# Patient Record
Sex: Male | Born: 1960
Health system: Southern US, Community
[De-identification: ages and names within clinical notes are randomized; demographics above are authoritative.]

## PROBLEM LIST (undated history)

## (undated) DIAGNOSIS — E291 Testicular hypofunction: Secondary | ICD-10-CM

## (undated) DIAGNOSIS — F200 Paranoid schizophrenia: Secondary | ICD-10-CM

## (undated) DIAGNOSIS — I10 Essential (primary) hypertension: Secondary | ICD-10-CM

## (undated) DIAGNOSIS — K2270 Barrett's esophagus without dysplasia: Secondary | ICD-10-CM

## (undated) DIAGNOSIS — M810 Age-related osteoporosis without current pathological fracture: Secondary | ICD-10-CM

## (undated) DIAGNOSIS — R413 Other amnesia: Secondary | ICD-10-CM

## (undated) DIAGNOSIS — G939 Disorder of brain, unspecified: Secondary | ICD-10-CM

## (undated) DIAGNOSIS — E119 Type 2 diabetes mellitus without complications: Secondary | ICD-10-CM

## (undated) DIAGNOSIS — R7303 Prediabetes: Secondary | ICD-10-CM

## (undated) DIAGNOSIS — K219 Gastro-esophageal reflux disease without esophagitis: Secondary | ICD-10-CM

## (undated) DIAGNOSIS — N189 Chronic kidney disease, unspecified: Secondary | ICD-10-CM

## (undated) DIAGNOSIS — R42 Dizziness and giddiness: Secondary | ICD-10-CM

## (undated) DIAGNOSIS — E785 Hyperlipidemia, unspecified: Secondary | ICD-10-CM

## (undated) DIAGNOSIS — D649 Anemia, unspecified: Secondary | ICD-10-CM

## (undated) DIAGNOSIS — F959 Tic disorder, unspecified: Secondary | ICD-10-CM

## (undated) HISTORY — DX: Disorder of brain, unspecified: G93.9

## (undated) HISTORY — DX: Barrett's esophagus without dysplasia: K22.70

## (undated) HISTORY — DX: Age-related osteoporosis without current pathological fracture: M81.0

## (undated) HISTORY — PX: COLONOSCOPY: SHX174

## (undated) HISTORY — DX: Hyperlipidemia, unspecified: E78.5

## (undated) HISTORY — DX: Type 2 diabetes mellitus without complications: E11.9

## (undated) HISTORY — DX: Essential (primary) hypertension: I10

## (undated) HISTORY — DX: Anemia, unspecified: D64.9

## (undated) HISTORY — DX: Paranoid schizophrenia: F20.0

## (undated) HISTORY — DX: Testicular hypofunction: E29.1

## (undated) HISTORY — DX: Chronic kidney disease, unspecified: N18.9

## (undated) HISTORY — DX: Gastro-esophageal reflux disease without esophagitis: K21.9

---

## 2007-10-30 ENCOUNTER — Ambulatory Visit: Payer: Self-pay | Admitting: Gastroenterology

## 2012-02-10 DIAGNOSIS — E119 Type 2 diabetes mellitus without complications: Secondary | ICD-10-CM | POA: Diagnosis not present

## 2012-02-10 DIAGNOSIS — I10 Essential (primary) hypertension: Secondary | ICD-10-CM | POA: Diagnosis not present

## 2012-02-10 DIAGNOSIS — E785 Hyperlipidemia, unspecified: Secondary | ICD-10-CM | POA: Diagnosis not present

## 2012-02-10 DIAGNOSIS — F209 Schizophrenia, unspecified: Secondary | ICD-10-CM | POA: Diagnosis not present

## 2012-06-14 DIAGNOSIS — E119 Type 2 diabetes mellitus without complications: Secondary | ICD-10-CM | POA: Diagnosis not present

## 2012-06-14 DIAGNOSIS — I10 Essential (primary) hypertension: Secondary | ICD-10-CM | POA: Diagnosis not present

## 2012-06-14 DIAGNOSIS — E785 Hyperlipidemia, unspecified: Secondary | ICD-10-CM | POA: Diagnosis not present

## 2013-02-19 DIAGNOSIS — I10 Essential (primary) hypertension: Secondary | ICD-10-CM | POA: Diagnosis not present

## 2013-02-19 DIAGNOSIS — E785 Hyperlipidemia, unspecified: Secondary | ICD-10-CM | POA: Diagnosis not present

## 2013-07-16 DIAGNOSIS — E291 Testicular hypofunction: Secondary | ICD-10-CM | POA: Diagnosis not present

## 2013-07-16 DIAGNOSIS — E119 Type 2 diabetes mellitus without complications: Secondary | ICD-10-CM | POA: Diagnosis not present

## 2013-07-16 DIAGNOSIS — I10 Essential (primary) hypertension: Secondary | ICD-10-CM | POA: Diagnosis not present

## 2013-07-16 DIAGNOSIS — Z0000 Encounter for general adult medical examination without abnormal findings: Secondary | ICD-10-CM | POA: Diagnosis not present

## 2013-07-16 DIAGNOSIS — E7800 Pure hypercholesterolemia, unspecified: Secondary | ICD-10-CM | POA: Diagnosis not present

## 2013-07-25 DIAGNOSIS — B351 Tinea unguium: Secondary | ICD-10-CM | POA: Diagnosis not present

## 2013-07-25 DIAGNOSIS — M79609 Pain in unspecified limb: Secondary | ICD-10-CM | POA: Diagnosis not present

## 2013-07-25 DIAGNOSIS — L600 Ingrowing nail: Secondary | ICD-10-CM | POA: Diagnosis not present

## 2013-10-29 DIAGNOSIS — R51 Headache: Secondary | ICD-10-CM | POA: Diagnosis not present

## 2013-10-29 DIAGNOSIS — IMO0001 Reserved for inherently not codable concepts without codable children: Secondary | ICD-10-CM | POA: Diagnosis not present

## 2013-10-29 DIAGNOSIS — E785 Hyperlipidemia, unspecified: Secondary | ICD-10-CM | POA: Diagnosis not present

## 2013-10-29 DIAGNOSIS — R55 Syncope and collapse: Secondary | ICD-10-CM | POA: Diagnosis not present

## 2014-01-15 DIAGNOSIS — Z23 Encounter for immunization: Secondary | ICD-10-CM | POA: Diagnosis not present

## 2014-08-21 ENCOUNTER — Other Ambulatory Visit: Payer: Self-pay | Admitting: Unknown Physician Specialty

## 2014-08-21 ENCOUNTER — Ambulatory Visit
Admission: RE | Admit: 2014-08-21 | Discharge: 2014-08-21 | Disposition: A | Discharge status: Home / Self Care | Payer: Medicare Other | Source: Ambulatory Visit | Attending: Unknown Physician Specialty | Admitting: Unknown Physician Specialty

## 2014-08-21 DIAGNOSIS — M1712 Unilateral primary osteoarthritis, left knee: Secondary | ICD-10-CM | POA: Diagnosis not present

## 2014-08-21 DIAGNOSIS — E119 Type 2 diabetes mellitus without complications: Secondary | ICD-10-CM | POA: Diagnosis not present

## 2014-08-21 DIAGNOSIS — M795 Residual foreign body in soft tissue: Secondary | ICD-10-CM | POA: Diagnosis not present

## 2014-08-21 DIAGNOSIS — M25562 Pain in left knee: Secondary | ICD-10-CM

## 2014-08-21 DIAGNOSIS — R358 Other polyuria: Secondary | ICD-10-CM | POA: Diagnosis not present

## 2014-08-21 DIAGNOSIS — G9009 Other idiopathic peripheral autonomic neuropathy: Secondary | ICD-10-CM | POA: Diagnosis not present

## 2014-08-21 DIAGNOSIS — E785 Hyperlipidemia, unspecified: Secondary | ICD-10-CM | POA: Diagnosis not present

## 2014-08-21 DIAGNOSIS — Z125 Encounter for screening for malignant neoplasm of prostate: Secondary | ICD-10-CM | POA: Diagnosis not present

## 2014-08-21 DIAGNOSIS — F0390 Unspecified dementia without behavioral disturbance: Secondary | ICD-10-CM | POA: Diagnosis not present

## 2014-08-21 DIAGNOSIS — I10 Essential (primary) hypertension: Secondary | ICD-10-CM | POA: Diagnosis not present

## 2014-08-21 DIAGNOSIS — N419 Inflammatory disease of prostate, unspecified: Secondary | ICD-10-CM | POA: Diagnosis not present

## 2014-09-11 DIAGNOSIS — E119 Type 2 diabetes mellitus without complications: Secondary | ICD-10-CM | POA: Diagnosis not present

## 2014-09-11 LAB — HM DIABETES EYE EXAM

## 2014-09-17 DIAGNOSIS — R42 Dizziness and giddiness: Secondary | ICD-10-CM | POA: Diagnosis not present

## 2014-09-17 DIAGNOSIS — R519 Headache, unspecified: Secondary | ICD-10-CM | POA: Insufficient documentation

## 2014-09-17 DIAGNOSIS — R413 Other amnesia: Secondary | ICD-10-CM | POA: Diagnosis not present

## 2014-09-17 DIAGNOSIS — R296 Repeated falls: Secondary | ICD-10-CM | POA: Insufficient documentation

## 2014-09-17 DIAGNOSIS — R51 Headache: Secondary | ICD-10-CM | POA: Diagnosis not present

## 2014-09-19 DIAGNOSIS — G939 Disorder of brain, unspecified: Secondary | ICD-10-CM | POA: Insufficient documentation

## 2014-09-19 DIAGNOSIS — E119 Type 2 diabetes mellitus without complications: Secondary | ICD-10-CM | POA: Insufficient documentation

## 2014-09-19 DIAGNOSIS — D649 Anemia, unspecified: Secondary | ICD-10-CM | POA: Insufficient documentation

## 2014-09-19 DIAGNOSIS — K2270 Barrett's esophagus without dysplasia: Secondary | ICD-10-CM | POA: Insufficient documentation

## 2014-09-19 DIAGNOSIS — M810 Age-related osteoporosis without current pathological fracture: Secondary | ICD-10-CM

## 2014-09-19 DIAGNOSIS — M1990 Unspecified osteoarthritis, unspecified site: Secondary | ICD-10-CM | POA: Insufficient documentation

## 2014-09-19 DIAGNOSIS — E785 Hyperlipidemia, unspecified: Secondary | ICD-10-CM | POA: Insufficient documentation

## 2014-09-19 DIAGNOSIS — F200 Paranoid schizophrenia: Secondary | ICD-10-CM | POA: Insufficient documentation

## 2014-09-19 DIAGNOSIS — N189 Chronic kidney disease, unspecified: Secondary | ICD-10-CM | POA: Insufficient documentation

## 2014-09-19 DIAGNOSIS — I10 Essential (primary) hypertension: Secondary | ICD-10-CM | POA: Insufficient documentation

## 2014-09-24 ENCOUNTER — Ambulatory Visit (INDEPENDENT_AMBULATORY_CARE_PROVIDER_SITE_OTHER): Payer: Medicare Other | Admitting: Unknown Physician Specialty

## 2014-09-24 ENCOUNTER — Encounter: Payer: Self-pay | Admitting: Unknown Physician Specialty

## 2014-09-24 VITALS — BP 125/79 | HR 69 | Temp 98.5°F | Ht 68.0 in | Wt 193.4 lb

## 2014-09-24 DIAGNOSIS — R358 Other polyuria: Secondary | ICD-10-CM

## 2014-09-24 DIAGNOSIS — R413 Other amnesia: Secondary | ICD-10-CM | POA: Diagnosis not present

## 2014-09-24 DIAGNOSIS — M25562 Pain in left knee: Secondary | ICD-10-CM | POA: Diagnosis not present

## 2014-09-24 DIAGNOSIS — R3589 Other polyuria: Secondary | ICD-10-CM | POA: Insufficient documentation

## 2014-09-24 NOTE — Progress Notes (Signed)
   BP 125/79 mmHg  Pulse 69  Temp(Src) 98.5 F (36.9 C) (Oral)  Ht 5\' 8"  (1.727 m)  Wt 193 lb 6.4 oz (87.726 kg)  BMI 29.41 kg/m2  SpO2 93%   Subjective:    Patient ID: George Glenn Seen, male    DOB: 11/24/1960, 54 y.o.   MRN: [id number]  HPI: George Glenn is a 54 y.o. male presenting on 09/24/2014 for Follow-up for care coordination  Knee pain: X-ray showed possibility of fob and referred to orthopedics.  That appointment is tomorrow  Memory loss: Saw neurology.  Risperidol has been cut back.  Benzodiazepines completely discontinued.  No change but more alert.  No signs of Schizophrenia with the changes.    Frequent urination: Continues.  No change with the antibiotics.  No reason for frequent urination besides drinking a lot of water.     Relevant past medical, surgical, family and social history reviewed and updated as indicated. Interim medical history since our last visit reviewed. Allergies and medications reviewed and updated.  Review of Systems  Per HPI unless specifically indicated above     Objective:    BP 125/79 mmHg  Pulse 69  Temp(Src) 98.5 F (36.9 C) (Oral)  Ht 5\' 8"  (1.727 m)  Wt 193 lb 6.4 oz (87.726 kg)  BMI 29.41 kg/m2  SpO2 93%  Wt Readings from Last 3 Encounters:  09/24/14 193 lb 6.4 oz (87.726 kg)  08/21/14 193 lb (87.544 kg)  08/21/14 193 lb (87.544 kg)    Physical Exam  Constitutional: He appears well-developed and well-nourished. No distress.  HENT:  Head: Normocephalic and atraumatic.  Right Ear: Tympanic membrane and ear canal normal.  Left Ear: Tympanic membrane and ear canal normal.  Eyes: Conjunctivae and lids are normal. Right eye exhibits no discharge. Left eye exhibits no discharge. No scleral icterus.  Cardiovascular: Normal rate and regular rhythm.   Pulmonary/Chest: Effort normal. No respiratory distress.  Abdominal: Normal appearance. He exhibits no distension. There is no splenomegaly or hepatomegaly. There is no  tenderness.  Musculoskeletal: Normal range of motion.  Neurological: He is unresponsive.  Verbally unresponsive though looking at me when I talk.    Skin: Skin is intact. No rash noted. No pallor.       Assessment & Plan:     Problem List Items Addressed This Visit      Genitourinary   Polyuria     Other   Knee pain, left - Primary   Memory loss       Follow up plan: Pt is total care and requiring help with hygiene and food preparation.  Discussed with sister about an aid 4 hours a day.  F/u with Orthopedics is tomorrow.  Followed by neurology.  Polyuria seems to be due to fluid intake.  Labs are normal.  Lipid panel and CMP in 6 months

## 2014-09-25 DIAGNOSIS — M1712 Unilateral primary osteoarthritis, left knee: Secondary | ICD-10-CM | POA: Diagnosis not present

## 2014-10-14 DIAGNOSIS — H25041 Posterior subcapsular polar age-related cataract, right eye: Secondary | ICD-10-CM | POA: Diagnosis not present

## 2014-10-22 DIAGNOSIS — R51 Headache: Secondary | ICD-10-CM | POA: Diagnosis not present

## 2014-10-22 DIAGNOSIS — R413 Other amnesia: Secondary | ICD-10-CM | POA: Diagnosis not present

## 2014-10-22 DIAGNOSIS — R42 Dizziness and giddiness: Secondary | ICD-10-CM | POA: Diagnosis not present

## 2014-10-22 DIAGNOSIS — R296 Repeated falls: Secondary | ICD-10-CM | POA: Diagnosis not present

## 2014-11-04 ENCOUNTER — Other Ambulatory Visit: Payer: Medicaid Other

## 2014-11-04 DIAGNOSIS — H25041 Posterior subcapsular polar age-related cataract, right eye: Secondary | ICD-10-CM | POA: Diagnosis not present

## 2014-11-05 ENCOUNTER — Encounter: Payer: Self-pay | Admitting: *Deleted

## 2014-11-05 DIAGNOSIS — K219 Gastro-esophageal reflux disease without esophagitis: Secondary | ICD-10-CM | POA: Diagnosis not present

## 2014-11-05 DIAGNOSIS — E119 Type 2 diabetes mellitus without complications: Secondary | ICD-10-CM | POA: Diagnosis not present

## 2014-11-05 DIAGNOSIS — E780 Pure hypercholesterolemia: Secondary | ICD-10-CM | POA: Diagnosis not present

## 2014-11-05 DIAGNOSIS — H2511 Age-related nuclear cataract, right eye: Secondary | ICD-10-CM | POA: Diagnosis not present

## 2014-11-05 DIAGNOSIS — Z79899 Other long term (current) drug therapy: Secondary | ICD-10-CM | POA: Diagnosis not present

## 2014-11-05 DIAGNOSIS — D649 Anemia, unspecified: Secondary | ICD-10-CM | POA: Diagnosis not present

## 2014-11-05 DIAGNOSIS — K2270 Barrett's esophagus without dysplasia: Secondary | ICD-10-CM | POA: Diagnosis not present

## 2014-11-05 DIAGNOSIS — I10 Essential (primary) hypertension: Secondary | ICD-10-CM | POA: Diagnosis not present

## 2014-11-05 DIAGNOSIS — N289 Disorder of kidney and ureter, unspecified: Secondary | ICD-10-CM | POA: Diagnosis not present

## 2014-11-05 DIAGNOSIS — F209 Schizophrenia, unspecified: Secondary | ICD-10-CM | POA: Diagnosis not present

## 2014-11-12 ENCOUNTER — Ambulatory Visit
Admission: RE | Admit: 2014-11-12 | Discharge: 2014-11-12 | Disposition: A | Discharge status: Home / Self Care | Payer: Medicare Other | Source: Ambulatory Visit | Attending: Ophthalmology | Admitting: Ophthalmology

## 2014-11-12 ENCOUNTER — Encounter: Payer: Self-pay | Admitting: *Deleted

## 2014-11-12 ENCOUNTER — Ambulatory Visit: Payer: Medicare Other | Admitting: Anesthesiology

## 2014-11-12 ENCOUNTER — Encounter
Admission: RE | Disposition: A | Discharge status: Home / Self Care | Payer: Self-pay | Source: Ambulatory Visit | Attending: Ophthalmology

## 2014-11-12 DIAGNOSIS — E119 Type 2 diabetes mellitus without complications: Secondary | ICD-10-CM | POA: Insufficient documentation

## 2014-11-12 DIAGNOSIS — Z79899 Other long term (current) drug therapy: Secondary | ICD-10-CM | POA: Insufficient documentation

## 2014-11-12 DIAGNOSIS — H2511 Age-related nuclear cataract, right eye: Secondary | ICD-10-CM | POA: Insufficient documentation

## 2014-11-12 DIAGNOSIS — F209 Schizophrenia, unspecified: Secondary | ICD-10-CM | POA: Insufficient documentation

## 2014-11-12 DIAGNOSIS — D649 Anemia, unspecified: Secondary | ICD-10-CM | POA: Diagnosis not present

## 2014-11-12 DIAGNOSIS — N289 Disorder of kidney and ureter, unspecified: Secondary | ICD-10-CM | POA: Diagnosis not present

## 2014-11-12 DIAGNOSIS — E780 Pure hypercholesterolemia: Secondary | ICD-10-CM | POA: Insufficient documentation

## 2014-11-12 DIAGNOSIS — I10 Essential (primary) hypertension: Secondary | ICD-10-CM | POA: Insufficient documentation

## 2014-11-12 DIAGNOSIS — K219 Gastro-esophageal reflux disease without esophagitis: Secondary | ICD-10-CM | POA: Insufficient documentation

## 2014-11-12 DIAGNOSIS — H25041 Posterior subcapsular polar age-related cataract, right eye: Secondary | ICD-10-CM | POA: Diagnosis not present

## 2014-11-12 DIAGNOSIS — K2270 Barrett's esophagus without dysplasia: Secondary | ICD-10-CM | POA: Insufficient documentation

## 2014-11-12 HISTORY — DX: Tic disorder, unspecified: F95.9

## 2014-11-12 HISTORY — DX: Dizziness and giddiness: R42

## 2014-11-12 HISTORY — PX: CATARACT EXTRACTION W/PHACO: SHX586

## 2014-11-12 LAB — GLUCOSE, CAPILLARY: Glucose-Capillary: 93 mg/dL (ref 65–99)

## 2014-11-12 SURGERY — PHACOEMULSIFICATION, CATARACT, WITH IOL INSERTION
Anesthesia: Monitor Anesthesia Care | Laterality: Right

## 2014-11-12 MED ORDER — NA CHONDROIT SULF-NA HYALURON 40-17 MG/ML IO SOLN
INTRAOCULAR | Status: AC
  Filled 2014-11-12: qty 1

## 2014-11-12 MED ORDER — MOXIFLOXACIN HCL 0.5 % OP SOLN
OPHTHALMIC | Status: DC | PRN
  Administered 2014-11-12: 2 [drp] via OPHTHALMIC

## 2014-11-12 MED ORDER — ARMC OPHTHALMIC DILATING GEL
1.0000 "application " | OPHTHALMIC | Status: DC | PRN
  Administered 2014-11-12: 1 via OPHTHALMIC

## 2014-11-12 MED ORDER — EPINEPHRINE HCL 1 MG/ML IJ SOLN
INTRAMUSCULAR | Status: AC
  Filled 2014-11-12: qty 2

## 2014-11-12 MED ORDER — POVIDONE-IODINE 5 % OP SOLN
1.0000 "application " | OPHTHALMIC | Status: AC | PRN
  Administered 2014-11-12: 1 via OPHTHALMIC

## 2014-11-12 MED ORDER — MOXIFLOXACIN HCL 0.5 % OP SOLN: 1.0000 [drp] | Freq: Once | OPHTHALMIC | Status: DC

## 2014-11-12 MED ORDER — FENTANYL CITRATE (PF) 100 MCG/2ML IJ SOLN
INTRAMUSCULAR | Status: DC | PRN
  Administered 2014-11-12: 25 ug via INTRAVENOUS

## 2014-11-12 MED ORDER — LIDOCAINE HCL (PF) 4 % IJ SOLN
INTRAMUSCULAR | Status: AC
  Filled 2014-11-12: qty 5

## 2014-11-12 MED ORDER — TETRACAINE HCL 0.5 % OP SOLN
OPHTHALMIC | Status: AC
  Administered 2014-11-12: 1 [drp] via OPHTHALMIC
  Filled 2014-11-12: qty 2

## 2014-11-12 MED ORDER — CEFUROXIME OPHTHALMIC INJECTION 1 MG/0.1 ML
INJECTION | OPHTHALMIC | Status: DC | PRN
  Administered 2014-11-12: .1 mL via INTRACAMERAL

## 2014-11-12 MED ORDER — SODIUM CHLORIDE 0.9 % IV SOLN
INTRAVENOUS | Status: DC
  Administered 2014-11-12: 10:00:00 via INTRAVENOUS

## 2014-11-12 MED ORDER — CARBACHOL 0.01 % IO SOLN
INTRAOCULAR | Status: DC | PRN
  Administered 2014-11-12: .5 mL via INTRAOCULAR

## 2014-11-12 MED ORDER — CEFUROXIME OPHTHALMIC INJECTION 1 MG/0.1 ML
INJECTION | OPHTHALMIC | Status: AC
  Filled 2014-11-12: qty 0.1

## 2014-11-12 MED ORDER — TETRACAINE HCL 0.5 % OP SOLN
1.0000 [drp] | OPHTHALMIC | Status: AC | PRN
  Administered 2014-11-12: 1 [drp] via OPHTHALMIC

## 2014-11-12 MED ORDER — POVIDONE-IODINE 5 % OP SOLN
OPHTHALMIC | Status: AC
  Administered 2014-11-12: 1 via OPHTHALMIC
  Filled 2014-11-12: qty 30

## 2014-11-12 MED ORDER — LIDOCAINE HCL (PF) 4 % IJ SOLN
INTRAMUSCULAR | Status: DC | PRN
Start: 2014-11-12 — End: 2014-11-12
  Administered 2014-11-12: 1 mL

## 2014-11-12 MED ORDER — EPINEPHRINE HCL 1 MG/ML IJ SOLN
INTRAOCULAR | Status: DC | PRN
  Administered 2014-11-12: 250 mL

## 2014-11-12 MED ORDER — MOXIFLOXACIN HCL 0.5 % OP SOLN
OPHTHALMIC | Status: AC
  Filled 2014-11-12: qty 3

## 2014-11-12 MED ORDER — MIDAZOLAM HCL 5 MG/5ML IJ SOLN
INTRAMUSCULAR | Status: DC | PRN
  Administered 2014-11-12: 1.5 mg via INTRAVENOUS

## 2014-11-12 SURGICAL SUPPLY — 22 items
CANNULA ANT/CHMB 27GA (MISCELLANEOUS) ×4 IMPLANT
CUP MEDICINE 2OZ PLAST GRAD ST (MISCELLANEOUS) ×2 IMPLANT
GLOVE BIO SURGEON STRL SZ8 (GLOVE) ×4 IMPLANT
GLOVE BIOGEL M 6.5 STRL (GLOVE) ×2 IMPLANT
GLOVE SURG LX 8.0 MICRO (GLOVE) ×1
GLOVE SURG LX STRL 8.0 MICRO (GLOVE) ×1 IMPLANT
GOWN STRL REUS W/ TWL LRG LVL3 (GOWN DISPOSABLE) ×2 IMPLANT
GOWN STRL REUS W/TWL LRG LVL3 (GOWN DISPOSABLE) ×2
LENS IOL TECNIS 19.0 (Intraocular Lens) ×2 IMPLANT
LENS IOL TECNIS MONO 1P 19.0 (Intraocular Lens) ×1 IMPLANT
PACK CATARACT (MISCELLANEOUS) ×2 IMPLANT
PACK CATARACT BRASINGTON LX (MISCELLANEOUS) ×2 IMPLANT
PACK EYE AFTER SURG (MISCELLANEOUS) ×2 IMPLANT
SOL BSS BAG (MISCELLANEOUS) ×2
SOL PREP PVP 2OZ (MISCELLANEOUS)
SOLUTION BSS BAG (MISCELLANEOUS) ×1 IMPLANT
SOLUTION PREP PVP 2OZ (MISCELLANEOUS) IMPLANT
SYR 3ML LL SCALE MARK (SYRINGE) ×2 IMPLANT
SYR 5ML LL (SYRINGE) ×2 IMPLANT
SYR TB 1ML 27GX1/2 LL (SYRINGE) ×2 IMPLANT
WATER STERILE IRR 1000ML POUR (IV SOLUTION) ×2 IMPLANT
WIPE NON LINTING 3.25X3.25 (MISCELLANEOUS) ×2 IMPLANT

## 2014-11-12 NOTE — Discharge Instructions (Signed)
AMBULATORY SURGERY  °DISCHARGE INSTRUCTIONS ° ° °1) The drugs that you were given will stay in your system until tomorrow so for the next 24 hours you should not: ° °A) Drive an automobile °B) Make any legal decisions °C) Drink any alcoholic beverage ° ° °2) You may resume regular meals tomorrow.  Today it is better to start with liquids and gradually work up to solid foods. ° °You may eat anything you prefer, but it is better to start with liquids, then soup and crackers, and gradually work up to solid foods. ° ° °3) Please notify your doctor immediately if you have any unusual bleeding, trouble breathing, redness and pain at the surgery site, drainage, fever, or pain not relieved by medication. ° ° ° °4) Additional Instructions: ° ° ° °Cataract Surgery °Care After °Refer to this sheet in the next few weeks. These instructions provide you with information on caring for yourself after your procedure. Your caregiver may also give you more specific instructions. Your treatment has been planned according to current medical practices, but problems sometimes occur. Call your caregiver if you have any problems or questions after your procedure.  °HOME CARE INSTRUCTIONS  °· Avoid strenuous activities as directed by your caregiver. °· Ask your caregiver when you can resume driving. °· Use eyedrops or other medicines to help healing and control pressure inside your eye as directed by your caregiver. °· Only take over-the-counter or prescription medicines for pain, discomfort, or fever as directed by your caregiver. °· Do not to touch or rub your eyes. °· You may be instructed to use a protective shield during the first few days and nights after surgery. If not, wear sunglasses to protect your eyes. This is to protect the eye from pressure or from being accidentally bumped. °· Keep the area around your eye clean and dry. Avoid swimming or allowing water to hit you directly in the face while showering. Keep soap and shampoo  out of your eyes. °· Do not bend or lift heavy objects. Bending increases pressure in the eye. You can walk, climb stairs, and do light household chores. °· Do not put a contact lens into the eye that had surgery until your caregiver says it is okay to do so. °· Ask your doctor when you can return to work. This will depend on the kind of work that you do. If you work in a dusty environment, you may be advised to wear protective eyewear for a period of time. °· Ask your caregiver when it will be safe to engage in sexual activity. °· Continue with your regular eye exams as directed by your caregiver. °What to expect: °· It is normal to feel itching and mild discomfort for a few days after cataract surgery. Some fluid discharge is also common, and your eye may be sensitive to light and touch. °· After 1 to 2 days, even moderate discomfort should disappear. In most cases, healing will take about 6 weeks. °· If you received an intraocular lens (IOL), you may notice that colors are very bright or have a blue tinge. Also, if you have been in bright sunlight, everything may appear reddish for a few hours. If you see these color tinges, it is because your lens is clear and no longer cloudy. Within a few months after receiving an IOL, these extra colors should go away. When you have healed, you will probably need new glasses. °SEEK MEDICAL CARE IF:  °· You have increased bruising around your   eye. °· You have discomfort not helped by medicine. °SEEK IMMEDIATE MEDICAL CARE IF:  °· You have a  fever. °· You have a worsening or sudden vision loss. °· You have redness, swelling, or increasing pain in the eye. °· You have a thick discharge from the eye that had surgery. °MAKE SURE YOU: °· Understand these instructions. °· Will watch your condition. °· Will get help right away if you are not doing well or get worse. °Document Released: 10/23/2004 Document Revised: 06/28/2011 Document Reviewed: 11/27/2010 °[employer]® Patient  Information ©2015 [employer]. This information is not intended to replace advice given to you by your health care provider. Make sure you discuss any questions you have with your health care provider. ° ° ° ° °Please contact your physician with any problems or Same Day Surgery at [phone], Monday through Friday 6 am to 4 pm, or [facility] number at [phone]. °

## 2014-11-12 NOTE — Op Note (Signed)
PREOPERATIVE DIAGNOSIS:  Nuclear sclerotic cataract of the right eye.   POSTOPERATIVE DIAGNOSIS: nuclear sclerotic cataract right eye   OPERATIVE PROCEDURE:  Procedure(s): CATARACT EXTRACTION PHACO AND INTRAOCULAR LENS PLACEMENT (IOC)   SURGEON:  Birder Robson, MD.   ANESTHESIA:  Anesthesiologist: Molli Barrows, MD CRNA: Bernardo Heater, CRNA  1.      Managed anesthesia care. 2.      Topical tetracaine drops followed by 2% Xylocaine jelly applied in the preoperative holding area.   COMPLICATIONS:  None.   TECHNIQUE:   Stop and chop   DESCRIPTION OF PROCEDURE:  The patient was examined and consented in the preoperative holding area where the aforementioned topical anesthesia was applied to the right eye and then brought back to the Operating Room where the right eye was prepped and draped in the usual sterile ophthalmic fashion and a lid speculum was placed. A paracentesis was created with the side port blade and the anterior chamber was filled with viscoelastic. A near clear corneal incision was performed with the steel keratome. A continuous curvilinear capsulorrhexis was performed with a cystotome followed by the capsulorrhexis forceps. Hydrodissection and hydrodelineation were carried out with BSS on a blunt cannula. The lens was removed in a stop and chop  technique and the remaining cortical material was removed with the irrigation-aspiration handpiece. The capsular bag was inflated with viscoelastic and the Technis ZCB00  lens was placed in the capsular bag without complication. The remaining viscoelastic was removed from the eye with the irrigation-aspiration handpiece. The wounds were hydrated. The anterior chamber was flushed with Miostat and the eye was inflated to physiologic pressure. 0.1 mL of cefuroxime concentration 10 mg/mL was placed in the anterior chamber. The wounds were found to be water tight. The eye was dressed with Vigamox. The patient was given protective glasses to  wear throughout the day and a shield with which to sleep tonight. The patient was also given drops with which to begin a drop regimen today and will follow-up with me in one day.  Implant Name Type Inv. Item Serial No. Manufacturer Lot No. LRB No. Used  LENS IMPL INTRAOC ZCB00 19.0 - [id number] Intraocular Lens LENS IMPL INTRAOC ZCB00 19.0 [id number]   Right 1   Procedure(s) with comments: CATARACT EXTRACTION PHACO AND INTRAOCULAR LENS PLACEMENT (IOC) (Right) - cassette Lot # [id number]     [place]  00:45 AP 14.7 CDE 6.62  Electronically signed: An Lannan LOUIS 11/12/2014 10:46 AM

## 2014-11-12 NOTE — H&P (Signed)
All labs reviewed. Abnormal studies sent to patients PCP when indicated.  Previous H&P reviewed, patient examined, there are NO CHANGES.  [name]7/26/20161:38 PM

## 2014-11-12 NOTE — Anesthesia Preprocedure Evaluation (Signed)
Anesthesia Evaluation  Patient identified by MRN, date of birth, ID band Patient awake    Reviewed: Allergy & Precautions, H&P , NPO status , Patient's Chart, lab work & pertinent test results, reviewed documented beta blocker date and time   Airway Mallampati: II  TM Distance: >3 FB Neck ROM: full    Dental no notable dental hx. (+) Teeth Intact   Pulmonary neg pulmonary ROS,  breath sounds clear to auscultation  Pulmonary exam normal       Cardiovascular Exercise Tolerance: Good hypertension, negative cardio ROS  Rhythm:regular Rate:Normal     Neuro/Psych PSYCHIATRIC DISORDERS Schizophrenia negative neurological ROS  negative psych ROS   GI/Hepatic negative GI ROS, Neg liver ROS, GERD-  ,  Endo/Other  negative endocrine ROSdiabetes  Renal/GU Renal disease     Musculoskeletal   Abdominal   Peds  Hematology negative hematology ROS (+) anemia ,   Anesthesia Other Findings   Reproductive/Obstetrics negative OB ROS                             Anesthesia Physical Anesthesia Plan  ASA: III  Anesthesia Plan: MAC   Post-op Pain Management:    Induction:   Airway Management Planned:   Additional Equipment:   Intra-op Plan:   Post-operative Plan:   Informed Consent: I have reviewed the patients History and Physical, chart, labs and discussed the procedure including the risks, benefits and alternatives for the proposed anesthesia with the patient or authorized representative who has indicated his/her understanding and acceptance.     Plan Discussed with: CRNA  Anesthesia Plan Comments:         Anesthesia Quick Evaluation

## 2014-11-12 NOTE — Anesthesia Postprocedure Evaluation (Signed)
  Anesthesia Post-op Note  Patient: George Glenn  Procedure(s) Performed: Procedure(s) with comments: CATARACT EXTRACTION PHACO AND INTRAOCULAR LENS PLACEMENT (IOC) (Right) - cassette Lot # [id number]     [place]  00:45 AP 14.7 CDE 6.62  Anesthesia type:MAC  Patient location: PACU  Post pain: Pain level controlled  Post assessment: Post-op Vital signs reviewed, Patient's Cardiovascular Status Stable, Respiratory Function Stable, Patent Airway and No signs of Nausea or vomiting  Post vital signs: Reviewed and stable  Last Vitals:  Filed Vitals:   11/12/14 1049  BP: 118/88  Pulse:   Temp: 36.9 C  Resp: 16    Level of consciousness: awake, alert  and patient cooperative  Complications: No apparent anesthesia complications

## 2014-11-12 NOTE — H&P (Signed)
  All labs reviewed. Abnormal studies sent to patients PCP when indicated.  Previous H&P reviewed, patient examined, there are NO CHANGES.  George Glenn LOUIS7/26/201610:19 AM

## 2014-11-12 NOTE — Transfer of Care (Signed)
Immediate Anesthesia Transfer of Care Note  Patient: George Glenn  Procedure(s) Performed: Procedure(s) with comments: CATARACT EXTRACTION PHACO AND INTRAOCULAR LENS PLACEMENT (IOC) (Right) - cassette Lot # [id number] H     [place]  00:45 AP 14.7 CDE 6.62  Patient Location: PACU  Anesthesia Type:MAC  Level of Consciousness: awake, alert , oriented and patient cooperative  Airway & Oxygen Therapy: Patient Spontanous Breathing  Post-op Assessment: Report given to RN and Post -op Vital signs reviewed and stable  Post vital signs: Reviewed and stable  Last Vitals:  Filed Vitals:   11/12/14 1049  BP: 118/88  Pulse:   Temp: 36.9 C  Resp: 16    Complications: No apparent anesthesia complications

## 2014-11-27 DIAGNOSIS — H25042 Posterior subcapsular polar age-related cataract, left eye: Secondary | ICD-10-CM | POA: Diagnosis not present

## 2014-12-03 ENCOUNTER — Encounter: Payer: Self-pay | Admitting: *Deleted

## 2014-12-10 ENCOUNTER — Encounter
Admission: RE | Disposition: A | Discharge status: Home / Self Care | Payer: Self-pay | Source: Ambulatory Visit | Attending: Ophthalmology

## 2014-12-10 ENCOUNTER — Ambulatory Visit: Payer: Medicare Other | Admitting: Anesthesiology

## 2014-12-10 ENCOUNTER — Encounter: Payer: Self-pay | Admitting: *Deleted

## 2014-12-10 ENCOUNTER — Ambulatory Visit
Admission: RE | Admit: 2014-12-10 | Discharge: 2014-12-10 | Disposition: A | Discharge status: Home / Self Care | Payer: Medicare Other | Source: Ambulatory Visit | Attending: Ophthalmology | Admitting: Ophthalmology

## 2014-12-10 DIAGNOSIS — F958 Other tic disorders: Secondary | ICD-10-CM | POA: Insufficient documentation

## 2014-12-10 DIAGNOSIS — M1990 Unspecified osteoarthritis, unspecified site: Secondary | ICD-10-CM | POA: Diagnosis not present

## 2014-12-10 DIAGNOSIS — D649 Anemia, unspecified: Secondary | ICD-10-CM | POA: Insufficient documentation

## 2014-12-10 DIAGNOSIS — E780 Pure hypercholesterolemia: Secondary | ICD-10-CM | POA: Diagnosis not present

## 2014-12-10 DIAGNOSIS — K2270 Barrett's esophagus without dysplasia: Secondary | ICD-10-CM | POA: Insufficient documentation

## 2014-12-10 DIAGNOSIS — R42 Dizziness and giddiness: Secondary | ICD-10-CM | POA: Diagnosis not present

## 2014-12-10 DIAGNOSIS — Z79899 Other long term (current) drug therapy: Secondary | ICD-10-CM | POA: Insufficient documentation

## 2014-12-10 DIAGNOSIS — E119 Type 2 diabetes mellitus without complications: Secondary | ICD-10-CM | POA: Insufficient documentation

## 2014-12-10 DIAGNOSIS — F209 Schizophrenia, unspecified: Secondary | ICD-10-CM | POA: Diagnosis not present

## 2014-12-10 DIAGNOSIS — H2512 Age-related nuclear cataract, left eye: Secondary | ICD-10-CM | POA: Insufficient documentation

## 2014-12-10 DIAGNOSIS — I10 Essential (primary) hypertension: Secondary | ICD-10-CM | POA: Diagnosis not present

## 2014-12-10 DIAGNOSIS — K219 Gastro-esophageal reflux disease without esophagitis: Secondary | ICD-10-CM | POA: Insufficient documentation

## 2014-12-10 DIAGNOSIS — H269 Unspecified cataract: Secondary | ICD-10-CM | POA: Diagnosis not present

## 2014-12-10 DIAGNOSIS — M810 Age-related osteoporosis without current pathological fracture: Secondary | ICD-10-CM | POA: Diagnosis not present

## 2014-12-10 DIAGNOSIS — R413 Other amnesia: Secondary | ICD-10-CM | POA: Diagnosis not present

## 2014-12-10 DIAGNOSIS — Z9841 Cataract extraction status, right eye: Secondary | ICD-10-CM | POA: Insufficient documentation

## 2014-12-10 DIAGNOSIS — H25042 Posterior subcapsular polar age-related cataract, left eye: Secondary | ICD-10-CM | POA: Diagnosis not present

## 2014-12-10 HISTORY — DX: Other amnesia: R41.3

## 2014-12-10 HISTORY — PX: CATARACT EXTRACTION W/PHACO: SHX586

## 2014-12-10 LAB — GLUCOSE, CAPILLARY: GLUCOSE-CAPILLARY: 103 mg/dL — AB (ref 65–99)

## 2014-12-10 SURGERY — PHACOEMULSIFICATION, CATARACT, WITH IOL INSERTION
Anesthesia: Monitor Anesthesia Care | Site: Eye | Laterality: Left | Wound class: Clean

## 2014-12-10 MED ORDER — ARMC OPHTHALMIC DILATING GEL
OPHTHALMIC | Status: AC
  Administered 2014-12-10: 1 via OPHTHALMIC
  Filled 2014-12-10: qty 0.25

## 2014-12-10 MED ORDER — SODIUM CHLORIDE 0.9 % IV SOLN
INTRAVENOUS | Status: DC
  Administered 2014-12-10 (×2): via INTRAVENOUS

## 2014-12-10 MED ORDER — NA CHONDROIT SULF-NA HYALURON 40-17 MG/ML IO SOLN
INTRAOCULAR | Status: DC | PRN
  Administered 2014-12-10: 1 mL via INTRAOCULAR

## 2014-12-10 MED ORDER — FENTANYL CITRATE (PF) 100 MCG/2ML IJ SOLN
INTRAMUSCULAR | Status: DC | PRN
  Administered 2014-12-10: 50 ug via INTRAVENOUS

## 2014-12-10 MED ORDER — MOXIFLOXACIN HCL 0.5 % OP SOLN
OPHTHALMIC | Status: AC
  Filled 2014-12-10: qty 3

## 2014-12-10 MED ORDER — NA CHONDROIT SULF-NA HYALURON 40-17 MG/ML IO SOLN
INTRAOCULAR | Status: AC
  Filled 2014-12-10: qty 1

## 2014-12-10 MED ORDER — BSS IO SOLN
INTRAOCULAR | Status: DC | PRN
Start: 2014-12-10 — End: 2014-12-10
  Administered 2014-12-10: 4 mL via OPHTHALMIC

## 2014-12-10 MED ORDER — MIDAZOLAM HCL 2 MG/2ML IJ SOLN
INTRAMUSCULAR | Status: DC | PRN
  Administered 2014-12-10: 1 mg via INTRAVENOUS

## 2014-12-10 MED ORDER — MOXIFLOXACIN HCL 0.5 % OP SOLN: 1.0000 [drp] | Freq: Once | OPHTHALMIC | Status: DC

## 2014-12-10 MED ORDER — LIDOCAINE HCL (PF) 4 % IJ SOLN
INTRAMUSCULAR | Status: AC
  Filled 2014-12-10: qty 5

## 2014-12-10 MED ORDER — POVIDONE-IODINE 5 % OP SOLN
OPHTHALMIC | Status: AC
  Administered 2014-12-10: 1 via OPHTHALMIC
  Filled 2014-12-10: qty 30

## 2014-12-10 MED ORDER — ARMC OPHTHALMIC DILATING GEL
1.0000 "application " | OPHTHALMIC | Status: AC
  Administered 2014-12-10 (×2): 1 via OPHTHALMIC

## 2014-12-10 MED ORDER — MOXIFLOXACIN HCL 0.5 % OP SOLN
OPHTHALMIC | Status: DC | PRN
Start: 2014-12-10 — End: 2014-12-10
  Administered 2014-12-10: 1 [drp] via OPHTHALMIC

## 2014-12-10 MED ORDER — TETRACAINE HCL 0.5 % OP SOLN
1.0000 [drp] | Freq: Once | OPHTHALMIC | Status: AC
  Administered 2014-12-10: 1 [drp] via OPHTHALMIC

## 2014-12-10 MED ORDER — POVIDONE-IODINE 5 % OP SOLN
1.0000 "application " | Freq: Once | OPHTHALMIC | Status: AC
  Administered 2014-12-10: 1 via OPHTHALMIC

## 2014-12-10 MED ORDER — CEFUROXIME OPHTHALMIC INJECTION 1 MG/0.1 ML
INJECTION | OPHTHALMIC | Status: AC
  Filled 2014-12-10: qty 0.1

## 2014-12-10 MED ORDER — EPINEPHRINE HCL 1 MG/ML IJ SOLN
INTRAMUSCULAR | Status: DC | PRN
  Administered 2014-12-10: 1 mL via OPHTHALMIC

## 2014-12-10 MED ORDER — EPINEPHRINE HCL 1 MG/ML IJ SOLN
INTRAMUSCULAR | Status: AC
  Filled 2014-12-10: qty 1

## 2014-12-10 MED ORDER — TETRACAINE HCL 0.5 % OP SOLN
OPHTHALMIC | Status: AC
  Administered 2014-12-10: 1 [drp] via OPHTHALMIC
  Filled 2014-12-10: qty 2

## 2014-12-10 MED ORDER — CEFUROXIME OPHTHALMIC INJECTION 1 MG/0.1 ML
INJECTION | OPHTHALMIC | Status: DC | PRN
  Administered 2014-12-10: 0.1 mL via INTRACAMERAL

## 2014-12-10 SURGICAL SUPPLY — 22 items
CANNULA ANT/CHMB 27GA (MISCELLANEOUS) ×2 IMPLANT
CUP MEDICINE 2OZ PLAST GRAD ST (MISCELLANEOUS) ×2 IMPLANT
GLOVE BIO SURGEON STRL SZ8 (GLOVE) ×2 IMPLANT
GLOVE BIOGEL M 6.5 STRL (GLOVE) ×2 IMPLANT
GLOVE SURG LX 8.0 MICRO (GLOVE) ×1
GLOVE SURG LX STRL 8.0 MICRO (GLOVE) ×1 IMPLANT
GOWN STRL REUS W/ TWL LRG LVL3 (GOWN DISPOSABLE) ×2 IMPLANT
GOWN STRL REUS W/TWL LRG LVL3 (GOWN DISPOSABLE) ×2
LENS IOL TECNIS 20.0 (Intraocular Lens) ×2 IMPLANT
LENS IOL TECNIS MONO 1P 20.0 (Intraocular Lens) ×1 IMPLANT
PACK CATARACT (MISCELLANEOUS) ×2 IMPLANT
PACK CATARACT BRASINGTON LX (MISCELLANEOUS) ×2 IMPLANT
PACK EYE AFTER SURG (MISCELLANEOUS) ×2 IMPLANT
SOL BSS BAG (MISCELLANEOUS) ×2
SOL PREP PVP 2OZ (MISCELLANEOUS) ×2
SOLUTION BSS BAG (MISCELLANEOUS) ×1 IMPLANT
SOLUTION PREP PVP 2OZ (MISCELLANEOUS) ×1 IMPLANT
SYR 3ML LL SCALE MARK (SYRINGE) ×2 IMPLANT
SYR 5ML LL (SYRINGE) ×2 IMPLANT
SYR TB 1ML 27GX1/2 LL (SYRINGE) ×2 IMPLANT
WATER STERILE IRR 1000ML POUR (IV SOLUTION) ×2 IMPLANT
WIPE NON LINTING 3.25X3.25 (MISCELLANEOUS) ×2 IMPLANT

## 2014-12-10 NOTE — H&P (Signed)
  All labs reviewed. Abnormal studies sent to patients PCP when indicated.  Previous H&P reviewed, patient examined, there are NO CHANGES.  George Glenn LOUIS8/23/20167:14 AM

## 2014-12-10 NOTE — Discharge Instructions (Signed)

## 2014-12-10 NOTE — Addendum Note (Signed)
Addendum  created 12/10/14 0805 by Nelda Marseille, CRNA   Modules edited: Anesthesia Medication Administration

## 2014-12-10 NOTE — Op Note (Signed)
PREOPERATIVE DIAGNOSIS:  Nuclear sclerotic cataract of the left eye.   POSTOPERATIVE DIAGNOSIS:  cataract   OPERATIVE PROCEDURE:  Procedure(s): CATARACT EXTRACTION PHACO AND INTRAOCULAR LENS PLACEMENT (IOC)   SURGEON:  Birder Robson, MD.   ANESTHESIA:   Anesthesiologist: Alvin Critchley, MD CRNA: Nelda Marseille, CRNA  1.      Managed anesthesia care. 2.      Topical tetracaine drops followed by 2% Xylocaine jelly applied in the preoperative holding area.       3.  0.2 ml of epi-Shugarcaine was  placed in the anterior chamber following the paracentesis.    COMPLICATIONS:  None.   TECHNIQUE:   Stop and chop   DESCRIPTION OF PROCEDURE:  The patient was examined and consented in the preoperative holding area where the aforementioned topical anesthesia was applied to the left eye and then brought back to the Operating Room where the left eye was prepped and draped in the usual sterile ophthalmic fashion and a lid speculum was placed. A paracentesis was created with the side port blade and the anterior chamber was filled with viscoelastic. A near clear corneal incision was performed with the steel keratome. A continuous curvilinear capsulorrhexis was performed with a cystotome followed by the capsulorrhexis forceps. Hydrodissection and hydrodelineation were carried out with BSS on a blunt cannula. The lens was removed in a stop and chop  technique and the remaining cortical material was removed with the irrigation-aspiration handpiece. The capsular bag was inflated with viscoelastic and the Technis ZCB00 lens was placed in the capsular bag without complication. The remaining viscoelastic was removed from the eye with the irrigation-aspiration handpiece. The wounds were hydrated. The anterior chamber was flushed with Miostat and the eye was inflated to physiologic pressure. 0.1 mL of cefuroxime concentration 10 mg/mL was placed in the anterior chamber. The wounds were found to be water tight. The eye was  dressed with Vigamox. The patient was given protective glasses to wear throughout the day and a shield with which to sleep tonight. The patient was also given drops with which to begin a drop regimen today and will follow-up with me in one day.  Implant Name Type Inv. Item Serial No. Manufacturer Lot No. LRB No. Used  LENS IMPL INTRAOC ZCB00 20.0 - [id number] Intraocular Lens LENS IMPL INTRAOC ZCB00 20.0 [id number]   Left 1   Procedure(s) with comments: CATARACT EXTRACTION PHACO AND INTRAOCULAR LENS PLACEMENT (IOC) (Left) - US:00:46.1 AP:20.0 CDE:9.22 LOT PACK #[id number]  Electronically signed: Jeree Delcid LOUIS 12/10/2014 7:52 AM

## 2014-12-10 NOTE — Anesthesia Procedure Notes (Signed)
Procedure Name: MAC Date/Time: 12/10/2014 7:42 AM Performed by: Nelda Marseille Pre-anesthesia Checklist: Patient identified, Emergency Drugs available, Suction available, Patient being monitored and Timeout performed Oxygen Delivery Method: Nasal cannula

## 2014-12-10 NOTE — Transfer of Care (Signed)
Immediate Anesthesia Transfer of Care Note  Patient: George Glenn  Procedure(s) Performed: Procedure(s) with comments: CATARACT EXTRACTION PHACO AND INTRAOCULAR LENS PLACEMENT (IOC) (Left) - US:00:46.1 AP:20.0 CDE:9.22 LOT PACK #[id number]  Patient Location: PACU  Anesthesia Type:MAC  Level of Consciousness: awake, alert  and oriented  Airway & Oxygen Therapy: Patient Spontanous Breathing  Post-op Assessment: Report given to RN and Post -op Vital signs reviewed and stable  Post vital signs: Reviewed and stable  Last Vitals:  Filed Vitals:   12/10/14 0611  BP: 137/76  Pulse: 74  Temp: 36.7 C  Resp: 16    Complications: No apparent anesthesia complications

## 2014-12-10 NOTE — Anesthesia Postprocedure Evaluation (Signed)
  Anesthesia Post-op Note  Patient: George Glenn  Procedure(s) Performed: Procedure(s) with comments: CATARACT EXTRACTION PHACO AND INTRAOCULAR LENS PLACEMENT (IOC) (Left) - US:00:46.1 AP:20.0 CDE:9.22 LOT PACK #[id number]  Anesthesia type:MAC  Patient location: PACU  Post pain: Pain level controlled  Post assessment: Post-op Vital signs reviewed, Patient's Cardiovascular Status Stable, Respiratory Function Stable, Patent Airway and No signs of Nausea or vomiting  Post vital signs: Reviewed and stable  Last Vitals:  Filed Vitals:   12/10/14 0756  BP: 125/81  Pulse: 68  Temp: 36.6 C  Resp: 14    Level of consciousness: awake, alert  and patient cooperative  Complications: No apparent anesthesia complications

## 2014-12-10 NOTE — Anesthesia Preprocedure Evaluation (Addendum)
Anesthesia Evaluation  Patient identified by MRN, date of birth, ID band Patient awake    Reviewed: Allergy & Precautions, H&P , NPO status , Patient's Chart, lab work & pertinent test results, reviewed documented beta blocker date and time   Airway Mallampati: II  TM Distance: >3 FB Neck ROM: full    Dental no notable dental hx. (+) Teeth Intact   Pulmonary neg pulmonary ROS,  breath sounds clear to auscultation  Pulmonary exam normal       Cardiovascular Exercise Tolerance: Good hypertension, negative cardio ROS Normal cardiovascular examRhythm:regular Rate:Normal     Neuro/Psych PSYCHIATRIC DISORDERS Schizophrenia Brain dysfunction negative neurological ROS  negative psych ROS   GI/Hepatic negative GI ROS, Neg liver ROS, GERD-  ,  Endo/Other  negative endocrine ROSdiabetes, Well Controlled, Type 2  Renal/GU Renal disease     Musculoskeletal  (+) Arthritis -, Osteoarthritis,    Abdominal Normal abdominal exam  (+)   Peds  Hematology negative hematology ROS (+) anemia ,   Anesthesia Other Findings   Reproductive/Obstetrics negative OB ROS                            Anesthesia Physical Anesthesia Plan  ASA: III  Anesthesia Plan: MAC   Post-op Pain Management:    Induction: Intravenous  Airway Management Planned: Nasal Cannula  Additional Equipment:   Intra-op Plan:   Post-operative Plan:   Informed Consent: I have reviewed the patients History and Physical, chart, labs and discussed the procedure including the risks, benefits and alternatives for the proposed anesthesia with the patient or authorized representative who has indicated his/her understanding and acceptance.   Dental advisory given  Plan Discussed with: CRNA and Surgeon  Anesthesia Plan Comments:         Anesthesia Quick Evaluation

## 2014-12-25 ENCOUNTER — Ambulatory Visit (INDEPENDENT_AMBULATORY_CARE_PROVIDER_SITE_OTHER): Payer: Medicare Other | Admitting: Unknown Physician Specialty

## 2014-12-25 ENCOUNTER — Encounter: Payer: Self-pay | Admitting: Unknown Physician Specialty

## 2014-12-25 VITALS — BP 109/75 | HR 67 | Temp 97.9°F | Ht 70.5 in | Wt 191.2 lb

## 2014-12-25 DIAGNOSIS — G8929 Other chronic pain: Secondary | ICD-10-CM | POA: Insufficient documentation

## 2014-12-25 DIAGNOSIS — R413 Other amnesia: Secondary | ICD-10-CM

## 2014-12-25 DIAGNOSIS — R42 Dizziness and giddiness: Secondary | ICD-10-CM

## 2014-12-25 DIAGNOSIS — G44229 Chronic tension-type headache, not intractable: Secondary | ICD-10-CM

## 2014-12-25 DIAGNOSIS — R519 Headache, unspecified: Secondary | ICD-10-CM | POA: Insufficient documentation

## 2014-12-25 DIAGNOSIS — R51 Headache: Secondary | ICD-10-CM

## 2014-12-25 NOTE — Progress Notes (Signed)
-------------------------------------+--------------------+  BP 109/75 mmHg  Pulse 67  Temp(Src) 97.9 F (36.6 C)  Ht 5' 10.5" (1.791 m)  Wt 191 lb 3.2 oz (86.728 kg)  BMI 27.04 kg/m2  SpO2 94%   Subjective:    Patient ID: George Glenn, male    DOB: June 21, 1960, 54 y.o.   MRN: [id number]  HPI: George Glenn is a 54 y.o. male  Chief Complaint  Patient presents with  . Dizziness    pt's sister states that the patient complains of everything spinning and states patient told sisters husband that he has rinigng in his ears  . Headache    pt's sister states that the patient complains of headaches right across his forehead   Pt is here for above complaints.  This has been going on  for months and getting worse.  He has [facility] neurology who is evaluating memory problems.  He was prescribed Aricept which was never taken and had cataract surgery around that time.  Sister is overwhelmed and tearful.  Both these problems were noted by neurology and those charts were reviewed.  Pt himself is non-verbal and difficult to assess.    Relevant past medical, surgical, family and social history reviewed and updated as indicated. Interim medical history since our last visit reviewed. Allergies and medications reviewed and updated.  Review of Systems  Unable to perform ROS All other systems reviewed and are negative.   Per HPI unless specifically indicated above     Objective:    BP 109/75 mmHg  Pulse 67  Temp(Src) 97.9 F (36.6 C)  Ht 5' 10.5" (1.791 m)  Wt 191 lb 3.2 oz (86.728 kg)  BMI 27.04 kg/m2  SpO2 94%  Wt Readings from Last 3 Encounters:  12/25/14 191 lb 3.2 oz (86.728 kg)  12/10/14 192 lb (87.091 kg)  11/12/14 192 lb (87.091 kg)    Physical Exam  Constitutional: He appears well-developed and well-nourished. No distress.  HENT:  Head: Normocephalic and atraumatic.  Right Ear: Tympanic membrane and ear canal normal.  Left Ear: Tympanic membrane and ear canal normal.   Eyes: Conjunctivae and lids are normal. Right eye exhibits no discharge. Left eye exhibits no discharge. No scleral icterus.  Cardiovascular: Normal rate, regular rhythm and normal heart sounds.   Pulmonary/Chest: Effort normal and breath sounds normal. No respiratory distress.  Abdominal: Normal appearance and bowel sounds are normal. He exhibits no distension. There is no splenomegaly or hepatomegaly. There is no tenderness.  Musculoskeletal: Normal range of motion.  Neurological:  Unable to assess  Skin: Skin is intact. No rash noted. He is not diaphoretic. No pallor.  Psychiatric: He is withdrawn. Cognition and memory are impaired. He is noncommunicative.  Vitals reviewed.   Results for orders placed or performed during the hospital encounter of 12/10/14  Glucose, capillary  Result Value Ref Range   Glucose-Capillary 103 (H) 65 - 99 mg/dL      Assessment & Plan:   Problem List Items Addressed This Visit      Unprioritized   Memory loss    Followed by neurology      Vertigo - Primary    Difficult to assess and this is not new or acute.  This seems to be evaluated by neurology.  This is complicated by profound memory loss and difficult to express symptoms.  Encouraged to take Aricept.  Appointment is pending in October.  Will continue assessment with neurology to determine central vs. peripheral source.        Chronic  headaches    Chronic problem and possibly related to vision and tension headache         Counseling with sister about long-term placement or care in the home.  Patient needs a full time caregiver and unable to perform ADLs  Follow up plan: Return if symptoms worsen or fail to improve, for Will need private consult with sister.  Marland Kitchen

## 2014-12-25 NOTE — Assessment & Plan Note (Signed)
Chronic problem and possibly related to vision and tension headache

## 2014-12-25 NOTE — Assessment & Plan Note (Signed)
Followed by neurology.   

## 2014-12-25 NOTE — Assessment & Plan Note (Addendum)
Difficult to assess and this is not new or acute.  This seems to be evaluated by neurology.  This is complicated by profound memory loss and difficult to express symptoms.  Encouraged to take Aricept.  Appointment is pending in October.  Will continue assessment with neurology to determine central vs. peripheral source.

## 2014-12-26 ENCOUNTER — Telehealth: Payer: Self-pay | Admitting: Unknown Physician Specialty

## 2014-12-26 DIAGNOSIS — R55 Syncope and collapse: Secondary | ICD-10-CM

## 2014-12-26 NOTE — Telephone Encounter (Signed)
Pt's sister/caregiver called and after talking with the family they would like to proceed with a referral to cardiology.  They feel quite strongly that this has to happen.  Please call sister with any questions.

## 2014-12-26 NOTE — Telephone Encounter (Signed)
Routing to provider  

## 2014-12-27 NOTE — Telephone Encounter (Signed)
OK. Will order

## 2014-12-30 ENCOUNTER — Telehealth: Payer: Self-pay

## 2014-12-30 ENCOUNTER — Telehealth: Payer: Self-pay | Admitting: Unknown Physician Specialty

## 2014-12-30 NOTE — Telephone Encounter (Signed)
Pt's sister called to inquire about the status of pt's referral to Cardiology. Please call her asap. Sister stated pt had been sitting with his feet up but when she looked at his feet they had started to turn blue. Sister also stated she has noticed his lips turning blue previously. Pt's sister also stated there is a history of heart disease in the family. Thanks.

## 2014-12-30 NOTE — Telephone Encounter (Signed)
This is the pt I was telling you about. His sister is his POA. Pt's sister stated Malachy Mood said they should allow nature to take its course. Sister stated this was unacceptable and she was not going to watch her brother die and she can not afford to place him in an assisted living facility. Sister stated her daughter is a Marine scientist and that the daughter instructed her to call and file a compliant against Cheryl's license. Sister also stated if her brother dies she will file a lawsuit. Sister also stated there is an extensive family history of heart disease, she herself has congestive heart failure. She stated she has witnessed pt's lips and feet starting to turn blue. I informed her that if she notices this happening again she should get the pt to the ER ASAP or contact the paramedics for transport. Just wanted to let give you a heads up. Thanks.

## 2014-12-30 NOTE — Telephone Encounter (Signed)
Patient's sister returned call and I informed her that the referral had been put in and they should be calling them to schedule the appointment. I also asked her about the blue lips and feet and she stated that it was intermittent and I told her that if they keep turning blue or stay blue, then they should go to the ER.

## 2014-12-30 NOTE — Telephone Encounter (Signed)
Called and left patient's sister a voicemail asking for her to return my call.

## 2015-01-02 DIAGNOSIS — R51 Headache: Secondary | ICD-10-CM | POA: Diagnosis not present

## 2015-01-02 DIAGNOSIS — R296 Repeated falls: Secondary | ICD-10-CM | POA: Diagnosis not present

## 2015-01-02 DIAGNOSIS — R42 Dizziness and giddiness: Secondary | ICD-10-CM | POA: Diagnosis not present

## 2015-01-02 DIAGNOSIS — R413 Other amnesia: Secondary | ICD-10-CM | POA: Diagnosis not present

## 2015-01-10 DIAGNOSIS — R42 Dizziness and giddiness: Secondary | ICD-10-CM | POA: Diagnosis not present

## 2015-01-16 ENCOUNTER — Ambulatory Visit: Payer: Medicare Other | Admitting: Physical Therapy

## 2015-01-16 DIAGNOSIS — R42 Dizziness and giddiness: Secondary | ICD-10-CM | POA: Diagnosis not present

## 2015-01-16 DIAGNOSIS — R0602 Shortness of breath: Secondary | ICD-10-CM | POA: Insufficient documentation

## 2015-01-20 DIAGNOSIS — R279 Unspecified lack of coordination: Secondary | ICD-10-CM | POA: Diagnosis not present

## 2015-01-20 DIAGNOSIS — I872 Venous insufficiency (chronic) (peripheral): Secondary | ICD-10-CM | POA: Diagnosis not present

## 2015-01-20 DIAGNOSIS — E785 Hyperlipidemia, unspecified: Secondary | ICD-10-CM | POA: Diagnosis not present

## 2015-01-24 ENCOUNTER — Other Ambulatory Visit: Payer: Self-pay

## 2015-01-24 MED ORDER — SIMVASTATIN 10 MG PO TABS: 10.0000 mg | ORAL_TABLET | Freq: Every day | ORAL | Status: DC

## 2015-01-24 NOTE — Telephone Encounter (Signed)
PATIENT: George Glenn DOB: 01/14/1961 PHARMACY: [id number] [place]   PRACTICE PARTNER: [id number] LAST VISIT: 08/21/2014  Patient requests Simvastatin 10 mg tab.

## 2015-01-29 DIAGNOSIS — R0602 Shortness of breath: Secondary | ICD-10-CM | POA: Diagnosis not present

## 2015-01-29 DIAGNOSIS — R413 Other amnesia: Secondary | ICD-10-CM | POA: Diagnosis not present

## 2015-01-29 DIAGNOSIS — R296 Repeated falls: Secondary | ICD-10-CM | POA: Diagnosis not present

## 2015-01-29 DIAGNOSIS — R42 Dizziness and giddiness: Secondary | ICD-10-CM | POA: Diagnosis not present

## 2015-02-03 DIAGNOSIS — R0602 Shortness of breath: Secondary | ICD-10-CM | POA: Diagnosis not present

## 2015-02-03 DIAGNOSIS — R42 Dizziness and giddiness: Secondary | ICD-10-CM | POA: Diagnosis not present

## 2015-02-05 ENCOUNTER — Telehealth: Payer: Self-pay | Admitting: *Deleted

## 2015-02-05 NOTE — Telephone Encounter (Signed)
lmov to schedule referral from PCP

## 2015-02-10 ENCOUNTER — Ambulatory Visit: Payer: Medicare Other | Admitting: Cardiovascular Disease

## 2015-02-24 DIAGNOSIS — E785 Hyperlipidemia, unspecified: Secondary | ICD-10-CM | POA: Diagnosis not present

## 2015-02-24 DIAGNOSIS — I69998 Other sequelae following unspecified cerebrovascular disease: Secondary | ICD-10-CM | POA: Diagnosis not present

## 2015-02-24 DIAGNOSIS — I6529 Occlusion and stenosis of unspecified carotid artery: Secondary | ICD-10-CM | POA: Diagnosis not present

## 2015-02-24 DIAGNOSIS — M7989 Other specified soft tissue disorders: Secondary | ICD-10-CM | POA: Diagnosis not present

## 2015-02-24 DIAGNOSIS — I872 Venous insufficiency (chronic) (peripheral): Secondary | ICD-10-CM | POA: Diagnosis not present

## 2015-02-24 DIAGNOSIS — I10 Essential (primary) hypertension: Secondary | ICD-10-CM | POA: Diagnosis not present

## 2015-02-24 DIAGNOSIS — R279 Unspecified lack of coordination: Secondary | ICD-10-CM | POA: Diagnosis not present

## 2015-02-24 DIAGNOSIS — I8310 Varicose veins of unspecified lower extremity with inflammation: Secondary | ICD-10-CM | POA: Diagnosis not present

## 2015-03-11 ENCOUNTER — Other Ambulatory Visit: Payer: Self-pay | Admitting: Unknown Physician Specialty

## 2015-03-12 ENCOUNTER — Telehealth: Payer: Self-pay | Admitting: Unknown Physician Specialty

## 2015-03-12 NOTE — Telephone Encounter (Signed)
Please call it in 

## 2015-03-12 NOTE — Telephone Encounter (Signed)
[facility] [place] [facility], [facility] needs [facility]ill on Nexium. [facility] [place]. Thanks.

## 2015-03-12 NOTE — Telephone Encounter (Signed)
Medication was refilled yesterday but did not get to pharmacy. Do you want to resend or call in?

## 2015-03-12 NOTE — Telephone Encounter (Signed)
Called in prescription to pharmacy.

## 2015-06-03 DIAGNOSIS — R0602 Shortness of breath: Secondary | ICD-10-CM | POA: Diagnosis not present

## 2015-06-03 DIAGNOSIS — R42 Dizziness and giddiness: Secondary | ICD-10-CM | POA: Diagnosis not present

## 2015-07-30 ENCOUNTER — Encounter: Payer: Self-pay | Admitting: Unknown Physician Specialty

## 2015-07-30 ENCOUNTER — Ambulatory Visit (INDEPENDENT_AMBULATORY_CARE_PROVIDER_SITE_OTHER): Payer: Medicare Other | Admitting: Unknown Physician Specialty

## 2015-07-30 VITALS — BP 123/84 | HR 103 | Temp 98.0°F | Ht 69.6 in | Wt 184.0 lb

## 2015-07-30 DIAGNOSIS — R451 Restlessness and agitation: Secondary | ICD-10-CM | POA: Diagnosis not present

## 2015-07-30 DIAGNOSIS — Z0000 Encounter for general adult medical examination without abnormal findings: Secondary | ICD-10-CM

## 2015-07-30 DIAGNOSIS — I10 Essential (primary) hypertension: Secondary | ICD-10-CM

## 2015-07-30 DIAGNOSIS — R413 Other amnesia: Secondary | ICD-10-CM

## 2015-07-30 DIAGNOSIS — R7301 Impaired fasting glucose: Secondary | ICD-10-CM

## 2015-07-30 DIAGNOSIS — Z789 Other specified health status: Secondary | ICD-10-CM

## 2015-07-30 DIAGNOSIS — E538 Deficiency of other specified B group vitamins: Secondary | ICD-10-CM

## 2015-07-30 DIAGNOSIS — E785 Hyperlipidemia, unspecified: Secondary | ICD-10-CM

## 2015-07-30 LAB — BAYER DCA HB A1C WAIVED: HB A1C (BAYER DCA - WAIVED): 6.4 % (ref <7.0)

## 2015-07-30 MED ORDER — LORAZEPAM 0.5 MG PO TABS: 0.5000 mg | ORAL_TABLET | Freq: Two times a day (BID) | ORAL | Status: DC | PRN

## 2015-07-30 NOTE — Assessment & Plan Note (Signed)
Unable to perform ADLs.  Will see about getting personal care aid.

## 2015-07-30 NOTE — Assessment & Plan Note (Signed)
Check Lipid panel 

## 2015-07-30 NOTE — Assessment & Plan Note (Addendum)
Unable to assess for depression due to poor verbal skills.  Will DC Pamelor for no effect.  Start Lorazepam .5 mg BID prn

## 2015-07-30 NOTE — Assessment & Plan Note (Signed)
No change.  DC Donepezil 5 mg. For no effect

## 2015-07-30 NOTE — Patient Instructions (Signed)
DC Donepezil 5 mg DC Pamelor/Nortriptyline Start Lorazepam .5 mg twice a day prn

## 2015-07-30 NOTE — Assessment & Plan Note (Signed)
Stable, continue present medications.   

## 2015-07-30 NOTE — Progress Notes (Addendum)
BP 123/84 mmHg  Pulse 103  Temp(Src) 98 F (36.7 C)  Ht 5' 9.6" (1.768 m)  Wt 184 lb (83.462 kg)  BMI 26.70 kg/m2  SpO2 94%   Subjective:    Patient ID: George Glenn, male    DOB: December 09, 1960, 55 y.o.   MRN: [id number]  HPI: George Glenn is a 55 y.o. male  Chief Complaint  Patient presents with  . Hyperlipidemia  . Hypertension   Hypertension Using medications without difficulty Average home BPs   No problems or lightheadedness No chest pain with exertion or shortness of breath No Edema   Hyperlipidemia Using medications without problems: No Muscle aches   History of Diabetes A1C needs to be checked  Memory No worse and no betteras to memory but sister on Donepezil.  No f/u on that dose with neurology but sister feels there is a difference for the worse as to mood.  He is more irritable, staying in room and coming out.  He was taken off the Risperidol and Lorazepam by me.  His sister feels that Lorazepam would be helpful.  The nortyptyline is "working in reverse."  He lacks desire.  Dizzyness is better and headaches are stable  Weight loss Lack of interest in eating  Relevant past medical, surgical, family and social history reviewed and updated as indicated. Interim medical history since our last visit reviewed. Allergies and medications reviewed and updated.  Review of Systems  Per HPI unless specifically indicated above     Objective:    BP 123/84 mmHg  Pulse 103  Temp(Src) 98 F (36.7 C)  Ht 5' 9.6" (1.768 m)  Wt 184 lb (83.462 kg)  BMI 26.70 kg/m2  SpO2 94%  Wt Readings from Last 3 Encounters:  07/30/15 184 lb (83.462 kg)  12/25/14 191 lb 3.2 oz (86.728 kg)  12/10/14 192 lb (87.091 kg)    Physical Exam  Constitutional: He is oriented to person, place, and time. He appears well-developed and well-nourished. No distress.  HENT:  Head: Normocephalic and atraumatic.  Eyes: Conjunctivae and lids are normal. Right eye exhibits no discharge.  Left eye exhibits no discharge. No scleral icterus.  Neck: Normal range of motion. Neck supple. No JVD present. Carotid bruit is not present.  Cardiovascular: Normal rate, regular rhythm and normal heart sounds.   Pulmonary/Chest: Effort normal and breath sounds normal. No respiratory distress.  Abdominal: Normal appearance. There is no splenomegaly or hepatomegaly.  Musculoskeletal: Normal range of motion.  Neurological: He is alert and oriented to person, place, and time.  Skin: Skin is warm, dry and intact. No rash noted. No pallor.  Psychiatric: He has a normal mood and affect. His behavior is normal. Judgment and thought content normal.    Results for orders placed or performed during the hospital encounter of 12/10/14  Glucose, capillary  Result Value Ref Range   Glucose-Capillary 103 (H) 65 - 99 mg/dL      Assessment & Plan:   Problem List Items Addressed This Visit      Unprioritized   Agitation    Unable to assess for depression due to poor verbal skills.  Will DC Pamelor for no effect.  Start Lorazepam .5 mg BID prn      Decreased activities of daily living (ADL)    Unable to perform ADLs.  Will see about getting personal care aid.        Hyperlipidemia    Check Lipid panel      Relevant Orders  Lipid Panel w/o Chol/HDL Ratio   Hypertension    Stable, continue present medications.        Relevant Orders   Comprehensive metabolic panel   Memory loss - Primary    No change.  DC Donepezil 5 mg. For no effect      Relevant Orders   CBC with Differential/Platelet    Other Visit Diagnoses    Routine general medical examination at a health care facility        Relevant Orders    Hepatitis C antibody    IFG (impaired fasting glucose)        Relevant Orders    Bayer DCA Hb A1c Waived    B12 deficiency        Relevant Orders    CBC with Differential/Platelet       Unable to independently perform ADLS.  Put in order for an aid to come to the house for  further management.   I  had a conversation with his sister about a message I read on 01/09/15 interpreting something I said as "let nature take it's course" in relation to her brother's health and her threatening to report to the medical board.  Pt states she was very stressed at the time and wants me to continue being his primary care giver.  I have agreed to continue but I let her know that if at any time she doesn't trust my care, she should let me and Dr. Wynetta Emery know and should change providers.    Follow up plan: Return in about 4 weeks (around 08/27/2015).

## 2015-07-31 ENCOUNTER — Other Ambulatory Visit: Payer: Self-pay | Admitting: Unknown Physician Specialty

## 2015-07-31 DIAGNOSIS — Z7409 Other reduced mobility: Secondary | ICD-10-CM

## 2015-07-31 DIAGNOSIS — Z789 Other specified health status: Secondary | ICD-10-CM

## 2015-07-31 LAB — COMPREHENSIVE METABOLIC PANEL
ALK PHOS: 102 IU/L (ref 39–117)
ALT: 34 IU/L (ref 0–44)
AST: 24 IU/L (ref 0–40)
Albumin/Globulin Ratio: 1.6 (ref 1.2–2.2)
Albumin: 4.4 g/dL (ref 3.5–5.5)
BUN/Creatinine Ratio: 16 (ref 9–20)
BUN: 16 mg/dL (ref 6–24)
Bilirubin Total: 0.2 mg/dL (ref 0.0–1.2)
CO2: 23 mmol/L (ref 18–29)
CREATININE: 0.98 mg/dL (ref 0.76–1.27)
Calcium: 9.3 mg/dL (ref 8.7–10.2)
Chloride: 99 mmol/L (ref 96–106)
GFR calc Af Amer: 100 mL/min/{1.73_m2} (ref >59)
GFR calc non Af Amer: 86 mL/min/{1.73_m2} (ref >59)
GLUCOSE: 135 mg/dL — AB (ref 65–99)
Globulin, Total: 2.8 g/dL (ref 1.5–4.5)
Potassium: 4.6 mmol/L (ref 3.5–5.2)
Sodium: 140 mmol/L (ref 134–144)
Total Protein: 7.2 g/dL (ref 6.0–8.5)

## 2015-07-31 LAB — LIPID PANEL W/O CHOL/HDL RATIO
CHOLESTEROL TOTAL: 159 mg/dL (ref 100–199)
HDL: 27 mg/dL — ABNORMAL LOW (ref >39)
LDL CALC: 98 mg/dL (ref 0–99)
TRIGLYCERIDES: 170 mg/dL — AB (ref 0–149)
VLDL CHOLESTEROL CAL: 34 mg/dL (ref 5–40)

## 2015-07-31 LAB — CBC WITH DIFFERENTIAL/PLATELET
BASOS ABS: 0 10*3/uL (ref 0.0–0.2)
Basos: 1 %
EOS (ABSOLUTE): 0.1 10*3/uL (ref 0.0–0.4)
Eos: 1 %
HEMOGLOBIN: 15.5 g/dL (ref 12.6–17.7)
Hematocrit: 47.5 % (ref 37.5–51.0)
Immature Grans (Abs): 0 10*3/uL (ref 0.0–0.1)
Immature Granulocytes: 0 %
LYMPHS ABS: 1.8 10*3/uL (ref 0.7–3.1)
Lymphs: 28 %
MCH: 27.1 pg (ref 26.6–33.0)
MCHC: 32.6 g/dL (ref 31.5–35.7)
MCV: 83 fL (ref 79–97)
MONOS ABS: 0.5 10*3/uL (ref 0.1–0.9)
Monocytes: 8 %
Neutrophils Absolute: 3.8 10*3/uL (ref 1.4–7.0)
Neutrophils: 62 %
PLATELETS: 266 10*3/uL (ref 150–379)
RBC: 5.73 x10E6/uL (ref 4.14–5.80)
RDW: 15.1 % (ref 12.3–15.4)
WBC: 6.2 10*3/uL (ref 3.4–10.8)

## 2015-07-31 LAB — HEPATITIS C ANTIBODY: Hep C Virus Ab: 0.2 s/co ratio (ref 0.0–0.9)

## 2015-08-01 ENCOUNTER — Encounter: Payer: Self-pay | Admitting: Unknown Physician Specialty

## 2015-08-04 ENCOUNTER — Other Ambulatory Visit: Payer: Self-pay | Admitting: Unknown Physician Specialty

## 2015-08-27 ENCOUNTER — Encounter: Payer: Self-pay | Admitting: Unknown Physician Specialty

## 2015-08-27 ENCOUNTER — Ambulatory Visit (INDEPENDENT_AMBULATORY_CARE_PROVIDER_SITE_OTHER): Payer: Medicare Other | Admitting: Unknown Physician Specialty

## 2015-08-27 ENCOUNTER — Telehealth: Payer: Self-pay | Admitting: Unknown Physician Specialty

## 2015-08-27 ENCOUNTER — Ambulatory Visit
Admission: RE | Admit: 2015-08-27 | Discharge: 2015-08-27 | Disposition: A | Discharge status: Home / Self Care | Payer: Medicare Other | Source: Ambulatory Visit | Attending: Unknown Physician Specialty | Admitting: Unknown Physician Specialty

## 2015-08-27 VITALS — BP 127/85 | HR 67 | Temp 98.3°F | Ht 69.0 in | Wt 183.8 lb

## 2015-08-27 DIAGNOSIS — K449 Diaphragmatic hernia without obstruction or gangrene: Secondary | ICD-10-CM | POA: Diagnosis not present

## 2015-08-27 DIAGNOSIS — Z789 Other specified health status: Secondary | ICD-10-CM | POA: Diagnosis not present

## 2015-08-27 DIAGNOSIS — R634 Abnormal weight loss: Secondary | ICD-10-CM | POA: Insufficient documentation

## 2015-08-27 DIAGNOSIS — R451 Restlessness and agitation: Secondary | ICD-10-CM | POA: Diagnosis not present

## 2015-08-27 MED ORDER — SIMVASTATIN 10 MG PO TABS: 10.0000 mg | ORAL_TABLET | Freq: Every day | ORAL | Status: DC

## 2015-08-27 MED ORDER — LORAZEPAM 0.5 MG PO TABS: 0.5000 mg | ORAL_TABLET | Freq: Three times a day (TID) | ORAL | Status: DC | PRN

## 2015-08-27 MED ORDER — ESOMEPRAZOLE MAGNESIUM 40 MG PO CPDR: 40.0000 mg | DELAYED_RELEASE_CAPSULE | Freq: Every day | ORAL | Status: DC

## 2015-08-27 NOTE — Telephone Encounter (Signed)
Discussed x-ray.  Recheck in 4 weeks to evaluated weight and appetite

## 2015-08-27 NOTE — Assessment & Plan Note (Signed)
Waiting for assistance

## 2015-08-27 NOTE — Assessment & Plan Note (Signed)
Order chest x-ray for this plus PO2 of 94% in this non-smoker

## 2015-08-27 NOTE — Progress Notes (Signed)
BP 127/85 mmHg  Pulse 67  Temp(Src) 98.3 F (36.8 C)  Ht 5\' 9"  (1.753 m)  Wt 183 lb 12.8 oz (83.371 kg)  BMI 27.13 kg/m2  SpO2 94%   Subjective:    Patient ID: George Glenn, male    DOB: [birth year], 55 y.o.   MRN: [id number]  HPI: George Glenn is a 55 y.o. male  Chief Complaint  Patient presents with  . Memory Loss    4 week f/u  . Agitation    4 week f/u   Started on Lorazepam .5 mg BID and agitation seems to be "a little" better with his agitation.  At one time he was on 2 mg TID.   He does seem to have lost his appetite which he has slowly come back from.  He lost a pound since last visit.  Memory loss no better and I did sign papers for a home health aid through Medicaid/Medicare.  They have not yet heard back.    Relevant past medical, surgical, family and social history reviewed and updated as indicated. Interim medical history since our last visit reviewed. Allergies and medications reviewed and updated.  Review of Systems  Per HPI unless specifically indicated above     Objective:    BP 127/85 mmHg  Pulse 67  Temp(Src) 98.3 F (36.8 C)  Ht 5\' 9"  (1.753 m)  Wt 183 lb 12.8 oz (83.371 kg)  BMI 27.13 kg/m2  SpO2 94%  Wt Readings from Last 3 Encounters:  08/27/15 183 lb 12.8 oz (83.371 kg)  07/30/15 184 lb (83.462 kg)  12/25/14 191 lb 3.2 oz (86.728 kg)    Physical Exam  Constitutional: He is oriented to person, place, and time. He appears well-developed and well-nourished. No distress.  HENT:  Head: Normocephalic and atraumatic.  Eyes: Conjunctivae and lids are normal. Right eye exhibits no discharge. Left eye exhibits no discharge. No scleral icterus.  Neck: Normal range of motion. Neck supple. No JVD present. Carotid bruit is not present.  Cardiovascular: Normal rate, regular rhythm and normal heart sounds.   Pulmonary/Chest: Effort normal and breath sounds normal. No respiratory distress.  Abdominal: Normal appearance. There is no splenomegaly  or hepatomegaly.  Musculoskeletal: Normal range of motion.  Neurological: He is alert and oriented to person, place, and time.  Skin: Skin is warm, dry and intact. No rash noted. No pallor.  Psychiatric: He has a normal mood and affect. His behavior is normal. Judgment and thought content normal.    Results for orders placed or performed in visit on 07/30/15  CBC with Differential/Platelet  Result Value Ref Range   WBC 6.2 3.4 - 10.8 x10E3/uL   RBC 5.73 4.14 - 5.80 x10E6/uL   Hemoglobin 15.5 12.6 - 17.7 g/dL   Hematocrit 47.5 37.5 - 51.0 %   MCV 83 79 - 97 fL   MCH 27.1 26.6 - 33.0 pg   MCHC 32.6 31.5 - 35.7 g/dL   RDW 15.1 12.3 - 15.4 %   Platelets 266 150 - 379 x10E3/uL   Neutrophils 62 %   Lymphs 28 %   Monocytes 8 %   Eos 1 %   Basos 1 %   Neutrophils Absolute 3.8 1.4 - 7.0 x10E3/uL   Lymphocytes Absolute 1.8 0.7 - 3.1 x10E3/uL   Monocytes Absolute 0.5 0.1 - 0.9 x10E3/uL   EOS (ABSOLUTE) 0.1 0.0 - 0.4 x10E3/uL   Basophils Absolute 0.0 0.0 - 0.2 x10E3/uL   Immature Granulocytes 0 %   Immature  Grans (Abs) 0.0 0.0 - 0.1 x10E3/uL  Comprehensive metabolic panel  Result Value Ref Range   Glucose 135 (H) 65 - 99 mg/dL   BUN 16 6 - 24 mg/dL   Creatinine, Ser 0.98 0.76 - 1.27 mg/dL   GFR calc non Af Amer 86 >59 mL/min/1.73   GFR calc Af Amer 100 >59 mL/min/1.73   BUN/Creatinine Ratio 16 9 - 20   Sodium 140 134 - 144 mmol/L   Potassium 4.6 3.5 - 5.2 mmol/L   Chloride 99 96 - 106 mmol/L   CO2 23 18 - 29 mmol/L   Calcium 9.3 8.7 - 10.2 mg/dL   Total Protein 7.2 6.0 - 8.5 g/dL   Albumin 4.4 3.5 - 5.5 g/dL   Globulin, Total 2.8 1.5 - 4.5 g/dL   Albumin/Globulin Ratio 1.6 1.2 - 2.2   Bilirubin Total 0.2 0.0 - 1.2 mg/dL   Alkaline Phosphatase 102 39 - 117 IU/L   AST 24 0 - 40 IU/L   ALT 34 0 - 44 IU/L  Lipid Panel w/o Chol/HDL Ratio  Result Value Ref Range   Cholesterol, Total 159 100 - 199 mg/dL   Triglycerides 170 (H) 0 - 149 mg/dL   HDL 27 (L) >39 mg/dL   VLDL  Cholesterol Cal 34 5 - 40 mg/dL   LDL Calculated 98 0 - 99 mg/dL  Bayer DCA Hb A1c Waived  Result Value Ref Range   Bayer DCA Hb A1c Waived 6.4 <7.0 %  Hepatitis C antibody  Result Value Ref Range   Hep C Virus Ab 0.2 0.0 - 0.9 s/co ratio      Assessment & Plan:   Problem List Items Addressed This Visit      Unprioritized   Agitation - Primary    Improved slightly.  Gave the option of increasing Lorazepam to TID.  Pt is never alone      Decreased activities of daily living (ADL)    Waiting for assistance      Weight loss    Order chest x-ray for this plus PO2 of 94% in this non-smoker      Relevant Orders   DG Chest 2 View       Follow up plan: Return in about 3 months (around 11/27/2015).

## 2015-08-27 NOTE — Assessment & Plan Note (Signed)
Improved slightly.  Gave the option of increasing Lorazepam to TID.  Pt is never alone

## 2015-08-28 NOTE — Telephone Encounter (Signed)
Pt scheduled for 4 week fu 09/26/15 @ 10:45AM. Thanks.

## 2015-09-26 ENCOUNTER — Ambulatory Visit: Payer: Medicare Other | Admitting: Unknown Physician Specialty

## 2015-12-03 ENCOUNTER — Encounter: Payer: Self-pay | Admitting: Unknown Physician Specialty

## 2015-12-03 ENCOUNTER — Ambulatory Visit (INDEPENDENT_AMBULATORY_CARE_PROVIDER_SITE_OTHER): Payer: Medicare Other | Admitting: Unknown Physician Specialty

## 2015-12-03 VITALS — BP 116/75 | HR 51 | Temp 97.8°F | Ht 70.2 in | Wt 184.4 lb

## 2015-12-03 DIAGNOSIS — R413 Other amnesia: Secondary | ICD-10-CM | POA: Diagnosis not present

## 2015-12-03 DIAGNOSIS — Z789 Other specified health status: Secondary | ICD-10-CM

## 2015-12-03 DIAGNOSIS — G939 Disorder of brain, unspecified: Secondary | ICD-10-CM

## 2015-12-03 DIAGNOSIS — I10 Essential (primary) hypertension: Secondary | ICD-10-CM

## 2015-12-03 MED ORDER — ESOMEPRAZOLE MAGNESIUM 40 MG PO CPDR: 40.0000 mg | DELAYED_RELEASE_CAPSULE | Freq: Every day | ORAL | 3 refills | Status: DC

## 2015-12-03 NOTE — Progress Notes (Signed)
BP 116/75 (BP Location: Left Arm, Patient Position: Sitting, Cuff Size: Large)   Pulse (!) 51   Temp 97.8 F (36.6 C)   Ht 5' 10.2" (1.783 m)   Wt 184 lb 6.4 oz (83.6 kg)   SpO2 97%   BMI 26.31 kg/m    Subjective:    Patient ID: George Glenn, male    DOB: [birth year], 55 y.o.   MRN: [id number]  HPI: George Glenn is a 55 y.o. male  Chief Complaint  Patient presents with  . Hyperlipidemia  . Hypertension  . Anxiety  . Labs Only    pt's sister asked about having labs done to check patient's prostate   Here for a f/u.    Hypertension Using medications without difficulty Average home BPs  No problems or lightheadedness No chest pain with exertion or shortness of breath No Edema   Hyperlipidemia Using medications without problems: No Muscle aches  Diet compliance: Exercise:  Weight loss Stabilized   Relevant past medical, surgical, family and social history reviewed and updated as indicated. Interim medical history since our last visit reviewed. Allergies and medications reviewed and updated.  Review of Systems  Per HPI unless specifically indicated above     Objective:    BP 116/75 (BP Location: Left Arm, Patient Position: Sitting, Cuff Size: Large)   Pulse (!) 51   Temp 97.8 F (36.6 C)   Ht 5' 10.2" (1.783 m)   Wt 184 lb 6.4 oz (83.6 kg)   SpO2 97%   BMI 26.31 kg/m   Wt Readings from Last 3 Encounters:  12/03/15 184 lb 6.4 oz (83.6 kg)  08/27/15 183 lb 12.8 oz (83.4 kg)  07/30/15 184 lb (83.5 kg)    Physical Exam  Constitutional: He appears well-developed and well-nourished. He is cooperative. No distress.  HENT:  Head: Normocephalic and atraumatic.  Eyes: Conjunctivae and lids are normal. Right eye exhibits no discharge. Left eye exhibits no discharge. No scleral icterus.  Neck: Normal range of motion. Neck supple. No JVD present. Carotid bruit is not present.  Cardiovascular: Normal rate, regular rhythm and normal heart sounds.     Pulmonary/Chest: Effort normal and breath sounds normal. No respiratory distress.  Abdominal: Normal appearance. There is no splenomegaly or hepatomegaly.  Musculoskeletal: Normal range of motion.  Neurological: He is alert.  Skin: Skin is warm, dry and intact. No rash noted. No pallor.    Results for orders placed or performed in visit on 07/30/15  CBC with Differential/Platelet  Result Value Ref Range   WBC 6.2 3.4 - 10.8 x10E3/uL   RBC 5.73 4.14 - 5.80 x10E6/uL   Hemoglobin 15.5 12.6 - 17.7 g/dL   Hematocrit 47.5 37.5 - 51.0 %   MCV 83 79 - 97 fL   MCH 27.1 26.6 - 33.0 pg   MCHC 32.6 31.5 - 35.7 g/dL   RDW 15.1 12.3 - 15.4 %   Platelets 266 150 - 379 x10E3/uL   Neutrophils 62 %   Lymphs 28 %   Monocytes 8 %   Eos 1 %   Basos 1 %   Neutrophils Absolute 3.8 1.4 - 7.0 x10E3/uL   Lymphocytes Absolute 1.8 0.7 - 3.1 x10E3/uL   Monocytes Absolute 0.5 0.1 - 0.9 x10E3/uL   EOS (ABSOLUTE) 0.1 0.0 - 0.4 x10E3/uL   Basophils Absolute 0.0 0.0 - 0.2 x10E3/uL   Immature Granulocytes 0 %   Immature Grans (Abs) 0.0 0.0 - 0.1 x10E3/uL  Comprehensive metabolic panel  Result Value Ref  Range   Glucose 135 (H) 65 - 99 mg/dL   BUN 16 6 - 24 mg/dL   Creatinine, Ser 0.98 0.76 - 1.27 mg/dL   GFR calc non Af Amer 86 >59 mL/min/1.73   GFR calc Af Amer 100 >59 mL/min/1.73   BUN/Creatinine Ratio 16 9 - 20   Sodium 140 134 - 144 mmol/L   Potassium 4.6 3.5 - 5.2 mmol/L   Chloride 99 96 - 106 mmol/L   CO2 23 18 - 29 mmol/L   Calcium 9.3 8.7 - 10.2 mg/dL   Total Protein 7.2 6.0 - 8.5 g/dL   Albumin 4.4 3.5 - 5.5 g/dL   Globulin, Total 2.8 1.5 - 4.5 g/dL   Albumin/Globulin Ratio 1.6 1.2 - 2.2   Bilirubin Total 0.2 0.0 - 1.2 mg/dL   Alkaline Phosphatase 102 39 - 117 IU/L   AST 24 0 - 40 IU/L   ALT 34 0 - 44 IU/L  Lipid Panel w/o Chol/HDL Ratio  Result Value Ref Range   Cholesterol, Total 159 100 - 199 mg/dL   Triglycerides 170 (H) 0 - 149 mg/dL   HDL 27 (L) >39 mg/dL   VLDL Cholesterol Cal  34 5 - 40 mg/dL   LDL Calculated 98 0 - 99 mg/dL  Bayer DCA Hb A1c Waived  Result Value Ref Range   Bayer DCA Hb A1c Waived 6.4 <7.0 %  Hepatitis C antibody  Result Value Ref Range   Hep C Virus Ab 0.2 0.0 - 0.9 s/co ratio      Assessment & Plan:   Problem List Items Addressed This Visit      Unprioritized   Brain damage   Decreased activities of daily living (ADL) - Primary   Hypertension   Memory loss    Other Visit Diagnoses   None.     Labs stable from previous.  Still needs help with ADLs.  Never has been contacted from my previous orders.  Refer [facility]  Follow up plan: No Follow-up on file.

## 2016-01-24 ENCOUNTER — Other Ambulatory Visit: Payer: Self-pay | Admitting: Unknown Physician Specialty

## 2016-01-29 NOTE — Telephone Encounter (Signed)
Can we please check with the pharmacy. PMP says that he should have a refill on 60 pills that was filled 12/25/15- if he doesn't I'll be happy to refill 60 pills 1 tab BID PRN with no refills until Malachy Mood gets back and you can call this in.

## 2016-01-29 NOTE — Telephone Encounter (Signed)
Pt is completely out of this medication please send ASAP. Thanks.

## 2016-01-29 NOTE — Telephone Encounter (Signed)
Last refill was in August

## 2016-01-29 NOTE — Telephone Encounter (Signed)
Pt's guardian called stated pt needs a refill on Lorazepam. [facility] [place]. Thanks.

## 2016-03-04 ENCOUNTER — Other Ambulatory Visit: Payer: Self-pay | Admitting: Family Medicine

## 2016-03-04 NOTE — Telephone Encounter (Signed)
Your patient 

## 2016-03-08 ENCOUNTER — Encounter: Payer: Self-pay | Admitting: Unknown Physician Specialty

## 2016-03-08 ENCOUNTER — Ambulatory Visit (INDEPENDENT_AMBULATORY_CARE_PROVIDER_SITE_OTHER): Payer: Medicare Other | Admitting: Unknown Physician Specialty

## 2016-03-08 VITALS — BP 123/78 | HR 98 | Temp 98.2°F | Ht 70.0 in | Wt 184.8 lb

## 2016-03-08 DIAGNOSIS — K2270 Barrett's esophagus without dysplasia: Secondary | ICD-10-CM | POA: Diagnosis not present

## 2016-03-08 DIAGNOSIS — R198 Other specified symptoms and signs involving the digestive system and abdomen: Secondary | ICD-10-CM | POA: Diagnosis not present

## 2016-03-08 DIAGNOSIS — J3089 Other allergic rhinitis: Secondary | ICD-10-CM | POA: Diagnosis not present

## 2016-03-08 DIAGNOSIS — Z23 Encounter for immunization: Secondary | ICD-10-CM | POA: Diagnosis not present

## 2016-03-08 MED ORDER — IPRATROPIUM BROMIDE 0.06 % NA SOLN: 2.0000 | Freq: Four times a day (QID) | NASAL | 12 refills | Status: DC

## 2016-03-08 NOTE — Patient Instructions (Signed)

## 2016-03-08 NOTE — Progress Notes (Signed)
BP 123/78 (BP Location: Left Arm, Patient Position: Sitting, Cuff Size: Normal)   Pulse 98   Temp 98.2 F (36.8 C)   Ht 5\' 10"  (1.778 m)   Wt 184 lb 12.8 oz (83.8 kg)   SpO2 94%   BMI 26.52 kg/m    Subjective:    Patient ID: George Glenn, male    DOB: 06/28/1960, 55 y.o.   MRN: [id number]  HPI: George Glenn is a 55 y.o. male  Chief Complaint  Patient presents with  . Choking    pt's sister states that the patient has been having choking episodes for about a week and that it got very bad last night  . URI    pt's sister wonders if the pt has a sinus infection because pt's nose has been running   Pt has been up and down all night.  Sister states he is coughing and choking half the night.  This seems to be new for the last 3 days.  Sister also notes some sinus drainage.  Sister is concerned about aspiration but no trouble with eating.  Still taking Nexium.    He has been out of his Lorazepam which he takes BID for agitation.  He used to be on more psychotropics but doing well with this  Relevant past medical, surgical, family and social history reviewed and updated as indicated. Interim medical history since our last visit reviewed. Allergies and medications reviewed and updated.  Review of Systems  Per HPI unless specifically indicated above     Objective:    BP 123/78 (BP Location: Left Arm, Patient Position: Sitting, Cuff Size: Normal)   Pulse 98   Temp 98.2 F (36.8 C)   Ht 5\' 10"  (1.778 m)   Wt 184 lb 12.8 oz (83.8 kg)   SpO2 94%   BMI 26.52 kg/m   Wt Readings from Last 3 Encounters:  03/08/16 184 lb 12.8 oz (83.8 kg)  12/03/15 184 lb 6.4 oz (83.6 kg)  08/27/15 183 lb 12.8 oz (83.4 kg)    Physical Exam  Constitutional: He is oriented to person, place, and time. He appears well-developed and well-nourished. No distress.  HENT:  Head: Normocephalic and atraumatic.  Eyes: Conjunctivae and lids are normal. Right eye exhibits no discharge. Left eye  exhibits no discharge. No scleral icterus.  Neck: Normal range of motion. Neck supple. No JVD present. Carotid bruit is not present.  Cardiovascular: Normal rate, regular rhythm and normal heart sounds.   Pulmonary/Chest: Effort normal and breath sounds normal. No respiratory distress.  Abdominal: Normal appearance. There is no splenomegaly or hepatomegaly.  Musculoskeletal: Normal range of motion.  Neurological: He is alert and oriented to person, place, and time.  Skin: Skin is warm, dry and intact. No rash noted. No pallor.  Psychiatric: He has a normal mood and affect. His behavior is normal. Judgment and thought content normal.    Results for orders placed or performed in visit on 02/10/16  HM DIABETES EYE EXAM  Result Value Ref Range   HM Diabetic Eye Exam No Retinopathy No Retinopathy      Assessment & Plan:   Problem List Items Addressed This Visit      Unprioritized   Barrett's esophagus   Relevant Orders   Ambulatory referral to General Surgery    Other Visit Diagnoses    Need for influenza vaccination    -  Primary   Relevant Orders   Flu Vaccine QUAD 36+ mos IM (Completed)  Acute non-seasonal allergic rhinitis, unspecified trigger       Treat for allergies and see if the probem is post-nasal drip.  Rx for Atrovent which will help dry secrtions   Gagging episode       R/o drainage with Atrovent, refer to GI,  consider aspiration.        ? Problems related to aspiration as issues with dementia.  Will continue to follow  Follow up plan: Return if symptoms worsen or fail to improve.

## 2016-03-16 ENCOUNTER — Other Ambulatory Visit: Payer: Self-pay | Admitting: *Deleted

## 2016-03-16 ENCOUNTER — Other Ambulatory Visit: Payer: Self-pay
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
## 2016-03-16 NOTE — Patient Outreach (Signed)
Phone call successful, follow up on  referral received from [facility] telephonic RN CM today  for community nurse case management services, original MD referral 11/22- reason HTN, RF, decreased ADLs.   Spoke with pt's sister [name] (guardian), HIPAA verified on pt, discussed purpose of call- schedule home visit.   Plan:  As discussed with sister, plan to follow up with pt this week- initial home visit.     Zara Chess.   [place] Care Management  [phone]

## 2016-03-17 ENCOUNTER — Ambulatory Visit: Payer: Self-pay | Admitting: *Deleted

## 2016-03-19 ENCOUNTER — Encounter: Payer: Self-pay | Admitting: *Deleted

## 2016-03-19 ENCOUNTER — Other Ambulatory Visit: Payer: Self-pay | Admitting: *Deleted
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
## 2016-03-23 ENCOUNTER — Encounter: Payer: Self-pay | Admitting: *Deleted

## 2016-03-23 ENCOUNTER — Other Ambulatory Visit: Payer: Self-pay | Admitting: *Deleted

## 2016-03-23 NOTE — Patient Outreach (Signed)
  [facility]) Care Management  [facility] Social Work  03/23/2016  George Glenn 08-13-1960 [id number]  Subjective:  Patient is a 55 year old male.  Patient's sister is his guardian. Per patient's [name], his short term memory is impaired. Marland Kitchen  He also has trouble completing his ADL's and relies on his sister to manage his IADL's. Per patient's sister when he does shower, he gets dizzy and is at risk for falls. Patient has worked with Production designer, theatre/television/film in the past and was placed at  [employer] but did not like it.  Per patient, he is willing to consider an Adult Day Care program to increase his socialization as he has minimal social outlets. Objective:   Encounter Medications:  Outpatient Encounter Prescriptions as of 03/23/2016  Medication Sig Note  . Cyanocobalamin (VITAMIN B 12 PO) Take 1 tablet by mouth daily.  03/19/2016: Pt takes 2500 mcg daily   . docusate sodium (COLACE) 100 MG capsule Take 100 mg by mouth every other day. Pt takes one every other day.   . esomeprazole (NEXIUM) 40 MG capsule Take 1 capsule (40 mg total) by mouth daily.   Marland Kitchen LORazepam (ATIVAN) 0.5 MG tablet TAKE ONE TABLET BY MOUTH TWICE DAILY   . simvastatin (ZOCOR) 10 MG tablet Take 1 tablet (10 mg total) by mouth daily.   Marland Kitchen ipratropium (ATROVENT) 0.06 % nasal spray Place 2 sprays into both nostrils 4 (four) times daily. (Patient not taking: Reported on 03/23/2016)    No facility-administered encounter medications on file as of 03/23/2016.     Functional Status:  In your present state of health, do you have any difficulty performing the following activities: 03/19/2016  Hearing? N  Vision? N  Difficulty concentrating or making decisions? Y  Walking or climbing stairs? N  Dressing or bathing? N  Doing errands, shopping? Y  Preparing Food and eating ? Y  Using the Toilet? N  In the past six months, have you accidently leaked urine? Y  Do you have problems with loss of bowel control? N   Managing your Medications? Y  Managing your Finances? Y  Housekeeping or managing your Housekeeping? Y  Some recent data might be hidden    Fall/Depression Screening:  PHQ 2/9 Scores 03/19/2016 03/16/2016 12/03/2015  PHQ - 2 Score 0 - 0  Exception Documentation - Other- indicate reason in comment box -  Not completed - call completed with caregiver/sister -    Assessment: Patient very friendly during the visit. Patient's sister concerned about his hygiene and  ability to complete his ADL's. Patient's short term memory impaired evidenced by not being able to remember what he ate for lunch just few minutes following his meal. [facility] discussed, contact information provided  as well as a list of private duty aids if needed in the future. The assessment process for personal care services discussed. The paperwork received from patient's doctor's office to request a independent assessment, however the form is missing some information in order to be processed.  This social worker will request the correction and fax the  completed form to [facility] for processing.    Plan:  Completed request form for an Independent assessment for personal care services will be faxed to [facility] once the corrections are made by the providers office.  This Education officer, museum will follow up in approximately 1 month.

## 2016-03-24 ENCOUNTER — Encounter: Payer: Self-pay | Admitting: *Deleted

## 2016-03-24 NOTE — Telephone Encounter (Signed)
This encounter was created in error - please disregard.

## 2016-03-31 ENCOUNTER — Ambulatory Visit (INDEPENDENT_AMBULATORY_CARE_PROVIDER_SITE_OTHER): Payer: Medicare Other | Admitting: Gastroenterology

## 2016-03-31 ENCOUNTER — Other Ambulatory Visit: Payer: Self-pay

## 2016-03-31 ENCOUNTER — Encounter: Payer: Self-pay | Admitting: Gastroenterology

## 2016-03-31 VITALS — BP 106/69 | HR 73 | Temp 97.7°F | Ht 70.0 in | Wt 182.0 lb

## 2016-03-31 DIAGNOSIS — K2270 Barrett's esophagus without dysplasia: Secondary | ICD-10-CM

## 2016-03-31 NOTE — Progress Notes (Signed)
Gastroenterology Consultation  Referring Provider:     Kathrine Haddock, NP Primary Care Physician:  Kathrine Haddock, NP Primary Gastroenterologist:  Dr. [name]  Reason for Consultation:     Derrill Memo esophagus        HPI:   George Glenn is a 55 y.o. y/o male referred for consultation & management  by Dr. Kathrine Haddock, NP.    He is accompanied by his sister who is his POA holder. 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    Per sister was diagnosed with Barrettes esophagus at this hospital  5 years back. Per sister atthat time was commenced on nexium for healing. He has been on it ever since. Denies any symptoms of heartburn presently . Some concern for choking at times which the patient says he was trying to get the phlegm out.   I was unable to obtain a copy of his last EGD.   Past Medical History:  Diagnosis Date  . Anemia   . Glenn's esophagus   . Brain damage   . Chronic kidney disease   . Diabetes mellitus without complication [facility])    sister states he is no longer diabetic due to diet changes and weight loss  . Dizziness    WHEN STANDING  . GERD (gastroesophageal reflux disease)    Glenn's  . Hyperlipidemia   . Hypertension   . Hypogonadism in male   . Memory loss, short term   . Osteoporosis   . Paranoid schizophrenia ([place])   . Tic    HANDS    Past Surgical History:  Procedure Laterality Date  . CATARACT EXTRACTION W/PHACO Right 11/12/2014   Procedure: CATARACT EXTRACTION PHACO AND INTRAOCULAR LENS PLACEMENT (IOC);  Surgeon: Birder Robson, MD;  Location: [facility] ORS;  Service: Ophthalmology;  Laterality: Right;  cassette Lot # [id number]     [place]  00:45 AP 14.7 CDE 6.62  . CATARACT EXTRACTION W/PHACO Left 12/10/2014   Procedure: CATARACT EXTRACTION PHACO AND INTRAOCULAR LENS PLACEMENT (IOC);  Surgeon: Birder Robson, MD;  Location: [facility] ORS;  Service: Ophthalmology;  Laterality: Left;   US:00:46.1 AP:20.0 CDE:9.22 LOT PACK [id number]  . COLONOSCOPY      Prior to Admission medications   Medication Sig Start Date End Date Taking? Authorizing Provider  Cyanocobalamin (VITAMIN B 12 PO) Take 1 tablet by mouth daily.    Yes Historical Provider, MD  docusate sodium (COLACE) 100 MG capsule Take 100 mg by mouth every other day. Pt takes one every other day.   Yes Historical Provider, MD  esomeprazole (NEXIUM) 40 MG capsule Take 1 capsule (40 mg total) by mouth daily. 12/03/15  Yes Kathrine Haddock, NP  LORazepam (ATIVAN) 0.5 MG tablet TAKE ONE TABLET BY MOUTH TWICE DAILY 03/05/16  Yes Kathrine Haddock, NP  simvastatin (ZOCOR) 10 MG tablet Take 1 tablet (10 mg total) by mouth daily. 08/27/15  Yes Kathrine Haddock, NP  ipratropium (ATROVENT) 0.06 % nasal spray Place 2 sprays into both nostrils 4 (four) times daily. Patient not taking: Reported on 03/31/2016 03/08/16   Kathrine Haddock, NP    Family History  Problem Relation Age of Onset  . Hypertension Father   . Diabetes Father   . Heart disease Father   . Hyperlipidemia Father   . Cancer Mother     Skin  . Dementia Mother   . Diabetes Brother   . Stroke Maternal Grandmother   . Stroke Maternal Grandfather   . Heart disease Paternal Grandmother   .  Heart disease Paternal Grandfather      Social History  Substance Use Topics  . Smoking status: Never Smoker  . Smokeless tobacco: Never Used  . Alcohol use No    Allergies as of 03/31/2016  . (No Known Allergies)    Review of Systems:    All systems reviewed and negative except where noted in HPI.   Physical Exam:  BP 106/69   Pulse 73   Temp 97.7 F (36.5 C) (Oral)   Ht 5\' 10"  (1.778 m)   Wt 182 lb (82.6 kg)   BMI 26.11 kg/m  No LMP for male patient. Psych:  Alert and cooperative. Normal mood and affect. General:   Alert,  Well-developed, well-nourished, pleasant and cooperative in NAD Head:  Normocephalic and atraumatic. Eyes:  Sclera clear, no icterus.    Conjunctiva pink. Ears:  Normal auditory acuity. Nose:  No deformity, discharge, or lesions. Mouth:  No deformity or lesions,oropharynx pink & moist. Neck:  Supple; no masses or thyromegaly. Lungs:  Respirations even and unlabored.  Clear throughout to auscultation.   No wheezes, crackles, or rhonchi. No acute distress. Heart:  Regular rate and rhythm; no murmurs, clicks, rubs, or gallops. Abdomen:  Normal bowel sounds.  No bruits.  Soft, non-tender and non-distended without masses, hepatosplenomegaly or hernias noted.  No guarding or rebound tenderness.    Lymph Nodes:  No significant cervical adenopathy. Psych:  Alert and cooperative. Normal mood and affect.  Imaging Studies: No results found.  Assessment and Plan:   George Glenn is a 55 y.o. 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 No report available. He has been on nexium since. Presently has no symptoms. I will plan to repeat EGD, check for presence of barrettes and if absent plan to wean off PPI use.    Follow up in 8 weeks   Dr [name] MD

## 2016-04-02 ENCOUNTER — Other Ambulatory Visit: Payer: Self-pay | Admitting: *Deleted

## 2016-04-02 NOTE — Patient Outreach (Signed)
[facility]) Care Management  04/02/2016  George Glenn June 29, 1960 [id number]   Phone call to patient's sister to follow up on referral for personal care services.  Per patient's sister, [facility] has contacted her and they have an appointment for a independent assessment on 04/09/16 ay 10:30am.   Plan:  This Education officer, museum will follow up with patient's sister to discuss the results of the Independent assessment.  Sheralyn Boatman [facility] Care Management [phone]

## 2016-04-05 ENCOUNTER — Other Ambulatory Visit: Payer: Self-pay | Admitting: Unknown Physician Specialty

## 2016-04-14 ENCOUNTER — Other Ambulatory Visit: Payer: Self-pay | Admitting: *Deleted

## 2016-04-14 NOTE — Patient Outreach (Signed)
Follow up call successful.   Spoke with pt's [name] (on consent form, pt's guardian), HIPAA/identity verified on pt.  RN CM discussed purpose of call to complete missing element- Religious/cultural considerations for pt that would effect his health care to which sister reports none (documentation completed).   [name] reports [facility] came out, waiting to see if pt qualifies for personal care services.  [name] reports pt's short term memory is getting worse.     Plan:  As discussed with pt's [name], plan to follow up with scheduled home visit next month.     Zara Chess.   [place] Care Management  [phone]

## 2016-04-21 ENCOUNTER — Other Ambulatory Visit: Payer: Self-pay | Admitting: *Deleted

## 2016-04-21 ENCOUNTER — Encounter: Payer: Self-pay | Admitting: *Deleted

## 2016-04-21 NOTE — Patient Outreach (Signed)
[facility] [facility]) Care Management   04/21/2016  George Glenn 02/01/1961 [id number]  George Glenn is an 56 y.o. male  Subjective:  [name] (guardian) reports George Glenn came and did an evaluation with pt  for personal  Care services, was told would get back in 2 weeks, no report yet.   Sister reports pt is scheduled  For an EGD 1/5, check on his Barrett's esophagus.  Sister reports no improvement in pt's dizziness, No recent falls.  Pt reports he is careful changing positions from sitting to standing, staying hydrated.      Objective:   Vitals:   04/21/16 1000  BP: 110/80  Pulse: 74  Resp: 16    ROS  Physical Exam  Constitutional: He appears well-developed and well-nourished.  Cardiovascular: Normal rate, regular rhythm and normal heart sounds.   Respiratory: Effort normal and breath sounds normal.  GI: Soft. Bowel sounds are normal.  Musculoskeletal: Normal range of motion.  Neurological: He is alert.  Did not know current year.    Skin: Skin is warm and dry.  Psychiatric: He has a normal mood and affect. His behavior is normal.    Encounter Medications:   Outpatient Encounter Prescriptions as of 04/21/2016  Medication Sig Note  . Cyanocobalamin (VITAMIN B 12 PO) Take 1 tablet by mouth daily.  03/19/2016: Pt takes 2500 mcg daily   . docusate sodium (COLACE) 100 MG capsule Take 100 mg by mouth every other day. Pt takes one every other day.   . esomeprazole (NEXIUM) 40 MG capsule Take 1 capsule (40 mg total) by mouth daily.   Marland Kitchen ipratropium (ATROVENT) 0.06 % nasal spray Place 2 sprays into both nostrils 4 (four) times daily. (Patient not taking: Reported on 04/21/2016)   . LORazepam (ATIVAN) 0.5 MG tablet TAKE 1 TABLET BY MOUTH TWICE DAILY   . simvastatin (ZOCOR) 10 MG tablet Take 1 tablet (10 mg total) by mouth daily.    No facility-administered encounter medications on file as of 04/21/2016.     Functional Status:   In your present state of health, do you  have any difficulty performing the following activities: 03/19/2016  Hearing? N  Vision? N  Difficulty concentrating or making decisions? Y  Walking or climbing stairs? N  Dressing or bathing? N  Doing errands, shopping? Y  Preparing Food and eating ? Y  Using the Toilet? N  In the past six months, have you accidently leaked urine? Y  Do you have problems with loss of bowel control? N  Managing your Medications? Y  Managing your Finances? Y  Housekeeping or managing your Housekeeping? Y  Some recent data might be hidden    Fall/Depression Screening:    PHQ 2/9 Scores 03/19/2016 03/16/2016 12/03/2015  PHQ - 2 Score 0 - 0  Exception Documentation - Other- indicate reason in comment box -  Not completed - call completed with caregiver/sister -    Assessment:  Pleasant 29 year old gentleman, able to provide name/dob but no address or correct  Year.  Lungs clear, no c/o pain.    Orthostatic Hypotension:  BP on left arm 110/80 sitting, had pt stand- able to only obtain systolic reading Of 92, pt did report some dizziness.   Several attempts made on right arm with pt standing, unable to  Obtain.  RN CM discussed with pt/sister use of quad cane as needed when changing positions to Which both agreed.    Plan:  As discussed with sister/pt, plan to discharge pt  from community nurse case management services, No further case management needs.            As discussed, sister to call George Haddock NP- request prescription for quad cane- pt to use as                Needed.           Plan  to inform [facility] CSW  Of pt discharge from community nurse case management services.           Plan to update George Haddock NP about RN CM discharge, [facility] CSW still active.     [facility] CM Care Plan Problem One   Flowsheet Row Most Recent Value  Care Plan Problem One  Orthostatic Hypotension   Role Documenting the Problem One  Care Management Coordinator  Care Plan for Problem One  Active  THN Long Term  Goal (31-90 days)  Pt would use caution changing position from sitting to standing for the next 40 days   THN Long Term Goal Start Date  03/19/16  [facility] Long Term Goal Met Date  04/21/16  THN CM Short Term Goal #1 (0-30 days)  Pt would remain hydrated for the next 30 days   THN CM Short Term Goal #1 Start Date  03/19/16  [facility] CM Short Term Goal #1 Met Date  04/21/16     Zara Chess.   [place] Care Management  [phone]

## 2016-04-21 NOTE — Patient Outreach (Signed)
[facility]) Care Management  04/21/2016  Mahdi Haltiwanger 25-Jun-1960 [id number]   Phone call to patient's guardian Clovia Cuff to follow up on personal care services.  Per [name], patient has been approved for 64 hours per month.  Patient will have an aid assist with his ADL's for 2 hours a day daily.  Per [name], she is not sure if he will need an aid daily, however will start with the amount of hours given. The nurse from [place] will complete her initial assessment and care plan on 04/22/16.     Plan:  This Education officer, museum will follow up with patient's sister within 1 week regarding the progress of the personal care services being provided.     Sheralyn Boatman [facility] Care Management [phone]

## 2016-04-21 NOTE — Patient Outreach (Signed)
Follow up phone call- medication review.   Spoke with [name] (pt's sister/guardian, on [facility] consent form), HIPAA/identity verified on pt.   RN CM reviewed with sister pt's medications, as was  not reviewed during home visit.  Sister reports no recent changes, taking as ordered.       Plan:  RN CM to close case as discussed with pt and sister earlier today at home visit, no further case management needs.    Zara Chess.   [place] Care Management  [phone]

## 2016-04-23 ENCOUNTER — Encounter: Payer: Self-pay | Admitting: *Deleted

## 2016-04-23 ENCOUNTER — Encounter
Admission: RE | Disposition: A | Discharge status: Home / Self Care | Payer: Self-pay | Source: Ambulatory Visit | Attending: Gastroenterology

## 2016-04-23 ENCOUNTER — Ambulatory Visit: Payer: Medicare Other | Admitting: Registered Nurse

## 2016-04-23 ENCOUNTER — Ambulatory Visit
Admission: RE | Admit: 2016-04-23 | Discharge: 2016-04-23 | Disposition: A | Discharge status: Home / Self Care | Payer: Medicare Other | Source: Ambulatory Visit | Attending: Gastroenterology | Admitting: Gastroenterology

## 2016-04-23 DIAGNOSIS — I129 Hypertensive chronic kidney disease with stage 1 through stage 4 chronic kidney disease, or unspecified chronic kidney disease: Secondary | ICD-10-CM | POA: Diagnosis not present

## 2016-04-23 DIAGNOSIS — I10 Essential (primary) hypertension: Secondary | ICD-10-CM | POA: Diagnosis not present

## 2016-04-23 DIAGNOSIS — M810 Age-related osteoporosis without current pathological fracture: Secondary | ICD-10-CM | POA: Insufficient documentation

## 2016-04-23 DIAGNOSIS — K2270 Barrett's esophagus without dysplasia: Secondary | ICD-10-CM | POA: Diagnosis not present

## 2016-04-23 DIAGNOSIS — M1990 Unspecified osteoarthritis, unspecified site: Secondary | ICD-10-CM | POA: Diagnosis not present

## 2016-04-23 DIAGNOSIS — K219 Gastro-esophageal reflux disease without esophagitis: Secondary | ICD-10-CM | POA: Insufficient documentation

## 2016-04-23 DIAGNOSIS — K449 Diaphragmatic hernia without obstruction or gangrene: Secondary | ICD-10-CM | POA: Diagnosis not present

## 2016-04-23 DIAGNOSIS — E785 Hyperlipidemia, unspecified: Secondary | ICD-10-CM | POA: Diagnosis not present

## 2016-04-23 DIAGNOSIS — Z79899 Other long term (current) drug therapy: Secondary | ICD-10-CM | POA: Diagnosis not present

## 2016-04-23 DIAGNOSIS — E1122 Type 2 diabetes mellitus with diabetic chronic kidney disease: Secondary | ICD-10-CM | POA: Diagnosis not present

## 2016-04-23 DIAGNOSIS — N189 Chronic kidney disease, unspecified: Secondary | ICD-10-CM | POA: Insufficient documentation

## 2016-04-23 DIAGNOSIS — F200 Paranoid schizophrenia: Secondary | ICD-10-CM | POA: Diagnosis not present

## 2016-04-23 DIAGNOSIS — R51 Headache: Secondary | ICD-10-CM | POA: Diagnosis not present

## 2016-04-23 DIAGNOSIS — D649 Anemia, unspecified: Secondary | ICD-10-CM | POA: Diagnosis not present

## 2016-04-23 HISTORY — PX: ESOPHAGOGASTRODUODENOSCOPY (EGD) WITH PROPOFOL: SHX5813

## 2016-04-23 SURGERY — ESOPHAGOGASTRODUODENOSCOPY (EGD) WITH PROPOFOL
Anesthesia: General

## 2016-04-23 MED ORDER — MIDAZOLAM HCL 2 MG/2ML IJ SOLN
INTRAMUSCULAR | Status: DC | PRN
  Administered 2016-04-23: 2 mg via INTRAVENOUS

## 2016-04-23 MED ORDER — LIDOCAINE 2% (20 MG/ML) 5 ML SYRINGE
INTRAMUSCULAR | Status: AC
  Filled 2016-04-23: qty 5

## 2016-04-23 MED ORDER — PROPOFOL 500 MG/50ML IV EMUL
INTRAVENOUS | Status: AC
Start: 2016-04-23 — End: 2016-04-23
  Filled 2016-04-23: qty 50

## 2016-04-23 MED ORDER — PROPOFOL 10 MG/ML IV BOLUS
INTRAVENOUS | Status: DC | PRN
Start: 2016-04-23 — End: 2016-04-23
  Administered 2016-04-23: 50 mg via INTRAVENOUS

## 2016-04-23 MED ORDER — PROPOFOL 500 MG/50ML IV EMUL
INTRAVENOUS | Status: DC | PRN
  Administered 2016-04-23: 150 ug/kg/min via INTRAVENOUS

## 2016-04-23 MED ORDER — MIDAZOLAM HCL 2 MG/2ML IJ SOLN
INTRAMUSCULAR | Status: AC
  Filled 2016-04-23: qty 2

## 2016-04-23 MED ORDER — IPRATROPIUM-ALBUTEROL 0.5-2.5 (3) MG/3ML IN SOLN
RESPIRATORY_TRACT | Status: AC
  Filled 2016-04-23: qty 3

## 2016-04-23 MED ORDER — SODIUM CHLORIDE 0.9 % IV SOLN
INTRAVENOUS | Status: DC
  Administered 2016-04-23: 1000 mL via INTRAVENOUS

## 2016-04-23 NOTE — Transfer of Care (Signed)
Immediate Anesthesia Transfer of Care Note  Patient: George Glenn  Procedure(s) Performed: Procedure(s): ESOPHAGOGASTRODUODENOSCOPY (EGD) WITH PROPOFOL (N/A)  Patient Location: PACU  Anesthesia Type:General  Level of Consciousness: awake, alert  and oriented  Airway & Oxygen Therapy: Patient Spontanous Breathing and Patient connected to nasal cannula oxygen  Post-op Assessment: Report given to RN and Post -op Vital signs reviewed and stable  Post vital signs: Reviewed and stable  Last Vitals:  Vitals:   04/23/16 0721 04/23/16 0807  BP: 125/79 111/82  Pulse: 74 86  Resp: 18 15  Temp: 36.2 C (!) 35.6 C    Last Pain:  Vitals:   04/23/16 0807  TempSrc: Tympanic         Complications: No apparent anesthesia complications

## 2016-04-23 NOTE — Op Note (Signed)
[facility] Gastroenterology Patient Name: George Glenn Procedure Date: 04/23/2016 7:42 AM MRN: [id number] Account #: [id number] Date of Birth: [birth year] Admit Type: Ambulatory Age: 56 Room: [facility] ENDO ROOM 4 Gender: Male Note Status: Finalized Procedure:            Upper GI endoscopy Indications:          Barrett's esophagus, Surveillance for malignancy due to                        personal history of Barrett's esophagus Providers:            [name] MD, MD Medicines:            Monitored Anesthesia Care Complications:        No immediate complications. Procedure:            Pre-Anesthesia Assessment:                       - Prior to the procedure, a History and Physical was                        performed, and patient medications, allergies and                        sensitivities were reviewed. The patient's tolerance of                        previous anesthesia was reviewed.                       - The risks and benefits of the procedure and the                        sedation options and risks were discussed with the                        patient. All questions were answered and informed                        consent was obtained.                       - The risks and benefits of the procedure and the                        sedation options and risks were discussed with the                        patient. All questions were answered and informed                        consent was obtained.                       - ASA Grade Assessment: III - A patient with severe                        systemic disease.                       After obtaining informed consent, the endoscope was  passed under direct vision. Throughout the procedure,                        the patient's blood pressure, pulse, and oxygen                        saturations were monitored continuously. The upper GI                        endoscopy was performed with  moderate difficulty due to                        the patient's oxygen desaturation. The patient                        tolerated the procedure poorly due to the patient's                        respiratory instability (hypoxia). The procedure was                        aborted due to the patient's respiratory instability                        (hypoxia). Performing chin lift, administering oxygen                        and receiving assistance from additional staff did not                        allow for the successful completion of the procedure.                        The Endoscope was introduced through the mouth, and                        advanced to the third part of duodenum. Findings:      The examined duodenum was normal.      The stomach was normal.      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      Limited exam due to desaturation. Unable to take biopsies due to oxygen       desaturation and need to abort procedure Impression:           - Normal examined duodenum.                       - Normal stomach.                       - Large paraesophageal hernia.                       - Salmon-colored mucosa suggestive of Barrett's                        esophagus and suggestive of long-segment Barrett's                        esophagus.                       -  No specimens collected. Recommendation:       - Discharge patient to home (with escort).                       - Patient has a contact number available for                        emergencies. The signs and symptoms of potential                        delayed complications were discussed with the patient.                        Return to normal activities tomorrow. Written discharge                        instructions were provided to the patient.                       - Resume previous diet.                       - Continue present medications.                       - Return to my office in 6 weeks.                        - I will need to discuss the risks vs benefits of                        surveillance due to issues with sedation. Any further                        endoscopy may need pre treatment with a bronchodilator                        +/- intubation Procedure Code(s):    --- Professional ---                       [id number], 52, Esophagogastroduodenoscopy, flexible,                        transoral; diagnostic, including collection of                        specimen(s) by brushing or washing, when performed                        (separate procedure) Diagnosis Code(s):    --- Professional ---                       K22.70, Barrett's esophagus without dysplasia                       K44.9, Diaphragmatic hernia without obstruction or                        gangrene CPT copyright 2016 American Medical Association. All rights reserved. The codes documented in this report are preliminary and upon coder review may  be revised to meet  current compliance requirements. [name], MD [name] MD, MD 04/23/2016 8:01:59 AM This report has been signed electronically. Number of Addenda: 0 Note Initiated On: 04/23/2016 7:42 AM      [facility]

## 2016-04-23 NOTE — Anesthesia Preprocedure Evaluation (Addendum)
Anesthesia Evaluation  Patient identified by MRN, date of birth, ID band Patient confused    Reviewed: Allergy & Precautions, H&P , NPO status , Patient's Chart, lab work & pertinent test results  History of Anesthesia Complications Negative for: history of anesthetic complications  Airway Mallampati: III  TM Distance: >3 FB Neck ROM: full    Dental  (+) Poor Dentition, Chipped   Pulmonary neg pulmonary ROS, neg shortness of breath,    Pulmonary exam normal breath sounds clear to auscultation       Cardiovascular hypertension, (-) angina(-) Past MI and (-) DOE Normal cardiovascular exam Rhythm:regular Rate:Normal     Neuro/Psych  Headaches, PSYCHIATRIC DISORDERS (somewhat non verbal) Schizophrenia    GI/Hepatic Neg liver ROS, GERD  Controlled,  Endo/Other  diabetes, Type 2  Renal/GU Renal disease  negative genitourinary   Musculoskeletal  (+) Arthritis ,   Abdominal   Peds  Hematology negative hematology ROS (+)   Anesthesia Other Findings Past Medical History: No date: Anemia No date: Barrett's esophagus No date: Brain damage No date: Chronic kidney disease No date: Diabetes mellitus without complication (HCC)     Comment: sister states he is no longer diabetic due to               diet changes and weight loss No date: Dizziness     Comment: WHEN STANDING No date: GERD (gastroesophageal reflux disease)     Comment: Barrett's No date: Hyperlipidemia No date: Hypertension No date: Hypogonadism in male No date: Memory loss, short term No date: Osteoporosis No date: Paranoid schizophrenia ([place]) No date: Tic     Comment: HANDS  Past Surgical History: 11/12/2014: CATARACT EXTRACTION W/PHACO Right     Comment: Procedure: CATARACT EXTRACTION PHACO AND               INTRAOCULAR LENS PLACEMENT (IOC);  Surgeon:               Birder Robson, MD;  Location: [facility] ORS;                Service: Ophthalmology;   Laterality: Right;                cassette Lot # [id number]     [place]  00:45 AP 14.7               CDE 6.62 12/10/2014: CATARACT EXTRACTION W/PHACO Left     Comment: Procedure: CATARACT EXTRACTION PHACO AND               INTRAOCULAR LENS PLACEMENT ([place]);  Surgeon:               Birder Robson, MD;  Location: [facility] ORS;                Service: Ophthalmology;  Laterality: Left;                US:00:46.1 AP:20.0 CDE:9.22 LOT PACK               [id number] No date: COLONOSCOPY  BMI    Body Mass Index:  25.38 kg/m      Reproductive/Obstetrics negative OB ROS                            Anesthesia Physical Anesthesia Plan  ASA: III  Anesthesia Plan: General   Post-op Pain Management:    Induction:   Airway Management Planned:   Additional Equipment:   Intra-op  Plan:   Post-operative Plan:   Informed Consent: I have reviewed the patients History and Physical, chart, labs and discussed the procedure including the risks, benefits and alternatives for the proposed anesthesia with the patient or authorized representative who has indicated his/her understanding and acceptance.   Dental Advisory Given  Plan Discussed with: Anesthesiologist, CRNA and Surgeon  Anesthesia Plan Comments: (Patient and sister consented )       Anesthesia Quick Evaluation

## 2016-04-23 NOTE — Anesthesia Procedure Notes (Addendum)
Date/Time: 04/23/2016 7:42 AM Performed by: Hedda Slade Oxygen Delivery Method: Nasal cannula

## 2016-04-23 NOTE — Progress Notes (Signed)
See Anesthesia note about desats prior to beginning the procedure.

## 2016-04-23 NOTE — Anesthesia Postprocedure Evaluation (Signed)
Anesthesia Post Note  Patient: George Glenn  Procedure(s) Performed: Procedure(s) (LRB): ESOPHAGOGASTRODUODENOSCOPY (EGD) WITH PROPOFOL (N/A)  Patient location during evaluation: Endoscopy Anesthesia Type: General Level of consciousness: awake and alert Pain management: pain level controlled Vital Signs Assessment: post-procedure vital signs reviewed and stable Respiratory status: spontaneous breathing, nonlabored ventilation, respiratory function stable and patient connected to nasal cannula oxygen Cardiovascular status: blood pressure returned to baseline and stable Postop Assessment: no signs of nausea or vomiting Comments: MD called back to room for patient desaturation after induction prior to scope passage.  Lots of coughing.  No gastric contents seen at any time.  No increased oral secretions.  Patient may have had laryngospasm or bronchospasm.    Patient mask ventilated.  MD anesthesia remaining in room now for the case.   Procedure attempted.  Scope passed. Stomach empty. No gastric contents seen with scope passage. Coughing with scope passage as well.   Slow desaturation in the 80s so scope removed.  Patient mask ventilated with 100% FiO2.  Patient never went apneic during the procedure.  Ventilation problems seemed to be 2/2 coughing.  Dr. Vicente Males reports that he does not want the patient intubated today, that he would like to abort the procedure and re attempt in the future.    Patient may have undiagnosed reactive airway disease.    Further questioning of sister found that patient had URI a few weeks ago (she had not reported this in preOp questioning) that she felt had resolved.  This may have made his airway more reactive.  Patient may benefit from preOp duoneb in the future.  If this does not work then we could intubate him as well.  Instructed sister to call a doctor or 911 if patient has any concerning symptoms or if she has concerns.  She voiced  understanding.     Last Vitals:  Vitals:   04/23/16 0827 04/23/16 0837  BP: 110/81 111/75  Pulse: 89 87  Resp: (!) 21 18  Temp:      Last Pain:  Vitals:   04/23/16 0807  TempSrc: Tympanic                 [name]

## 2016-04-23 NOTE — H&P (Signed)
[name] MD [address] [place], [place] [address] Phone: [phone] Fax : [phone]  Primary Care Physician:  Kathrine Haddock, NP Primary Gastroenterologist:  Dr. [name]   Pre-Procedure History & Physical: HPI:  George Glenn is a 56 y.o. male is here for an endoscopy.   Past Medical History:  Diagnosis Date  . Anemia   . Barrett's esophagus   . Brain damage   . Chronic kidney disease   . Diabetes mellitus without complication [facility])    sister states he is no longer diabetic due to diet changes and weight loss  . Dizziness    WHEN STANDING  . GERD (gastroesophageal reflux disease)    Barrett's  . Hyperlipidemia   . Hypertension   . Hypogonadism in male   . Memory loss, short term   . Osteoporosis   . Paranoid schizophrenia ([place])   . Tic    HANDS    Past Surgical History:  Procedure Laterality Date  . CATARACT EXTRACTION W/PHACO Right 11/12/2014   Procedure: CATARACT EXTRACTION PHACO AND INTRAOCULAR LENS PLACEMENT (IOC);  Surgeon: Birder Robson, MD;  Location: [facility] ORS;  Service: Ophthalmology;  Laterality: Right;  cassette Lot # [id number]     [place]  00:45 AP 14.7 CDE 6.62  . CATARACT EXTRACTION W/PHACO Left 12/10/2014   Procedure: CATARACT EXTRACTION PHACO AND INTRAOCULAR LENS PLACEMENT (IOC);  Surgeon: Birder Robson, MD;  Location: [facility] ORS;  Service: Ophthalmology;  Laterality: Left;  US:00:46.1 AP:20.0 CDE:9.22 LOT PACK [id number]  . COLONOSCOPY      Prior to Admission medications   Medication Sig Start Date End Date Taking? Authorizing Provider  Cyanocobalamin (VITAMIN B 12 PO) Take 1 tablet by mouth daily.     Historical Provider, MD  docusate sodium (COLACE) 100 MG capsule Take 100 mg by mouth every other day. Pt takes one every other day.    Historical Provider, MD  esomeprazole (NEXIUM) 40 MG capsule Take 1 capsule (40 mg total) by mouth daily. 12/03/15   Kathrine Haddock, NP  LORazepam (ATIVAN) 0.5 MG tablet TAKE 1 TABLET BY MOUTH TWICE  DAILY 04/05/16   Kathrine Haddock, NP  simvastatin (ZOCOR) 10 MG tablet Take 1 tablet (10 mg total) by mouth daily. 08/27/15   Kathrine Haddock, NP    Allergies as of 03/31/2016  . (No Known Allergies)    Family History  Problem Relation Age of Onset  . Hypertension Father   . Diabetes Father   . Heart disease Father   . Hyperlipidemia Father   . Cancer Mother     Skin  . Dementia Mother   . Diabetes Brother   . Stroke Maternal Grandmother   . Stroke Maternal Grandfather   . Heart disease Paternal Grandmother   . Heart disease Paternal Grandfather     Social History   Social History  . Marital status: Single    Spouse name: N/A  . Number of children: N/A  . Years of education: N/A   Occupational History  . Not on file.   Social History Main Topics  . Smoking status: Never Smoker  . Smokeless tobacco: Never Used  . Alcohol use No  . Drug use: No  . Sexual activity: No   Other Topics Concern  . Not on file   Social History Narrative  . No narrative on file    Review of Systems: See HPI, otherwise negative ROS  Physical Exam: BP 125/79   Pulse 74   Temp 97.1 F (36.2 C) (Tympanic)  Resp 18   Ht 5\' 11"  (1.803 m)   Wt 182 lb (82.6 kg)   SpO2 98%   BMI 25.38 kg/m  General:   Alert,  pleasant and cooperative in NAD Head:  Normocephalic and atraumatic. Neck:  Supple; no masses or thyromegaly. Lungs:  Clear throughout to auscultation.    Heart:  Regular rate and rhythm. Abdomen:  Soft, nontender and nondistended. Normal bowel sounds, without guarding, and without rebound.   Neurologic:  Alert and  oriented x4;  grossly normal neurologically.  Impression/Plan: George Glenn is here for an endoscopy to be performed for surveillance of Barrettes   Risks, benefits, limitations, and alternatives regarding  endoscopy have been reviewed with the patient.  Questions have been answered.  All parties agreeable.   [name], MD  04/23/2016, 7:35 AM

## 2016-04-26 ENCOUNTER — Other Ambulatory Visit: Payer: Self-pay | Admitting: *Deleted

## 2016-04-26 ENCOUNTER — Encounter: Payer: Self-pay | Admitting: *Deleted

## 2016-04-26 ENCOUNTER — Ambulatory Visit: Payer: Self-pay | Admitting: *Deleted

## 2016-04-26 NOTE — Patient Outreach (Signed)
[facility]) Care Management  04/26/2016  Emmanual Ragans 15-May-1960 [id number]   Phone call to patient's sister (legal guardian) to follow up on the nursing assessment for personal care services.  Per patient's sister, the assessment went fine.  Patient authorized for 62 hours per month. Personal care aid to start on 04/28/16.  Recommendation for the [employer] [facility] discussed. Contact information previously provided. Per patient's sister, she will look into it. Patient's sister verbalized having no additional social work needs. Patient's case to be closed at this time.   Sheralyn Boatman [facility] Care Management [phone]

## 2016-05-10 ENCOUNTER — Other Ambulatory Visit: Payer: Self-pay | Admitting: Unknown Physician Specialty

## 2016-06-08 ENCOUNTER — Ambulatory Visit (INDEPENDENT_AMBULATORY_CARE_PROVIDER_SITE_OTHER): Payer: Medicare Other | Admitting: Unknown Physician Specialty

## 2016-06-08 ENCOUNTER — Encounter: Payer: Self-pay | Admitting: Unknown Physician Specialty

## 2016-06-08 VITALS — BP 125/83 | HR 59 | Temp 97.9°F | Ht 70.1 in | Wt 182.2 lb

## 2016-06-08 DIAGNOSIS — E119 Type 2 diabetes mellitus without complications: Secondary | ICD-10-CM

## 2016-06-08 DIAGNOSIS — Z789 Other specified health status: Secondary | ICD-10-CM | POA: Diagnosis not present

## 2016-06-08 DIAGNOSIS — K2270 Barrett's esophagus without dysplasia: Secondary | ICD-10-CM | POA: Diagnosis not present

## 2016-06-08 DIAGNOSIS — F200 Paranoid schizophrenia: Secondary | ICD-10-CM

## 2016-06-08 DIAGNOSIS — E782 Mixed hyperlipidemia: Secondary | ICD-10-CM

## 2016-06-08 DIAGNOSIS — Z7189 Other specified counseling: Secondary | ICD-10-CM | POA: Insufficient documentation

## 2016-06-08 DIAGNOSIS — R413 Other amnesia: Secondary | ICD-10-CM

## 2016-06-08 DIAGNOSIS — Z0000 Encounter for general adult medical examination without abnormal findings: Secondary | ICD-10-CM

## 2016-06-08 DIAGNOSIS — R351 Nocturia: Secondary | ICD-10-CM | POA: Diagnosis not present

## 2016-06-08 DIAGNOSIS — R7301 Impaired fasting glucose: Secondary | ICD-10-CM | POA: Insufficient documentation

## 2016-06-08 DIAGNOSIS — I10 Essential (primary) hypertension: Secondary | ICD-10-CM | POA: Diagnosis not present

## 2016-06-08 LAB — MICROALBUMIN, URINE WAIVED
Creatinine, Urine Waived: 200 mg/dL (ref 10–300)
Microalb, Ur Waived: 30 mg/L — ABNORMAL HIGH (ref 0–19)

## 2016-06-08 LAB — BAYER DCA HB A1C WAIVED: HB A1C: 5.9 % (ref <7.0)

## 2016-06-08 NOTE — Assessment & Plan Note (Signed)
Per GI

## 2016-06-08 NOTE — Progress Notes (Signed)
BP 125/83 (BP Location: Left Arm, Patient Position: Sitting, Cuff Size: Normal)   Pulse (!) 59   Temp 97.9 F (36.6 C)   Ht 5' 10.1" (1.781 m)   Wt 182 lb 3.2 oz (82.6 kg)   SpO2 97%   BMI 26.07 kg/m    Subjective:    Patient ID: George Glenn, male    DOB: Oct 22, 1960, 56 y.o.   MRN: [id number]  HPI: George Glenn is a 56 y.o. male  Chief Complaint  Patient presents with  . Medicare Wellness   Pt has a home health aid during the day: Functional Status Survey: Does the patient have difficulty seeing, even when wearing glasses/contacts?: No Does the patient have difficulty concentrating, remembering, or making decisions?: Yes Does the patient have difficulty walking or climbing stairs?: No Does the patient have difficulty dressing or bathing?: No Does the patient have difficulty doing errands alone such as visiting a doctor's office or shopping?: Yes  Fall Risk  06/08/2016 03/16/2016 12/03/2015  Falls in the past year? No No No  Risk for fall due to : - Mental status change -   Depression screen [facility] 2/9 06/08/2016 03/19/2016 12/03/2015  Decreased Interest 0 0 0  Down, Depressed, Hopeless 0 0 0  PHQ - 2 Score 0 0 0   Diabetes: Using medications without difficulties No hypoglycemic episodes No hyperglycemic episodes Feet problems: none eye exam within last year Last Hgb A1C: 6.4  Hypertension  On no medications for this  Using medication without problems or lightheadedness No chest pain with exertion or shortness of breath No Edema  Elevated Cholesterol Using medications without problems No Muscle aches  Diet: Eating well  Exercise: Not exercising  Schizophrenia Maintained on just Ativan.     Unable to do mental status exam.  Known cognitive impairment due to brain damage  He has had an evaluation by eye professional, cardiology, neurology, orthopedics (for knee), podiatry for toenail, vascular, and GI  Relevant past medical, surgical, family and social  history reviewed and updated as indicated. Interim medical history since our last visit reviewed. Allergies and medications reviewed and updated.  Review of Systems  Genitourinary:       Frequent urination and is up about 4-5 times/night    Per HPI unless specifically indicated above     Objective:    BP 125/83 (BP Location: Left Arm, Patient Position: Sitting, Cuff Size: Normal)   Pulse (!) 59   Temp 97.9 F (36.6 C)   Ht 5' 10.1" (1.781 m)   Wt 182 lb 3.2 oz (82.6 kg)   SpO2 97%   BMI 26.07 kg/m   Wt Readings from Last 3 Encounters:  06/08/16 182 lb 3.2 oz (82.6 kg)  04/23/16 182 lb (82.6 kg)  04/21/16 182 lb (82.6 kg)    Physical Exam  Constitutional: He is oriented to person, place, and time. He appears well-developed and well-nourished. No distress.  HENT:  Head: Normocephalic and atraumatic.  Eyes: Conjunctivae and lids are normal. Right eye exhibits no discharge. Left eye exhibits no discharge. No scleral icterus.  Neck: Normal range of motion. Neck supple. No JVD present. Carotid bruit is not present.  Cardiovascular: Normal rate, regular rhythm and normal heart sounds.   Pulmonary/Chest: Effort normal and breath sounds normal. No respiratory distress.  Abdominal: Normal appearance. There is no splenomegaly or hepatomegaly.  Musculoskeletal: Normal range of motion.  Neurological: He is alert and oriented to person, place, and time.  Skin: Skin is warm, dry  and intact. No rash noted. No pallor.  Psychiatric: He has a normal mood and affect. His behavior is normal. Judgment and thought content normal.   Results for orders placed or performed in visit on 02/10/16  HM DIABETES EYE EXAM  Result Value Ref Range   HM Diabetic Eye Exam No Retinopathy No Retinopathy      Assessment & Plan:   Problem List Items Addressed This Visit      Unprioritized   Advanced care planning/counseling discussion    Sister [name] is the health care power of attorney.  She will bring  in forms to be scanned.  However, she feels it should be redone.  Since patient is not competent she will work on [employer].         Barrett's esophagus    Per GI      Controlled type 2 diabetes mellitus without complication, without long-term current use of insulin (HCC)    Check Hgb A1C.  Diet controlled      Relevant Orders   Microalbumin, Urine Waived   [facility] [place] Hb A1c Waived   Decreased activities of daily living (ADL)    Doing well with aide.  Unable to care for self      Hyperlipidemia    Check lipid panel      Relevant Orders   Lipid Panel w/o Chol/HDL Ratio   RESOLVED: Hypertension    Stable       Relevant Orders   Comprehensive metabolic panel   Lipid Panel w/o Chol/HDL Ratio   Memory loss    Followed by neurology      Paranoid schizophrenia ([place])    Stable        Other Visit Diagnoses    Nocturia    -  Primary   Relevant Orders   PSA   Annual physical exam           Follow up plan: Return in about 6 months (around 12/06/2016).

## 2016-06-08 NOTE — Assessment & Plan Note (Signed)
Sister [name] is the health care power of attorney.  She will bring in forms to be scanned.  However, she feels it should be redone.  Since patient is not competent she will work on [employer].

## 2016-06-08 NOTE — Assessment & Plan Note (Signed)
Stable

## 2016-06-08 NOTE — Assessment & Plan Note (Signed)
Doing well with aide.  Unable to care for self

## 2016-06-08 NOTE — Assessment & Plan Note (Signed)
Followed by neurology.   

## 2016-06-08 NOTE — Assessment & Plan Note (Signed)
Check Hgb A1C.  Diet controlled

## 2016-06-08 NOTE — Assessment & Plan Note (Signed)
Check lipid panel  

## 2016-06-09 ENCOUNTER — Encounter: Payer: Self-pay | Admitting: Unknown Physician Specialty

## 2016-06-09 LAB — COMPREHENSIVE METABOLIC PANEL
ALBUMIN: 4.4 g/dL (ref 3.5–5.5)
ALK PHOS: 90 IU/L (ref 39–117)
ALT: 45 IU/L — AB (ref 0–44)
AST: 35 IU/L (ref 0–40)
Albumin/Globulin Ratio: 1.5 (ref 1.2–2.2)
BILIRUBIN TOTAL: 0.8 mg/dL (ref 0.0–1.2)
BUN / CREAT RATIO: 14 (ref 9–20)
BUN: 14 mg/dL (ref 6–24)
CHLORIDE: 100 mmol/L (ref 96–106)
CO2: 21 mmol/L (ref 18–29)
CREATININE: 1 mg/dL (ref 0.76–1.27)
Calcium: 9.2 mg/dL (ref 8.7–10.2)
GFR calc non Af Amer: 84 (ref >59)
GFR, EST AFRICAN AMERICAN: 97 (ref >59)
GLUCOSE: 85 mg/dL (ref 65–99)
Globulin, Total: 2.9 (ref 1.5–4.5)
Potassium: 4.4 mmol/L (ref 3.5–5.2)
Sodium: 140 mmol/L (ref 134–144)
TOTAL PROTEIN: 7.3 g/dL (ref 6.0–8.5)

## 2016-06-09 LAB — LIPID PANEL W/O CHOL/HDL RATIO
CHOLESTEROL TOTAL: 131 mg/dL (ref 100–199)
HDL: 27 mg/dL — AB (ref >39)
LDL CALC: 83 (ref 0–99)
Triglycerides: 106 mg/dL (ref 0–149)
VLDL CHOLESTEROL CAL: 21 (ref 5–40)

## 2016-06-09 LAB — PSA: Prostate Specific Ag, Serum: 0.7 ng/mL (ref 0.0–4.0)

## 2016-06-09 NOTE — Progress Notes (Signed)
Normal labs.  Patient notified by letter.

## 2016-06-11 ENCOUNTER — Other Ambulatory Visit: Payer: Self-pay | Admitting: Unknown Physician Specialty

## 2016-07-19 ENCOUNTER — Other Ambulatory Visit: Payer: Self-pay | Admitting: Unknown Physician Specialty

## 2016-07-22 ENCOUNTER — Ambulatory Visit: Payer: Medicare Other | Admitting: Gastroenterology

## 2016-08-16 ENCOUNTER — Other Ambulatory Visit: Payer: Self-pay | Admitting: Unknown Physician Specialty

## 2016-08-16 ENCOUNTER — Ambulatory Visit: Payer: Medicare Other | Admitting: Gastroenterology

## 2016-08-16 ENCOUNTER — Encounter: Payer: Self-pay | Admitting: Gastroenterology

## 2016-08-16 NOTE — Progress Notes (Deleted)
He is here today for follow up he was last seen in 03/2016 when was referred to follow up for Barrettes. Esophagus . At that time I did not have an EGD report to determine if he truly had Barrettes esophagus so I went ahead with an EGD on 04/2016 . He had a large type 2 paraesophageal hernia, in addition I did not a salmon colored mucosa of the esophagus suggestive of barrettes esophagus, before I could obtain any biopsies he desaturated in his oxygen levels and we had to abort the procedure.   Since the procedure he is doing ****    Discussed options of repeat endoscopy with possible intubation, prior bronchodilator therapy vs no further procedure . He chose to ****

## 2016-10-11 ENCOUNTER — Other Ambulatory Visit: Payer: Self-pay | Admitting: Unknown Physician Specialty

## 2016-12-07 ENCOUNTER — Other Ambulatory Visit: Payer: Self-pay | Admitting: Unknown Physician Specialty

## 2016-12-09 ENCOUNTER — Other Ambulatory Visit: Payer: Self-pay | Admitting: Unknown Physician Specialty

## 2016-12-10 ENCOUNTER — Telehealth: Payer: Self-pay | Admitting: Unknown Physician Specialty

## 2016-12-10 NOTE — Telephone Encounter (Signed)
Prescription already written and faxed to [facility]. Called and let [name] know that prescription was faxed.

## 2016-12-10 NOTE — Telephone Encounter (Signed)
Patient needing a refill on medication for lorazepam sent to [facility] in [place], [place].  Informed that the pharmacy sent a request but did not receive a response so informed patient to call office.  Please Advise.  Thank you

## 2017-01-03 ENCOUNTER — Other Ambulatory Visit: Payer: Self-pay | Admitting: Unknown Physician Specialty

## 2017-01-31 ENCOUNTER — Other Ambulatory Visit: Payer: Self-pay | Admitting: Unknown Physician Specialty

## 2017-01-31 NOTE — Telephone Encounter (Signed)
Called Lorazepam into [facility].

## 2017-04-04 ENCOUNTER — Other Ambulatory Visit: Payer: Self-pay | Admitting: Unknown Physician Specialty

## 2017-04-25 ENCOUNTER — Other Ambulatory Visit: Payer: Self-pay | Admitting: Unknown Physician Specialty

## 2017-05-03 IMAGING — DX DG CHEST 2V
3 series · 3 of 3 positions shown · non-contrast
Comparison: No previous studies available in PACs.

CLINICAL DATA: Weight loss, no other symptoms. History of
hypertension, chronic renal insufficiency, diabetes, nonsmoker.

EXAM:
CHEST  2 VIEW

[chest pa (1 of 2)]
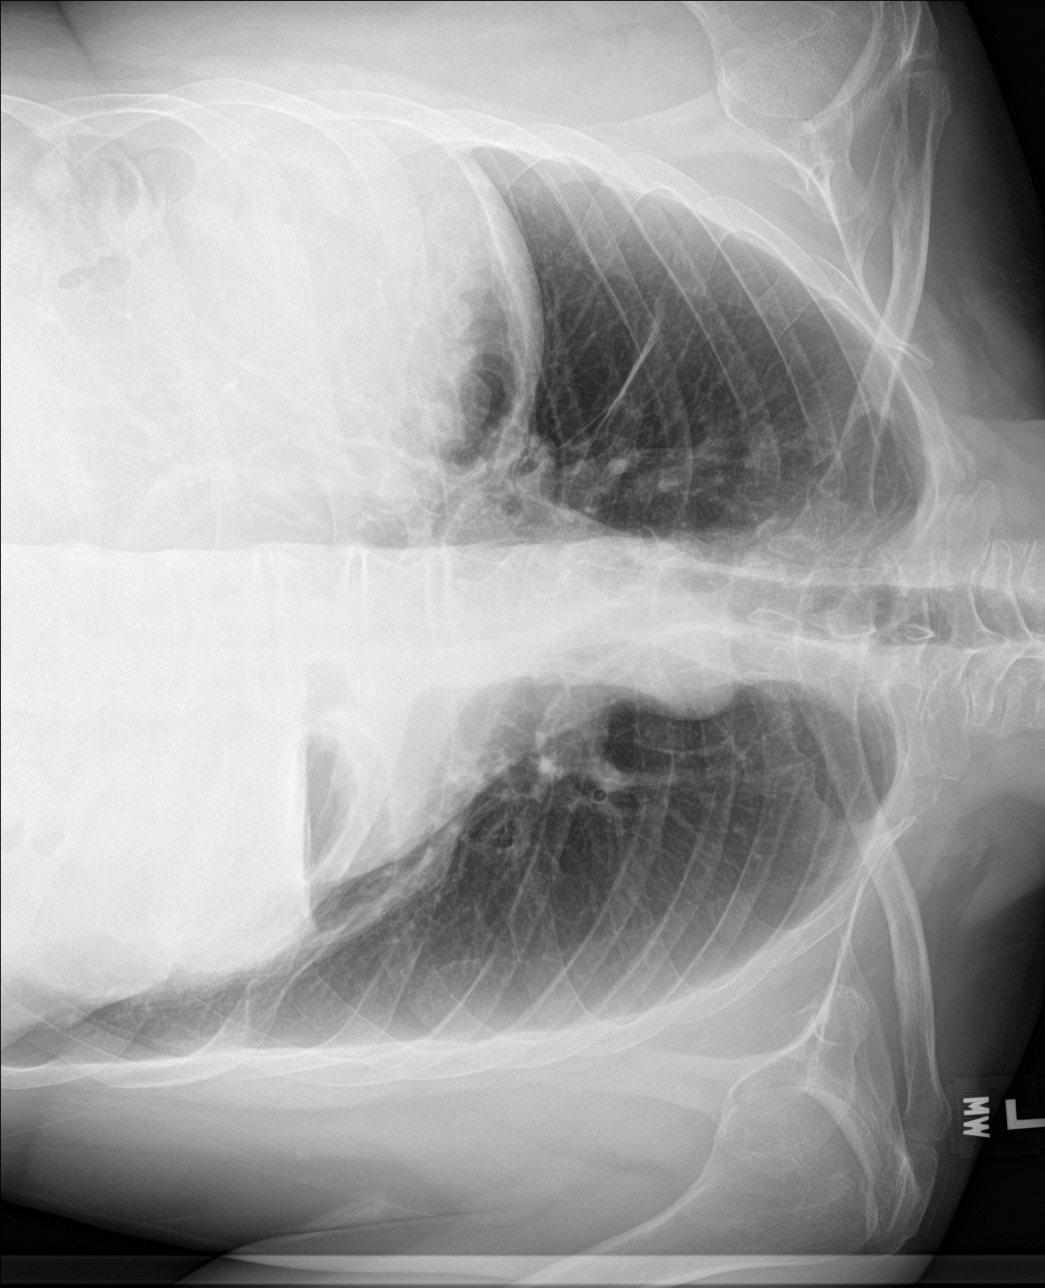

[chest lat]
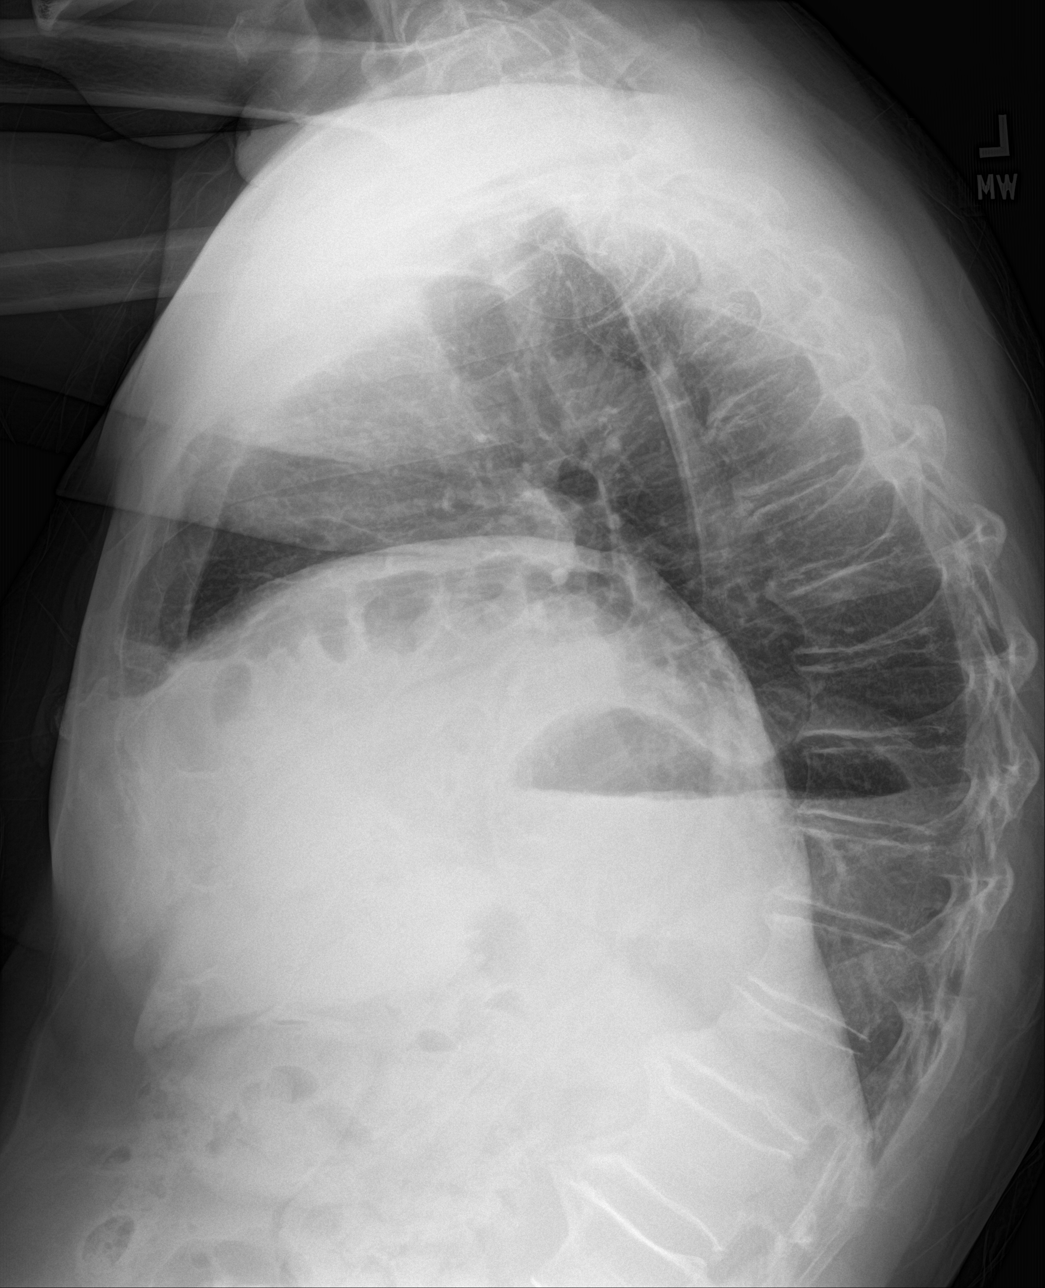

[chest pa (2 of 2)]
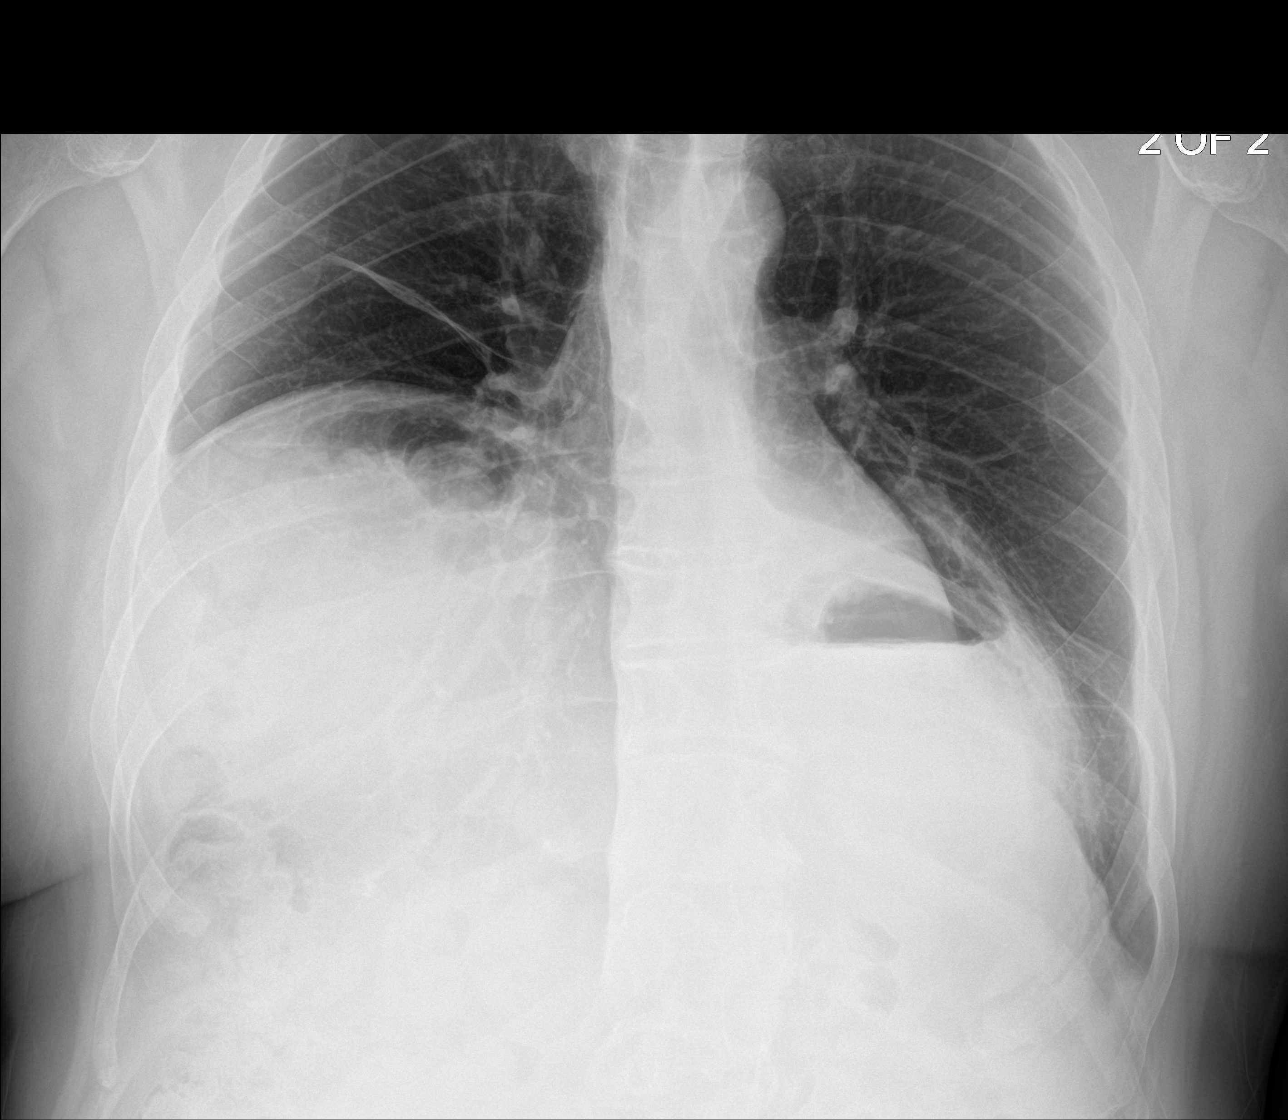

[3 of 3 positions shown; findings below may reference images not displayed]

FINDINGS: There is marked elevation of the right hemidiaphragm. The right
colon is interposed between the diaphragm and the dome of the liver.
There is a moderate-sized hiatal hernia on the left. There is no
pleural effusion or alveolar infiltrate. The heart is normal in size
where visualized. There is prominent thoracic kyphosis without
compression fracture.
IMPRESSION: 1. Large hiatal hernia-partially intra thoracic stomach.
2. Elevation of the right hemidiaphragm of uncertain duration an
etiology.
3. No evidence of CHF nor pneumonia.

## 2017-05-18 ENCOUNTER — Telehealth: Payer: Self-pay

## 2017-05-18 NOTE — Telephone Encounter (Signed)
Copied from [place] [id number]. Topic: Medicare AWV >> May 18, 2017  1:08 PM [place], [place] A, LPN wrote: Reason for CRM: Called to schedule Medicare Annual Wellness Visit with [facility] at [facility]. Can be scheduled anytime after 06/09/2017.  Any questions please transfer call to [place] at [facility].

## 2017-07-14 ENCOUNTER — Ambulatory Visit (INDEPENDENT_AMBULATORY_CARE_PROVIDER_SITE_OTHER): Payer: Medicare Other

## 2017-07-14 VITALS — BP 114/78 | HR 100 | Temp 98.2°F | Resp 16 | Ht 70.0 in | Wt 193.1 lb

## 2017-07-14 DIAGNOSIS — Z114 Encounter for screening for human immunodeficiency virus [HIV]: Secondary | ICD-10-CM | POA: Diagnosis not present

## 2017-07-14 DIAGNOSIS — Z0000 Encounter for general adult medical examination without abnormal findings: Secondary | ICD-10-CM | POA: Diagnosis not present

## 2017-07-14 NOTE — Progress Notes (Signed)
Subjective:   George Glenn is a 57 y.o. male who presents for Medicare Annual/Subsequent preventive examination.  Review of Systems:       Objective:    Vitals: BP 114/78 (BP Location: Left Arm, Patient Position: Sitting)   Pulse 100   Temp 98.2 F (36.8 C) (Temporal)   Resp 16   Ht 5\' 10"  (1.778 m)   Wt 193 lb 1.6 oz (87.6 kg)   BMI 27.71 kg/m   Body mass index is 27.71 kg/m.  Advanced Directives 06/08/2016 03/19/2016 03/16/2016 12/10/2014  Does Patient Have a Medical Advance Directive? Yes Yes Yes Yes  Type of Paramedic of [place];Living will Healthcare Power of [place]  Does patient want to make changes to medical advance directive? - No - Patient declined - No - Patient declined  Copy of [place] in Chart? - (No Data) No - copy requested No - copy requested  Would patient like information on creating a medical advance directive? - - - Yes - Scientist, clinical (histocompatibility and immunogenetics) given    Tobacco Social History   Tobacco Use  Smoking Status Never Smoker  Smokeless Tobacco Never Used     Counseling given: Not Answered   Clinical Intake:  Pre-visit preparation completed: Yes  Pain : No/denies pain     Nutritional Status: BMI 25 -29 Overweight Nutritional Risks: None Diabetes: No  How often do you need to have someone help you when you read instructions, pamphlets, or other written materials from your doctor or pharmacy?: 5 - Always What is the last grade level you completed in school?: 12th grade  Interpreter Needed?: No  Information entered by :: George Hill,LPN   Past Medical History:  Diagnosis Date  . Anemia   . Barrett's esophagus   . Brain damage   . Chronic kidney disease   . Diabetes mellitus without complication [facility])    sister states he is no longer diabetic due to diet changes and weight loss  . Dizziness    WHEN STANDING  . GERD  (gastroesophageal reflux disease)    Barrett's  . Hyperlipidemia   . Hypertension   . Hypogonadism in male   . Memory loss, short term   . Osteoporosis   . Paranoid schizophrenia ([place])   . Tic    HANDS   Past Surgical History:  Procedure Laterality Date  . CATARACT EXTRACTION W/PHACO Right 11/12/2014   Procedure: CATARACT EXTRACTION PHACO AND INTRAOCULAR LENS PLACEMENT (IOC);  Surgeon: Birder Robson, MD;  Location: [facility] ORS;  Service: Ophthalmology;  Laterality: Right;  cassette Lot # [id number]     [place]  00:45 AP 14.7 CDE 6.62  . CATARACT EXTRACTION W/PHACO Left 12/10/2014   Procedure: CATARACT EXTRACTION PHACO AND INTRAOCULAR LENS PLACEMENT (IOC);  Surgeon: Birder Robson, MD;  Location: [facility] ORS;  Service: Ophthalmology;  Laterality: Left;  US:00:46.1 AP:20.0 CDE:9.22 LOT PACK #[id number]  . COLONOSCOPY    . ESOPHAGOGASTRODUODENOSCOPY (EGD) WITH PROPOFOL N/A 04/23/2016   Procedure: ESOPHAGOGASTRODUODENOSCOPY (EGD) WITH PROPOFOL;  Surgeon: [name], MD;  Location: [facility] ENDOSCOPY;  Service: Endoscopy;  Laterality: N/A;   Family History  Problem Relation Age of Onset  . Hypertension Father   . Diabetes Father   . Heart disease Father   . Hyperlipidemia Father   . Cancer Mother        Skin  . Dementia Mother   . Diabetes Brother   . Stroke Maternal Grandmother   . Stroke  Maternal Grandfather   . Heart disease Paternal Grandmother   . Heart disease Paternal Grandfather    Social History   Socioeconomic History  . Marital status: Single    Spouse name: Not on file  . Number of children: Not on file  . Years of education: Not on file  . Highest education level: Not on file  Occupational History  . Not on file  Social Needs  . Financial resource strain: Not hard at all  . Food insecurity:    Worry: Never true    Inability: Never true  . Transportation needs:    Medical: No    Non-medical: No  Tobacco Use  . Smoking status: Never Smoker  . Smokeless tobacco: Never  Used  Substance and Sexual Activity  . Alcohol use: No    Alcohol/week: 0.0 oz  . Drug use: No  . Sexual activity: Never  Lifestyle  . Physical activity:    Days per week: 0 days    Minutes per session: 0 min  . Stress: Not at all  Relationships  . Social connections:    Talks on phone: Never    Gets together: More than three times a week    Attends religious service: Never    Active member of club or organization: No    Attends meetings of clubs or organizations: Never    Relationship status: Never married  Other Topics Concern  . Not on file  Social History Narrative  . Not on file    Outpatient Encounter Medications as of 07/14/2017  Medication Sig  . Cyanocobalamin (VITAMIN B 12 PO) Take 1 tablet by mouth daily.   Marland Kitchen docusate sodium (COLACE) 100 MG capsule Take 100 mg by mouth every other day. Pt takes one every other day.  . esomeprazole (NEXIUM) 40 MG capsule TAKE 1 CAPSULE BY MOUTH EVERY DAY.  . LORazepam (ATIVAN) 0.5 MG tablet TAKE 1 TABLET BY MOUTH TWICE DAILY  . simvastatin (ZOCOR) 10 MG tablet TAKE 1 TABLET BY MOUTH EVERY DAY   No facility-administered encounter medications on file as of 07/14/2017.     Activities of Daily Living In your present state of health, do you have any difficulty performing the following activities: 07/14/2017  Hearing? N  Vision? N  Difficulty concentrating or making decisions? Y  Walking or climbing stairs? N  Dressing or bathing? Y  Comment has caregiver  Doing errands, shopping? Y  Preparing Food and eating ? Y  Comment sister prepares food  Using the Toilet? N  In the past six months, have you accidently leaked urine? N  Do you have problems with loss of bowel control? N  Managing your Medications? Y  Managing your Finances? Y  Housekeeping or managing your Housekeeping? Y  Some recent data might be hidden    Patient Care Team: Kathrine Haddock, NP as PCP - General (Nurse Practitioner) Anabel Bene, MD as Referring  Physician (Neurology) Teodoro Spray, MD as Consulting Physician (Cardiology)   Assessment:   This is a routine wellness examination for George Glenn.  Exercise Activities and Dietary recommendations Current Exercise Habits: The patient does not participate in regular exercise at present, Exercise limited by: None identified  Goals    . DIET - INCREASE WATER INTAKE     Recommend drinking at least 6-8 glasses of water a day        Fall Risk Fall Risk  07/14/2017 06/08/2016 03/16/2016 12/03/2015  Falls in the past year? No No No No  Risk for fall due to : - - Mental status change -   Is the patient's home free of loose throw rugs in walkways, pet beds, electrical cords, etc?   yes      Grab bars in the bathroom? yes      Handrails on the stairs?   yes      Adequate lighting?   yes  Timed Get Up and Go Performed: Completed in 8 seconds with no use of assistive devices, steady gait. No intervention needed at this time.   Depression Screen PHQ 2/9 Scores 07/14/2017 06/08/2016 03/19/2016 03/16/2016  PHQ - 2 Score 0 0 0 -  Exception Documentation - - - Other- indicate reason in comment box  Not completed - - - call completed with caregiver/sister    Cognitive Function        Immunization History  Administered Date(s) Administered  . Influenza,inj,Quad PF,6+ Mos 03/08/2016  . Pneumococcal Polysaccharide-23 03/22/2011    Qualifies for Shingles Vaccine? Yes, discussed shingrix vaccine   Screening Tests Health Maintenance  Topic Date Due  . HIV Screening  12/04/1974  . TETANUS/TDAP  10/17/2012  . OPHTHALMOLOGY EXAM  09/11/2015  . INFLUENZA VACCINE  11/17/2016  . HEMOGLOBIN A1C  12/06/2016  . FOOT EXAM  06/08/2017  . URINE MICROALBUMIN  06/08/2017  . PNEUMOCOCCAL POLYSACCHARIDE VACCINE (2) 04/19/2025 (Originally 03/21/2016)  . COLONOSCOPY  10/29/2017  . Hepatitis C Screening  Completed   Cancer Screenings: Lung: Low Dose CT Chest recommended if Age 32-80 years, 30 pack-year  currently smoking OR have quit w/in 15years. Patient does not qualify. Colorectal: completed   Additional Screenings:  Hepatitis B/HIV/Syphillis:will order for next labs  Hepatitis C Screening: completed 07/30/2015    Plan:    I have personally reviewed and addressed the Medicare Annual Wellness questionnaire and have noted the following in the patient's chart:  A. Medical and social history B. Use of alcohol, tobacco or illicit drugs  C. Current medications and supplements D. Functional ability and status E.  Nutritional status F.  Physical activity G. Advance directives H. List of other physicians I.  Hospitalizations, surgeries, and ER visits in previous 12 months J.  [place] such as hearing and vision if needed, cognitive and depression L. Referrals and appointments   In addition, I have reviewed and discussed with patient certain preventive protocols, quality metrics, and best practice recommendations. A written personalized care plan for preventive services as well as general preventive health recommendations were provided to patient.   Signed,  Tyler Aas, LPN Nurse Health Advisor   Nurse Notes:advised to make appt for physical with Regino Schultze

## 2017-07-14 NOTE — Patient Instructions (Signed)
George Glenn , Thank you for taking time to come for your Medicare Wellness Visit. I appreciate your ongoing commitment to your health goals. Please review the following plan we discussed and let me know if I can assist you in the future.   Screening recommendations/referrals: Colonoscopy: completed 11/15/2007 Recommended yearly ophthalmology/optometry visit for glaucoma screening and checkup Recommended yearly dental visit for hygiene and checkup  Vaccinations: Influenza vaccine: postponed until 12/2017 Pneumococcal vaccine: up to date Tdap vaccine: due, check with your insurance company for coverage  Shingles vaccine: eligible, check with your insurance company for coverage     Advanced directives: Please bring a copy of your health care power of attorney and living will to the office at your convenience.  Conditions/risks identified: recommend to continue drinking at least 6-8 glasses of water a day   Next appointment: Follow up in one year for your annual wellness exam. Follow up with Regino Schultze for your physical.   Preventive Care 40-64 Years, Male Preventive care refers to lifestyle choices and visits with your health care provider that can promote health and wellness. What does preventive care include?  A yearly physical exam. This is also called an annual well check.  Dental exams once or twice a year.  Routine eye exams. Ask your health care provider how often you should have your eyes checked.  Personal lifestyle choices, including:  Daily care of your teeth and gums.  Regular physical activity.  Eating a healthy diet.  Avoiding tobacco and drug use.  Limiting alcohol use.  Practicing safe sex.  Taking low-dose aspirin every day starting at age 22. What happens during an annual well check? The services and screenings done by your health care provider during your annual well check will depend on your age, overall health, lifestyle risk factors, and family  history of disease. Counseling  Your health care provider may ask you questions about your:  Alcohol use.  Tobacco use.  Drug use.  Emotional well-being.  Home and relationship well-being.  Sexual activity.  Eating habits.  Work and work Statistician. Screening  You may have the following tests or measurements:  Height, weight, and BMI.  Blood pressure.  Lipid and cholesterol levels. These may be checked every 5 years, or more frequently if you are over 3 years old.  Skin check.  Lung cancer screening. You may have this screening every year starting at age 49 if you have a 30-pack-year history of smoking and currently smoke or have quit within the past 15 years.  Fecal occult blood test (FOBT) of the stool. You may have this test every year starting at age 42.  Flexible sigmoidoscopy or colonoscopy. You may have a sigmoidoscopy every 5 years or a colonoscopy every 10 years starting at age 53.  Prostate cancer screening. Recommendations will vary depending on your family history and other risks.  Hepatitis C blood test.  Hepatitis B blood test.  Sexually transmitted disease (STD) testing.  Diabetes screening. This is done by checking your blood sugar (glucose) after you have not eaten for a while (fasting). You may have this done every 1-3 years. Discuss your test results, treatment options, and if necessary, the need for more tests with your health care provider. Vaccines  Your health care provider may recommend certain vaccines, such as:  Influenza vaccine. This is recommended every year.  Tetanus, diphtheria, and acellular pertussis (Tdap, Td) vaccine. You may need a Td booster every 10 years.  Zoster vaccine. You may need this  after age 71.  Pneumococcal 13-valent conjugate (PCV13) vaccine. You may need this if you have certain conditions and have not been vaccinated.  Pneumococcal polysaccharide (PPSV23) vaccine. You may need one or two doses if you smoke  cigarettes or if you have certain conditions. Talk to your health care provider about which screenings and vaccines you need and how often you need them. This information is not intended to replace advice given to you by your health care provider. Make sure you discuss any questions you have with your health care provider. Document Released: 05/02/2015 Document Revised: 12/24/2015 Document Reviewed: 02/04/2015 Elsevier Interactive Patient Education  2017 [place] Prevention in the Home Falls can cause injuries. They can happen to people of all ages. There are many things you can do to make your home safe and to help prevent falls. What can I do on the outside of my home?  Regularly fix the edges of walkways and driveways and fix any cracks.  Remove anything that might make you trip as you walk through a door, such as a raised step or threshold.  Trim any bushes or trees on the path to your home.  Use bright outdoor lighting.  Clear any walking paths of anything that might make someone trip, such as rocks or tools.  Regularly check to see if handrails are loose or broken. Make sure that both sides of any steps have handrails.  Any raised decks and porches should have guardrails on the edges.  Have any leaves, snow, or ice cleared regularly.  Use sand or salt on walking paths during winter.  Clean up any spills in your garage right away. This includes oil or grease spills. What can I do in the bathroom?  Use night lights.  Install grab bars by the toilet and in the tub and shower. Do not use towel bars as grab bars.  Use non-skid mats or decals in the tub or shower.  If you need to sit down in the shower, use a plastic, non-slip stool.  Keep the floor dry. Clean up any water that spills on the floor as soon as it happens.  Remove soap buildup in the tub or shower regularly.  Attach bath mats securely with double-sided non-slip rug tape.  Do not have throw rugs  and other things on the floor that can make you trip. What can I do in the bedroom?  Use night lights.  Make sure that you have a light by your bed that is easy to reach.  Do not use any sheets or blankets that are too big for your bed. They should not hang down onto the floor.  Have a firm chair that has side arms. You can use this for support while you get dressed.  Do not have throw rugs and other things on the floor that can make you trip. What can I do in the kitchen?  Clean up any spills right away.  Avoid walking on wet floors.  Keep items that you use a lot in easy-to-reach places.  If you need to reach something above you, use a strong step stool that has a grab bar.  Keep electrical cords out of the way.  Do not use floor polish or wax that makes floors slippery. If you must use wax, use non-skid floor wax.  Do not have throw rugs and other things on the floor that can make you trip. What can I do with my stairs?  Do not leave  any items on the stairs.  Make sure that there are handrails on both sides of the stairs and use them. Fix handrails that are broken or loose. Make sure that handrails are as long as the stairways.  Check any carpeting to make sure that it is firmly attached to the stairs. Fix any carpet that is loose or worn.  Avoid having throw rugs at the top or bottom of the stairs. If you do have throw rugs, attach them to the floor with carpet tape.  Make sure that you have a light switch at the top of the stairs and the bottom of the stairs. If you do not have them, ask someone to add them for you. What else can I do to help prevent falls?  Wear shoes that:  Do not have high heels.  Have rubber bottoms.  Are comfortable and fit you well.  Are closed at the toe. Do not wear sandals.  If you use a stepladder:  Make sure that it is fully opened. Do not climb a closed stepladder.  Make sure that both sides of the stepladder are locked into  place.  Ask someone to hold it for you, if possible.  Clearly mark and make sure that you can see:  Any grab bars or handrails.  First and last steps.  Where the edge of each step is.  Use tools that help you move around (mobility aids) if they are needed. These include:  Canes.  Walkers.  Scooters.  Crutches.  Turn on the lights when you go into a dark area. Replace any light bulbs as soon as they burn out.  Set up your furniture so you have a clear path. Avoid moving your furniture around.  If any of your floors are uneven, fix them.  If there are any pets around you, be aware of where they are.  Review your medicines with your doctor. Some medicines can make you feel dizzy. This can increase your chance of falling. Ask your doctor what other things that you can do to help prevent falls. This information is not intended to replace advice given to you by your health care provider. Make sure you discuss any questions you have with your health care provider. Document Released: 01/30/2009 Document Revised: 09/11/2015 Document Reviewed: 05/10/2014 Elsevier Interactive Patient Education  2017 [employer].

## 2017-10-25 ENCOUNTER — Ambulatory Visit: Payer: Medicare Other | Admitting: Unknown Physician Specialty

## 2017-11-24 ENCOUNTER — Other Ambulatory Visit: Payer: Self-pay | Admitting: Unknown Physician Specialty

## 2017-11-24 NOTE — Telephone Encounter (Signed)
Simvastatin and Nexium refill  Last refills on both meds: 04/25/17 # 90; no refills  Last OV:  AWV 07/14/17  ; next office visit 11/25/17  PCP: prev. pt. of Kathrine Haddock

## 2017-11-25 ENCOUNTER — Ambulatory Visit (INDEPENDENT_AMBULATORY_CARE_PROVIDER_SITE_OTHER): Payer: Medicare Other | Admitting: Family Medicine

## 2017-11-25 ENCOUNTER — Other Ambulatory Visit: Payer: Self-pay

## 2017-11-25 ENCOUNTER — Encounter: Payer: Self-pay | Admitting: Family Medicine

## 2017-11-25 VITALS — BP 120/88 | HR 93 | Temp 97.8°F | Ht 71.0 in | Wt 195.0 lb

## 2017-11-25 DIAGNOSIS — F200 Paranoid schizophrenia: Secondary | ICD-10-CM | POA: Diagnosis not present

## 2017-11-25 DIAGNOSIS — K219 Gastro-esophageal reflux disease without esophagitis: Secondary | ICD-10-CM | POA: Diagnosis not present

## 2017-11-25 DIAGNOSIS — E782 Mixed hyperlipidemia: Secondary | ICD-10-CM | POA: Diagnosis not present

## 2017-11-25 DIAGNOSIS — E119 Type 2 diabetes mellitus without complications: Secondary | ICD-10-CM | POA: Diagnosis not present

## 2017-11-25 LAB — MICROALBUMIN, URINE WAIVED
Creatinine, Urine Waived: 300 mg/dL (ref 10–300)
MICROALB, UR WAIVED: 30 mg/L — AB (ref 0–19)
Microalb/Creat Ratio: <30 mg/g (ref <30)

## 2017-11-25 MED ORDER — LORAZEPAM 0.5 MG PO TABS: 0.5000 mg | ORAL_TABLET | Freq: Every day | ORAL | 5 refills | Status: DC

## 2017-11-25 MED ORDER — SIMVASTATIN 10 MG PO TABS: 10.0000 mg | ORAL_TABLET | Freq: Every day | ORAL | 1 refills | Status: DC

## 2017-11-25 MED ORDER — ESOMEPRAZOLE MAGNESIUM 40 MG PO CPDR: 40.0000 mg | DELAYED_RELEASE_CAPSULE | Freq: Every day | ORAL | 1 refills | Status: DC

## 2017-11-25 NOTE — Patient Instructions (Signed)
Follow up in 6 months for a physical exam  Schedule a diabetic eye exam

## 2017-11-25 NOTE — Progress Notes (Signed)
BP 120/88   Pulse 93   Temp 97.8 F (36.6 C) (Oral)   Ht 5\' 11"  (1.803 m)   Wt 195 lb (88.5 kg)   SpO2 93%   BMI 27.20 kg/m    Subjective:    Patient ID: George Glenn, male    DOB: [birth year], 57 y.o.   MRN: [id number]  HPI: George Glenn is a 57 y.o. male  Chief Complaint  Patient presents with  . Medication Refill    zocor, nexium, simvastatin  . Other    A1c check request  . Hyperlipidemia  . Diabetes   Here today for chronic illness management with his sister who helps provide most of the history. No new concerns today.  Not checking home BS. Taking medications compliantly without side effects. Diabetes currently diet controlled. Denies CP, SOB, dizziness, syncope, neuropathy. Has not had eye exam in a few years.   Schizophrenia controlled well off risperdal, currently just on ativan QHS and doing very well. No side effects reported.   Past Medical History:  Diagnosis Date  . Anemia   . Barrett's esophagus   . Brain damage   . Chronic kidney disease   . Diabetes mellitus without complication [facility])    sister states he is no longer diabetic due to diet changes and weight loss  . Dizziness    WHEN STANDING  . GERD (gastroesophageal reflux disease)    Barrett's  . Hyperlipidemia   . Hypertension   . Hypogonadism in male   . Memory loss, short term   . Osteoporosis   . Paranoid schizophrenia ([place])   . Tic    HANDS   Social History   Socioeconomic History  . Marital status: Single    Spouse name: Not on file  . Number of children: Not on file  . Years of education: Not on file  . Highest education level: Not on file  Occupational History  . Not on file  Social Needs  . Financial resource strain: Not hard at all  . Food insecurity:    Worry: Never true    Inability: Never true  . Transportation needs:    Medical: No    Non-medical: No  Tobacco Use  . Smoking status: Never Smoker  . Smokeless tobacco: Never Used  Substance and Sexual  Activity  . Alcohol use: No    Alcohol/week: 0.0 standard drinks  . Drug use: No  . Sexual activity: Never  Lifestyle  . Physical activity:    Days per week: 0 days    Minutes per session: 0 min  . Stress: Not at all  Relationships  . Social connections:    Talks on phone: Never    Gets together: More than three times a week    Attends religious service: Never    Active member of club or organization: No    Attends meetings of clubs or organizations: Never    Relationship status: Never married  . Intimate partner violence:    Fear of current or ex partner: No    Emotionally abused: No    Physically abused: No    Forced sexual activity: No  Other Topics Concern  . Not on file  Social History Narrative  . Not on file   Relevant past medical, surgical, family and social history reviewed and updated as indicated. Interim medical history since our last visit reviewed. Allergies and medications reviewed and updated.  Review of Systems  Per HPI unless specifically indicated above  Objective:    BP 120/88   Pulse 93   Temp 97.8 F (36.6 C) (Oral)   Ht 5\' 11"  (1.803 m)   Wt 195 lb (88.5 kg)   SpO2 93%   BMI 27.20 kg/m   Wt Readings from Last 3 Encounters:  11/25/17 195 lb (88.5 kg)  07/14/17 193 lb 1.6 oz (87.6 kg)  06/08/16 182 lb 3.2 oz (82.6 kg)    Physical Exam  Constitutional: He is oriented to person, place, and time. He appears well-developed and well-nourished.  HENT:  Head: Atraumatic.  Eyes: EOM are normal.  Neck: Normal range of motion. Neck supple.  Pulmonary/Chest: Effort normal and breath sounds normal.  Musculoskeletal: Normal range of motion.  Neurological: He is alert and oriented to person, place, and time.  Skin: Skin is warm and dry.  Psychiatric:  Minimally verbal, but pleasant and cooperative  Nursing note and vitals reviewed.    Diabetic Foot Exam - Simple   Simple Foot Form Visual Inspection No deformities, no ulcerations, no  other skin breakdown bilaterally:  Yes Sensation Testing Intact to touch and monofilament testing bilaterally:  Yes Pulse Check Posterior Tibialis and Dorsalis pulse intact bilaterally:  Yes Comments     Results for orders placed or performed in visit on 11/25/17  Comprehensive metabolic panel  Result Value Ref Range   Glucose 131 (H) 65 - 99 mg/dL   BUN 13 6 - 24 mg/dL   Creatinine, Ser 0.91 0.76 - 1.27 mg/dL   GFR calc non Af Amer 93 >59 mL/min/1.73   GFR calc Af Amer 108 >59 mL/min/1.73   BUN/Creatinine Ratio 14 9 - 20   Sodium 143 134 - 144 mmol/L   Potassium 4.1 3.5 - 5.2 mmol/L   Chloride 105 96 - 106 mmol/L   CO2 20 20 - 29 mmol/L   Calcium 9.2 8.7 - 10.2 mg/dL   Total Protein 6.8 6.0 - 8.5 g/dL   Albumin 4.3 3.5 - 5.5 g/dL   Globulin, Total 2.5 1.5 - 4.5 g/dL   Albumin/Globulin Ratio 1.7 1.2 - 2.2   Bilirubin Total 0.5 0.0 - 1.2 mg/dL   Alkaline Phosphatase 85 39 - 117 IU/L   AST 21 0 - 40 IU/L   ALT 29 0 - 44 IU/L  Lipid Panel w/o Chol/HDL Ratio  Result Value Ref Range   Cholesterol, Total 164 100 - 199 mg/dL   Triglycerides 161 (H) 0 - 149 mg/dL   HDL 23 (L) >39 mg/dL   VLDL Cholesterol Cal 32 5 - 40 mg/dL   LDL Calculated 109 (H) 0 - 99 mg/dL  Microalbumin, Urine Waived  Result Value Ref Range   Microalb, Ur Waived 30 (H) 0 - 19 mg/L   Creatinine, Urine Waived 300 10 - 300 mg/dL   Microalb/Creat Ratio <30 <30 mg/g      Assessment & Plan:   Problem List Items Addressed This Visit      Digestive   GERD (gastroesophageal reflux disease)    With barrett's esophagus, followed by GI. Continue nexium daily      Relevant Medications   esomeprazole (NEXIUM) 40 MG capsule     Endocrine   Type 2 diabetes mellitus without complications ([place]) - Primary    Diet controlled. Recheck A1C today, conitnue lifestyle modifications. Encouraged to schedule annual eye exam      Relevant Medications   simvastatin (ZOCOR) 10 MG tablet   Other Relevant Orders   HgB  A1c   Microalbumin, Urine Waived (  Completed)     Other   Paranoid schizophrenia ([place])    Under good control with nightly ativan. Continue current regimen      Hyperlipidemia    Recheck chol, continue current regimen. Adjust as needed      Relevant Medications   simvastatin (ZOCOR) 10 MG tablet   Other Relevant Orders   Comprehensive metabolic panel (Completed)   Lipid Panel w/o Chol/HDL Ratio (Completed)       Follow up plan: Return in about 6 months (around 05/28/2018) for CPE.

## 2017-11-26 LAB — COMPREHENSIVE METABOLIC PANEL
A/G RATIO: 1.7 (ref 1.2–2.2)
ALBUMIN: 4.3 g/dL (ref 3.5–5.5)
ALT: 29 IU/L (ref 0–44)
AST: 21 IU/L (ref 0–40)
Alkaline Phosphatase: 85 IU/L (ref 39–117)
BUN / CREAT RATIO: 14 (ref 9–20)
BUN: 13 mg/dL (ref 6–24)
Bilirubin Total: 0.5 mg/dL (ref 0.0–1.2)
CALCIUM: 9.2 mg/dL (ref 8.7–10.2)
CO2: 20 mmol/L (ref 20–29)
Chloride: 105 mmol/L (ref 96–106)
Creatinine, Ser: 0.91 mg/dL (ref 0.76–1.27)
GFR, EST AFRICAN AMERICAN: 108 mL/min/{1.73_m2} (ref >59)
GFR, EST NON AFRICAN AMERICAN: 93 mL/min/{1.73_m2} (ref >59)
GLOBULIN, TOTAL: 2.5 g/dL (ref 1.5–4.5)
Glucose: 131 mg/dL — ABNORMAL HIGH (ref 65–99)
POTASSIUM: 4.1 mmol/L (ref 3.5–5.2)
SODIUM: 143 mmol/L (ref 134–144)
TOTAL PROTEIN: 6.8 g/dL (ref 6.0–8.5)

## 2017-11-26 LAB — LIPID PANEL W/O CHOL/HDL RATIO
Cholesterol, Total: 164 mg/dL (ref 100–199)
HDL: 23 mg/dL — AB (ref >39)
LDL Calculated: 109 mg/dL — ABNORMAL HIGH (ref 0–99)
Triglycerides: 161 mg/dL — ABNORMAL HIGH (ref 0–149)
VLDL Cholesterol Cal: 32 mg/dL (ref 5–40)

## 2017-11-28 NOTE — Assessment & Plan Note (Addendum)
Diet controlled. Recheck A1C today, conitnue lifestyle modifications. Encouraged to schedule annual eye exam

## 2017-11-28 NOTE — Assessment & Plan Note (Signed)
Under good control with nightly ativan. Continue current regimen

## 2017-11-28 NOTE — Assessment & Plan Note (Signed)
With barrett's esophagus, followed by GI. Continue nexium daily

## 2017-11-28 NOTE — Assessment & Plan Note (Signed)
Recheck chol, continue current regimen. Adjust as needed

## 2017-12-06 ENCOUNTER — Other Ambulatory Visit: Payer: Medicare Other

## 2017-12-06 DIAGNOSIS — Z114 Encounter for screening for human immunodeficiency virus [HIV]: Secondary | ICD-10-CM | POA: Diagnosis not present

## 2017-12-06 DIAGNOSIS — E119 Type 2 diabetes mellitus without complications: Secondary | ICD-10-CM

## 2017-12-06 LAB — BAYER DCA HB A1C WAIVED: HB A1C: 6 % (ref <7.0)

## 2017-12-07 LAB — HIV ANTIBODY (ROUTINE TESTING W REFLEX): HIV Screen 4th Generation wRfx: NONREACTIVE

## 2018-03-21 ENCOUNTER — Encounter: Payer: Self-pay | Admitting: Family Medicine

## 2018-03-21 ENCOUNTER — Ambulatory Visit (INDEPENDENT_AMBULATORY_CARE_PROVIDER_SITE_OTHER): Payer: Medicare Other | Admitting: Family Medicine

## 2018-03-21 VITALS — BP 127/84 | HR 67 | Temp 97.6°F | Ht 70.5 in | Wt 192.4 lb

## 2018-03-21 DIAGNOSIS — F200 Paranoid schizophrenia: Secondary | ICD-10-CM

## 2018-03-21 DIAGNOSIS — Z23 Encounter for immunization: Secondary | ICD-10-CM | POA: Diagnosis not present

## 2018-03-21 NOTE — Progress Notes (Signed)
   BP 127/84 (BP Location: Left Arm, Patient Position: Sitting, Cuff Size: Normal)   Pulse 67   Temp 97.6 F (36.4 C) (Oral)   Ht 5' 10.5" (1.791 m)   Wt 192 lb 6.4 oz (87.3 kg)   SpO2 95%   BMI 27.22 kg/m    Subjective:    Patient ID: George Glenn, male    DOB: 09/01/1960, 57 y.o.   MRN: [id number]  HPI: George Glenn is a 57 y.o. male  Chief Complaint  Patient presents with  . Medication Refill   Here today for 3 month f/u schizophrenia controlled for years on ativan. Here with his sister who is his primary caregiver. Doing very well, no new concerns today. Denies hallucinations, paranoid or obsessive thoughts, SI/HI.   Unable to perform mental status exam or PHQ-9 due to known brain damage and cognitive impairment.   Relevant past medical, surgical, family and social history reviewed and updated as indicated. Interim medical history since our last visit reviewed. Allergies and medications reviewed and updated.  Review of Systems  Per HPI unless specifically indicated above     Objective:    BP 127/84 (BP Location: Left Arm, Patient Position: Sitting, Cuff Size: Normal)   Pulse 67   Temp 97.6 F (36.4 C) (Oral)   Ht 5' 10.5" (1.791 m)   Wt 192 lb 6.4 oz (87.3 kg)   SpO2 95%   BMI 27.22 kg/m   Wt Readings from Last 3 Encounters:  03/21/18 192 lb 6.4 oz (87.3 kg)  11/25/17 195 lb (88.5 kg)  07/14/17 193 lb 1.6 oz (87.6 kg)    Physical Exam  Constitutional: He appears well-developed and well-nourished. No distress.  HENT:  Head: Atraumatic.  Eyes: Conjunctivae and EOM are normal.  Neck: Normal range of motion. Neck supple.  Cardiovascular: Normal rate.  Pulmonary/Chest: Effort normal and breath sounds normal.  Musculoskeletal: Normal range of motion.  Neurological: He is alert.  Skin: Skin is warm and dry.  Psychiatric: He has a normal mood and affect. His behavior is normal.  Nursing note and vitals reviewed.   Results for orders placed or  performed in visit on 12/06/17  HIV antibody  Result Value Ref Range   HIV Screen 4th Generation wRfx Non Reactive Non Reactive  Bayer DCA Hb A1c Waived  Result Value Ref Range   HB A1C (BAYER DCA - WAIVED) 6.0 <7.0 %      Assessment & Plan:   Problem List Items Addressed This Visit      Other   Paranoid schizophrenia ([place]) - Primary    Stable and under good control with ativan daily at bedtime, continue current regimen       Other Visit Diagnoses    Need for influenza vaccination       Relevant Orders   Flu Vaccine QUAD 6+ mos PF IM (Fluarix Quad PF) (Completed)       Follow up plan: Return in about 3 months (around 06/20/2018) for 6 month f/u.

## 2018-03-21 NOTE — Patient Instructions (Signed)

## 2018-03-24 NOTE — Assessment & Plan Note (Signed)
Stable and under good control with ativan daily at bedtime, continue current regimen

## 2018-04-23 DIAGNOSIS — G939 Disorder of brain, unspecified: Secondary | ICD-10-CM | POA: Diagnosis not present

## 2018-04-24 DIAGNOSIS — G939 Disorder of brain, unspecified: Secondary | ICD-10-CM | POA: Diagnosis not present

## 2018-04-25 DIAGNOSIS — G939 Disorder of brain, unspecified: Secondary | ICD-10-CM | POA: Diagnosis not present

## 2018-04-26 DIAGNOSIS — G939 Disorder of brain, unspecified: Secondary | ICD-10-CM | POA: Diagnosis not present

## 2018-04-27 DIAGNOSIS — G939 Disorder of brain, unspecified: Secondary | ICD-10-CM | POA: Diagnosis not present

## 2018-04-28 DIAGNOSIS — G939 Disorder of brain, unspecified: Secondary | ICD-10-CM | POA: Diagnosis not present

## 2018-04-29 DIAGNOSIS — G939 Disorder of brain, unspecified: Secondary | ICD-10-CM | POA: Diagnosis not present

## 2018-04-30 DIAGNOSIS — G939 Disorder of brain, unspecified: Secondary | ICD-10-CM | POA: Diagnosis not present

## 2018-05-01 DIAGNOSIS — G939 Disorder of brain, unspecified: Secondary | ICD-10-CM | POA: Diagnosis not present

## 2018-05-02 DIAGNOSIS — G939 Disorder of brain, unspecified: Secondary | ICD-10-CM | POA: Diagnosis not present

## 2018-05-03 DIAGNOSIS — G939 Disorder of brain, unspecified: Secondary | ICD-10-CM | POA: Diagnosis not present

## 2018-05-04 DIAGNOSIS — G939 Disorder of brain, unspecified: Secondary | ICD-10-CM | POA: Diagnosis not present

## 2018-05-05 DIAGNOSIS — G939 Disorder of brain, unspecified: Secondary | ICD-10-CM | POA: Diagnosis not present

## 2018-05-06 DIAGNOSIS — G939 Disorder of brain, unspecified: Secondary | ICD-10-CM | POA: Diagnosis not present

## 2018-05-08 DIAGNOSIS — G939 Disorder of brain, unspecified: Secondary | ICD-10-CM | POA: Diagnosis not present

## 2018-05-09 DIAGNOSIS — G939 Disorder of brain, unspecified: Secondary | ICD-10-CM | POA: Diagnosis not present

## 2018-05-10 DIAGNOSIS — G939 Disorder of brain, unspecified: Secondary | ICD-10-CM | POA: Diagnosis not present

## 2018-05-11 DIAGNOSIS — G939 Disorder of brain, unspecified: Secondary | ICD-10-CM | POA: Diagnosis not present

## 2018-05-12 DIAGNOSIS — G939 Disorder of brain, unspecified: Secondary | ICD-10-CM | POA: Diagnosis not present

## 2018-05-13 DIAGNOSIS — G939 Disorder of brain, unspecified: Secondary | ICD-10-CM | POA: Diagnosis not present

## 2018-05-15 DIAGNOSIS — G939 Disorder of brain, unspecified: Secondary | ICD-10-CM | POA: Diagnosis not present

## 2018-05-16 DIAGNOSIS — G939 Disorder of brain, unspecified: Secondary | ICD-10-CM | POA: Diagnosis not present

## 2018-05-17 DIAGNOSIS — G939 Disorder of brain, unspecified: Secondary | ICD-10-CM | POA: Diagnosis not present

## 2018-05-18 DIAGNOSIS — G939 Disorder of brain, unspecified: Secondary | ICD-10-CM | POA: Diagnosis not present

## 2018-05-19 DIAGNOSIS — G939 Disorder of brain, unspecified: Secondary | ICD-10-CM | POA: Diagnosis not present

## 2018-05-20 DIAGNOSIS — G939 Disorder of brain, unspecified: Secondary | ICD-10-CM | POA: Diagnosis not present

## 2018-05-21 DIAGNOSIS — G939 Disorder of brain, unspecified: Secondary | ICD-10-CM | POA: Diagnosis not present

## 2018-05-22 DIAGNOSIS — G939 Disorder of brain, unspecified: Secondary | ICD-10-CM | POA: Diagnosis not present

## 2018-05-23 DIAGNOSIS — G939 Disorder of brain, unspecified: Secondary | ICD-10-CM | POA: Diagnosis not present

## 2018-05-24 DIAGNOSIS — G939 Disorder of brain, unspecified: Secondary | ICD-10-CM | POA: Diagnosis not present

## 2018-05-25 DIAGNOSIS — G939 Disorder of brain, unspecified: Secondary | ICD-10-CM | POA: Diagnosis not present

## 2018-05-26 DIAGNOSIS — G939 Disorder of brain, unspecified: Secondary | ICD-10-CM | POA: Diagnosis not present

## 2018-05-27 DIAGNOSIS — G939 Disorder of brain, unspecified: Secondary | ICD-10-CM | POA: Diagnosis not present

## 2018-05-28 DIAGNOSIS — G939 Disorder of brain, unspecified: Secondary | ICD-10-CM | POA: Diagnosis not present

## 2018-05-29 ENCOUNTER — Other Ambulatory Visit: Payer: Self-pay | Admitting: Family Medicine

## 2018-05-29 DIAGNOSIS — G939 Disorder of brain, unspecified: Secondary | ICD-10-CM | POA: Diagnosis not present

## 2018-05-29 NOTE — Telephone Encounter (Signed)
Rx sent 

## 2018-05-29 NOTE — Telephone Encounter (Signed)
Requested medication (s) are due for refill today: yes  Requested medication (s) are on the active medication list: yes  Last refill:  11/25/17  Future visit scheduled: no  Notes to clinic:  Medication not delegated to NT to refill   Requested Prescriptions  Pending Prescriptions Disp Refills   LORazepam (ATIVAN) 0.5 MG tablet [Pharmacy Med Name: LORAZEPAM 0.5MG  TABLETS] 30 tablet     Sig: TAKE 1 TABLET(0.5 MG) BY MOUTH AT BEDTIME     Not Delegated - Psychiatry:  Anxiolytics/Hypnotics Failed - 05/29/2018 10:25 AM      Failed - This refill cannot be delegated      Failed - Urine Drug Screen completed in last 360 days.      Passed - Valid encounter within last 6 months    Recent Outpatient Visits          2 months ago Paranoid schizophrenia [facility])   [facility] Merrie Roof [place], [place]   6 months ago Type 2 diabetes mellitus without complication, without long-term current use of insulin [facility])   [facility] Merrie Roof [place], [place]   1 year ago Nocturia   [facility] Kathrine Haddock, NP   2 years ago Need for influenza vaccination   [facility] Kathrine Haddock, NP   2 years ago Decreased activities of daily living (ADL)   [facility] Kathrine Haddock, NP

## 2018-05-30 DIAGNOSIS — G939 Disorder of brain, unspecified: Secondary | ICD-10-CM | POA: Diagnosis not present

## 2018-05-31 DIAGNOSIS — G939 Disorder of brain, unspecified: Secondary | ICD-10-CM | POA: Diagnosis not present

## 2018-06-01 DIAGNOSIS — G939 Disorder of brain, unspecified: Secondary | ICD-10-CM | POA: Diagnosis not present

## 2018-06-02 ENCOUNTER — Ambulatory Visit (INDEPENDENT_AMBULATORY_CARE_PROVIDER_SITE_OTHER): Payer: Medicare HMO | Admitting: Family Medicine

## 2018-06-02 ENCOUNTER — Encounter: Payer: Self-pay | Admitting: Family Medicine

## 2018-06-02 VITALS — BP 132/84 | HR 86 | Temp 97.6°F | Wt 194.5 lb

## 2018-06-02 DIAGNOSIS — E119 Type 2 diabetes mellitus without complications: Secondary | ICD-10-CM

## 2018-06-02 DIAGNOSIS — F200 Paranoid schizophrenia: Secondary | ICD-10-CM

## 2018-06-02 DIAGNOSIS — K2270 Barrett's esophagus without dysplasia: Secondary | ICD-10-CM | POA: Diagnosis not present

## 2018-06-02 DIAGNOSIS — E782 Mixed hyperlipidemia: Secondary | ICD-10-CM

## 2018-06-02 DIAGNOSIS — G939 Disorder of brain, unspecified: Secondary | ICD-10-CM | POA: Diagnosis not present

## 2018-06-02 DIAGNOSIS — R69 Illness, unspecified: Secondary | ICD-10-CM | POA: Diagnosis not present

## 2018-06-02 MED ORDER — ESOMEPRAZOLE MAGNESIUM 40 MG PO CPDR: 40.0000 mg | DELAYED_RELEASE_CAPSULE | Freq: Every day | ORAL | 1 refills | Status: DC

## 2018-06-02 MED ORDER — SIMVASTATIN 10 MG PO TABS: 10.0000 mg | ORAL_TABLET | Freq: Every day | ORAL | 1 refills | Status: DC

## 2018-06-02 NOTE — Progress Notes (Signed)
BP 132/84   Pulse 86   Temp 97.6 F (36.4 C) (Oral)   Wt 194 lb 8 oz (88.2 kg)   SpO2 95%   BMI 27.51 kg/m    Subjective:    Patient ID: George Glenn, male    DOB: 1960/05/15, 58 y.o.   MRN: [id number]  HPI: George Glenn is a 58 y.o. male  Chief Complaint  Patient presents with  . Follow-up    medication follow up   Here today for 6 month f/u. Presents with his sister, who is his primary caregiver.   Taking ativan nightly for anxiety and schizophrenia, which seems to keep his agitation down and help with sleep. Sister states for the most part he does well. Can become difficult at times but no hallucinations, aggression, severe mood swings.   Has not been taking his nexium, sister states he has been throwing them in the garbage. She is concerned about this as he was diagnosed several years ago with Barrett's esophagus. Has not followed up with GI since dx. She states he has not complained of reflux sxs.   DM is diet controlled, eats a healthy diet. Does not exercise regularly. Does not check sugars at home. No low blood sugar spells.   Taking simvastatin for cholesterol management without side effects, reported CP, SOB.   Relevant past medical, surgical, family and social history reviewed and updated as indicated. Interim medical history since our last visit reviewed. Allergies and medications reviewed and updated.  Review of Systems  Per HPI unless specifically indicated above     Objective:    BP 132/84   Pulse 86   Temp 97.6 F (36.4 C) (Oral)   Wt 194 lb 8 oz (88.2 kg)   SpO2 95%   BMI 27.51 kg/m   Wt Readings from Last 3 Encounters:  06/02/18 194 lb 8 oz (88.2 kg)  03/21/18 192 lb 6.4 oz (87.3 kg)  11/25/17 195 lb (88.5 kg)    Physical Exam Vitals signs and nursing note reviewed.  Constitutional:      Appearance: Normal appearance.  HENT:     Head: Atraumatic.  Eyes:     Extraocular Movements: Extraocular movements intact.   Conjunctiva/sclera: Conjunctivae normal.  Neck:     Musculoskeletal: Normal range of motion and neck supple.  Cardiovascular:     Rate and Rhythm: Normal rate and regular rhythm.  Pulmonary:     Effort: Pulmonary effort is normal.     Breath sounds: Normal breath sounds.  Musculoskeletal: Normal range of motion.  Skin:    General: Skin is warm and dry.  Neurological:     Mental Status: Mental status is at baseline.  Psychiatric:        Mood and Affect: Mood normal.        Behavior: Behavior normal.     Results for orders placed or performed in visit on 06/02/18  Lipid Panel w/o Chol/HDL Ratio  Result Value Ref Range   Cholesterol, Total 138 100 - 199 mg/dL   Triglycerides 218 (H) 0 - 149 mg/dL   HDL 25 (L) >39 mg/dL   VLDL Cholesterol Cal 44 (H) 5 - 40 mg/dL   LDL Calculated 69 0 - 99 mg/dL  Comprehensive metabolic panel  Result Value Ref Range   Glucose 109 (H) 65 - 99 mg/dL   BUN 15 6 - 24 mg/dL   Creatinine, Ser 0.82 0.76 - 1.27 mg/dL   GFR calc non Af Amer 97 >59 mL/min/1.73  GFR calc Af Amer 113 >59 mL/min/1.73   BUN/Creatinine Ratio 18 9 - 20   Sodium 138 134 - 144 mmol/L   Potassium 3.9 3.5 - 5.2 mmol/L   Chloride 98 96 - 106 mmol/L   CO2 24 20 - 29 mmol/L   Calcium 9.1 8.7 - 10.2 mg/dL   Total Protein 7.1 6.0 - 8.5 g/dL   Albumin 4.4 3.8 - 4.9 g/dL   Globulin, Total 2.7 1.5 - 4.5 g/dL   Albumin/Globulin Ratio 1.6 1.2 - 2.2   Bilirubin Total 0.5 0.0 - 1.2 mg/dL   Alkaline Phosphatase 100 39 - 117 IU/L   AST 26 0 - 40 IU/L   ALT 37 0 - 44 IU/L  HgB A1c  Result Value Ref Range   Hgb A1c MFr Bld 6.0 (H) 4.8 - 5.6 %   Est. average glucose Bld gHb Est-mCnc 126 mg/dL      Assessment & Plan:   Problem List Items Addressed This Visit      Digestive   Barrett's esophagus    Continue PPI, sister will keep trying to monitor him while he's taking it. F/u with GI as he's overdue        Endocrine   Type 2 diabetes mellitus without complications ([place])     Recheck A1C, adjust as needed      Relevant Medications   simvastatin (ZOCOR) 10 MG tablet   Other Relevant Orders   HgB A1c (Completed)     Other   Paranoid schizophrenia ([place])    Has been stable for years on ativan nightly. Continue current regimen      Hyperlipidemia - Primary    Recheck lipids, adjust as needed. Continue current regimen and good lifestyle modifications      Relevant Medications   simvastatin (ZOCOR) 10 MG tablet   Other Relevant Orders   Lipid Panel w/o Chol/HDL Ratio (Completed)   Comprehensive metabolic panel (Completed)       Follow up plan: Return in about 6 months (around 12/01/2018) for CPE.

## 2018-06-03 DIAGNOSIS — G939 Disorder of brain, unspecified: Secondary | ICD-10-CM | POA: Diagnosis not present

## 2018-06-03 LAB — COMPREHENSIVE METABOLIC PANEL
ALT: 37 IU/L (ref 0–44)
AST: 26 IU/L (ref 0–40)
Albumin/Globulin Ratio: 1.6 (ref 1.2–2.2)
Albumin: 4.4 g/dL (ref 3.8–4.9)
Alkaline Phosphatase: 100 IU/L (ref 39–117)
BILIRUBIN TOTAL: 0.5 mg/dL (ref 0.0–1.2)
BUN/Creatinine Ratio: 18 (ref 9–20)
BUN: 15 mg/dL (ref 6–24)
CHLORIDE: 98 mmol/L (ref 96–106)
CO2: 24 mmol/L (ref 20–29)
Calcium: 9.1 mg/dL (ref 8.7–10.2)
Creatinine, Ser: 0.82 mg/dL (ref 0.76–1.27)
GFR calc Af Amer: 113 mL/min/{1.73_m2} (ref >59)
GFR calc non Af Amer: 97 mL/min/{1.73_m2} (ref >59)
Globulin, Total: 2.7 g/dL (ref 1.5–4.5)
Glucose: 109 mg/dL — ABNORMAL HIGH (ref 65–99)
Potassium: 3.9 mmol/L (ref 3.5–5.2)
Sodium: 138 mmol/L (ref 134–144)
Total Protein: 7.1 g/dL (ref 6.0–8.5)

## 2018-06-03 LAB — HEMOGLOBIN A1C
Est. average glucose Bld gHb Est-mCnc: 126 mg/dL
Hgb A1c MFr Bld: 6 % — ABNORMAL HIGH (ref 4.8–5.6)

## 2018-06-03 LAB — LIPID PANEL W/O CHOL/HDL RATIO
Cholesterol, Total: 138 mg/dL (ref 100–199)
HDL: 25 mg/dL — ABNORMAL LOW (ref >39)
LDL Calculated: 69 mg/dL (ref 0–99)
Triglycerides: 218 mg/dL — ABNORMAL HIGH (ref 0–149)
VLDL Cholesterol Cal: 44 mg/dL — ABNORMAL HIGH (ref 5–40)

## 2018-06-04 DIAGNOSIS — G939 Disorder of brain, unspecified: Secondary | ICD-10-CM | POA: Diagnosis not present

## 2018-06-05 ENCOUNTER — Encounter: Payer: Self-pay | Admitting: Family Medicine

## 2018-06-05 DIAGNOSIS — G939 Disorder of brain, unspecified: Secondary | ICD-10-CM | POA: Diagnosis not present

## 2018-06-06 DIAGNOSIS — G939 Disorder of brain, unspecified: Secondary | ICD-10-CM | POA: Diagnosis not present

## 2018-06-07 DIAGNOSIS — G939 Disorder of brain, unspecified: Secondary | ICD-10-CM | POA: Diagnosis not present

## 2018-06-07 NOTE — Assessment & Plan Note (Signed)
Recheck lipids, adjust as needed. Continue current regimen and good lifestyle modifications 

## 2018-06-07 NOTE — Assessment & Plan Note (Signed)
Has been stable for years on ativan nightly. Continue current regimen

## 2018-06-07 NOTE — Assessment & Plan Note (Signed)
Continue PPI, sister will keep trying to monitor him while he's taking it. F/u with GI as he's overdue

## 2018-06-07 NOTE — Assessment & Plan Note (Signed)
Recheck A1C, adjust as needed

## 2018-06-08 DIAGNOSIS — G939 Disorder of brain, unspecified: Secondary | ICD-10-CM | POA: Diagnosis not present

## 2018-06-09 DIAGNOSIS — G939 Disorder of brain, unspecified: Secondary | ICD-10-CM | POA: Diagnosis not present

## 2018-06-10 DIAGNOSIS — G939 Disorder of brain, unspecified: Secondary | ICD-10-CM | POA: Diagnosis not present

## 2018-06-11 DIAGNOSIS — G939 Disorder of brain, unspecified: Secondary | ICD-10-CM | POA: Diagnosis not present

## 2018-06-12 DIAGNOSIS — G939 Disorder of brain, unspecified: Secondary | ICD-10-CM | POA: Diagnosis not present

## 2018-06-13 DIAGNOSIS — G939 Disorder of brain, unspecified: Secondary | ICD-10-CM | POA: Diagnosis not present

## 2018-06-14 DIAGNOSIS — G939 Disorder of brain, unspecified: Secondary | ICD-10-CM | POA: Diagnosis not present

## 2018-06-15 DIAGNOSIS — G939 Disorder of brain, unspecified: Secondary | ICD-10-CM | POA: Diagnosis not present

## 2018-06-16 DIAGNOSIS — G939 Disorder of brain, unspecified: Secondary | ICD-10-CM | POA: Diagnosis not present

## 2018-06-17 DIAGNOSIS — G939 Disorder of brain, unspecified: Secondary | ICD-10-CM | POA: Diagnosis not present

## 2018-06-18 DIAGNOSIS — G939 Disorder of brain, unspecified: Secondary | ICD-10-CM | POA: Diagnosis not present

## 2018-06-19 DIAGNOSIS — G939 Disorder of brain, unspecified: Secondary | ICD-10-CM | POA: Diagnosis not present

## 2018-06-20 DIAGNOSIS — G939 Disorder of brain, unspecified: Secondary | ICD-10-CM | POA: Diagnosis not present

## 2018-06-21 DIAGNOSIS — G939 Disorder of brain, unspecified: Secondary | ICD-10-CM | POA: Diagnosis not present

## 2018-06-22 DIAGNOSIS — G939 Disorder of brain, unspecified: Secondary | ICD-10-CM | POA: Diagnosis not present

## 2018-06-23 DIAGNOSIS — G939 Disorder of brain, unspecified: Secondary | ICD-10-CM | POA: Diagnosis not present

## 2018-06-24 DIAGNOSIS — G939 Disorder of brain, unspecified: Secondary | ICD-10-CM | POA: Diagnosis not present

## 2018-06-25 DIAGNOSIS — G939 Disorder of brain, unspecified: Secondary | ICD-10-CM | POA: Diagnosis not present

## 2018-06-26 DIAGNOSIS — G939 Disorder of brain, unspecified: Secondary | ICD-10-CM | POA: Diagnosis not present

## 2018-06-27 DIAGNOSIS — G939 Disorder of brain, unspecified: Secondary | ICD-10-CM | POA: Diagnosis not present

## 2018-06-28 DIAGNOSIS — G939 Disorder of brain, unspecified: Secondary | ICD-10-CM | POA: Diagnosis not present

## 2018-06-29 DIAGNOSIS — G939 Disorder of brain, unspecified: Secondary | ICD-10-CM | POA: Diagnosis not present

## 2018-06-30 DIAGNOSIS — G939 Disorder of brain, unspecified: Secondary | ICD-10-CM | POA: Diagnosis not present

## 2018-07-01 DIAGNOSIS — G939 Disorder of brain, unspecified: Secondary | ICD-10-CM | POA: Diagnosis not present

## 2018-07-02 DIAGNOSIS — G939 Disorder of brain, unspecified: Secondary | ICD-10-CM | POA: Diagnosis not present

## 2018-07-03 DIAGNOSIS — G939 Disorder of brain, unspecified: Secondary | ICD-10-CM | POA: Diagnosis not present

## 2018-07-04 DIAGNOSIS — G939 Disorder of brain, unspecified: Secondary | ICD-10-CM | POA: Diagnosis not present

## 2018-07-05 DIAGNOSIS — G939 Disorder of brain, unspecified: Secondary | ICD-10-CM | POA: Diagnosis not present

## 2018-07-06 DIAGNOSIS — G939 Disorder of brain, unspecified: Secondary | ICD-10-CM | POA: Diagnosis not present

## 2018-07-07 DIAGNOSIS — G939 Disorder of brain, unspecified: Secondary | ICD-10-CM | POA: Diagnosis not present

## 2018-07-08 DIAGNOSIS — G939 Disorder of brain, unspecified: Secondary | ICD-10-CM | POA: Diagnosis not present

## 2018-07-09 DIAGNOSIS — G939 Disorder of brain, unspecified: Secondary | ICD-10-CM | POA: Diagnosis not present

## 2018-07-10 DIAGNOSIS — G939 Disorder of brain, unspecified: Secondary | ICD-10-CM | POA: Diagnosis not present

## 2018-07-11 DIAGNOSIS — G939 Disorder of brain, unspecified: Secondary | ICD-10-CM | POA: Diagnosis not present

## 2018-07-12 DIAGNOSIS — G939 Disorder of brain, unspecified: Secondary | ICD-10-CM | POA: Diagnosis not present

## 2018-07-13 DIAGNOSIS — G939 Disorder of brain, unspecified: Secondary | ICD-10-CM | POA: Diagnosis not present

## 2018-07-14 DIAGNOSIS — G939 Disorder of brain, unspecified: Secondary | ICD-10-CM | POA: Diagnosis not present

## 2018-07-15 DIAGNOSIS — G939 Disorder of brain, unspecified: Secondary | ICD-10-CM | POA: Diagnosis not present

## 2018-07-17 DIAGNOSIS — G939 Disorder of brain, unspecified: Secondary | ICD-10-CM | POA: Diagnosis not present

## 2018-07-18 DIAGNOSIS — G939 Disorder of brain, unspecified: Secondary | ICD-10-CM | POA: Diagnosis not present

## 2018-07-19 DIAGNOSIS — G939 Disorder of brain, unspecified: Secondary | ICD-10-CM | POA: Diagnosis not present

## 2018-07-20 DIAGNOSIS — G939 Disorder of brain, unspecified: Secondary | ICD-10-CM | POA: Diagnosis not present

## 2018-07-21 DIAGNOSIS — G939 Disorder of brain, unspecified: Secondary | ICD-10-CM | POA: Diagnosis not present

## 2018-07-22 DIAGNOSIS — G939 Disorder of brain, unspecified: Secondary | ICD-10-CM | POA: Diagnosis not present

## 2018-07-23 DIAGNOSIS — G939 Disorder of brain, unspecified: Secondary | ICD-10-CM | POA: Diagnosis not present

## 2018-07-24 DIAGNOSIS — G939 Disorder of brain, unspecified: Secondary | ICD-10-CM | POA: Diagnosis not present

## 2018-07-25 DIAGNOSIS — G939 Disorder of brain, unspecified: Secondary | ICD-10-CM | POA: Diagnosis not present

## 2018-07-26 DIAGNOSIS — G939 Disorder of brain, unspecified: Secondary | ICD-10-CM | POA: Diagnosis not present

## 2018-07-27 DIAGNOSIS — G939 Disorder of brain, unspecified: Secondary | ICD-10-CM | POA: Diagnosis not present

## 2018-07-28 DIAGNOSIS — G939 Disorder of brain, unspecified: Secondary | ICD-10-CM | POA: Diagnosis not present

## 2018-07-29 DIAGNOSIS — G939 Disorder of brain, unspecified: Secondary | ICD-10-CM | POA: Diagnosis not present

## 2018-07-30 DIAGNOSIS — G939 Disorder of brain, unspecified: Secondary | ICD-10-CM | POA: Diagnosis not present

## 2018-07-31 DIAGNOSIS — G939 Disorder of brain, unspecified: Secondary | ICD-10-CM | POA: Diagnosis not present

## 2018-08-01 DIAGNOSIS — G939 Disorder of brain, unspecified: Secondary | ICD-10-CM | POA: Diagnosis not present

## 2018-08-02 DIAGNOSIS — G939 Disorder of brain, unspecified: Secondary | ICD-10-CM | POA: Diagnosis not present

## 2018-08-03 DIAGNOSIS — G939 Disorder of brain, unspecified: Secondary | ICD-10-CM | POA: Diagnosis not present

## 2018-08-04 DIAGNOSIS — G939 Disorder of brain, unspecified: Secondary | ICD-10-CM | POA: Diagnosis not present

## 2018-08-05 DIAGNOSIS — G939 Disorder of brain, unspecified: Secondary | ICD-10-CM | POA: Diagnosis not present

## 2018-08-06 DIAGNOSIS — G939 Disorder of brain, unspecified: Secondary | ICD-10-CM | POA: Diagnosis not present

## 2018-08-07 DIAGNOSIS — G939 Disorder of brain, unspecified: Secondary | ICD-10-CM | POA: Diagnosis not present

## 2018-08-08 DIAGNOSIS — G939 Disorder of brain, unspecified: Secondary | ICD-10-CM | POA: Diagnosis not present

## 2018-08-09 DIAGNOSIS — G939 Disorder of brain, unspecified: Secondary | ICD-10-CM | POA: Diagnosis not present

## 2018-08-10 DIAGNOSIS — G939 Disorder of brain, unspecified: Secondary | ICD-10-CM | POA: Diagnosis not present

## 2018-08-11 DIAGNOSIS — G939 Disorder of brain, unspecified: Secondary | ICD-10-CM | POA: Diagnosis not present

## 2018-08-12 DIAGNOSIS — G939 Disorder of brain, unspecified: Secondary | ICD-10-CM | POA: Diagnosis not present

## 2018-08-13 DIAGNOSIS — G939 Disorder of brain, unspecified: Secondary | ICD-10-CM | POA: Diagnosis not present

## 2018-08-14 DIAGNOSIS — G939 Disorder of brain, unspecified: Secondary | ICD-10-CM | POA: Diagnosis not present

## 2018-08-15 DIAGNOSIS — G939 Disorder of brain, unspecified: Secondary | ICD-10-CM | POA: Diagnosis not present

## 2018-08-16 DIAGNOSIS — G939 Disorder of brain, unspecified: Secondary | ICD-10-CM | POA: Diagnosis not present

## 2018-08-17 DIAGNOSIS — G939 Disorder of brain, unspecified: Secondary | ICD-10-CM | POA: Diagnosis not present

## 2018-08-18 DIAGNOSIS — G939 Disorder of brain, unspecified: Secondary | ICD-10-CM | POA: Diagnosis not present

## 2018-08-19 DIAGNOSIS — G939 Disorder of brain, unspecified: Secondary | ICD-10-CM | POA: Diagnosis not present

## 2018-08-20 DIAGNOSIS — G939 Disorder of brain, unspecified: Secondary | ICD-10-CM | POA: Diagnosis not present

## 2018-08-21 DIAGNOSIS — G939 Disorder of brain, unspecified: Secondary | ICD-10-CM | POA: Diagnosis not present

## 2018-08-22 DIAGNOSIS — G939 Disorder of brain, unspecified: Secondary | ICD-10-CM | POA: Diagnosis not present

## 2018-08-23 DIAGNOSIS — G939 Disorder of brain, unspecified: Secondary | ICD-10-CM | POA: Diagnosis not present

## 2018-08-24 DIAGNOSIS — G939 Disorder of brain, unspecified: Secondary | ICD-10-CM | POA: Diagnosis not present

## 2018-08-25 DIAGNOSIS — G939 Disorder of brain, unspecified: Secondary | ICD-10-CM | POA: Diagnosis not present

## 2018-08-26 DIAGNOSIS — G939 Disorder of brain, unspecified: Secondary | ICD-10-CM | POA: Diagnosis not present

## 2018-08-27 DIAGNOSIS — G939 Disorder of brain, unspecified: Secondary | ICD-10-CM | POA: Diagnosis not present

## 2018-08-28 DIAGNOSIS — G939 Disorder of brain, unspecified: Secondary | ICD-10-CM | POA: Diagnosis not present

## 2018-08-29 DIAGNOSIS — G939 Disorder of brain, unspecified: Secondary | ICD-10-CM | POA: Diagnosis not present

## 2018-08-30 DIAGNOSIS — G939 Disorder of brain, unspecified: Secondary | ICD-10-CM | POA: Diagnosis not present

## 2018-08-31 DIAGNOSIS — G939 Disorder of brain, unspecified: Secondary | ICD-10-CM | POA: Diagnosis not present

## 2018-09-01 DIAGNOSIS — G939 Disorder of brain, unspecified: Secondary | ICD-10-CM | POA: Diagnosis not present

## 2018-09-02 DIAGNOSIS — G939 Disorder of brain, unspecified: Secondary | ICD-10-CM | POA: Diagnosis not present

## 2018-09-03 DIAGNOSIS — G939 Disorder of brain, unspecified: Secondary | ICD-10-CM | POA: Diagnosis not present

## 2018-09-04 DIAGNOSIS — G939 Disorder of brain, unspecified: Secondary | ICD-10-CM | POA: Diagnosis not present

## 2018-09-05 DIAGNOSIS — G939 Disorder of brain, unspecified: Secondary | ICD-10-CM | POA: Diagnosis not present

## 2018-09-06 DIAGNOSIS — G939 Disorder of brain, unspecified: Secondary | ICD-10-CM | POA: Diagnosis not present

## 2018-09-07 DIAGNOSIS — G939 Disorder of brain, unspecified: Secondary | ICD-10-CM | POA: Diagnosis not present

## 2018-09-08 DIAGNOSIS — G939 Disorder of brain, unspecified: Secondary | ICD-10-CM | POA: Diagnosis not present

## 2018-09-09 DIAGNOSIS — G939 Disorder of brain, unspecified: Secondary | ICD-10-CM | POA: Diagnosis not present

## 2018-09-10 DIAGNOSIS — G939 Disorder of brain, unspecified: Secondary | ICD-10-CM | POA: Diagnosis not present

## 2018-09-11 DIAGNOSIS — G939 Disorder of brain, unspecified: Secondary | ICD-10-CM | POA: Diagnosis not present

## 2018-09-12 DIAGNOSIS — G939 Disorder of brain, unspecified: Secondary | ICD-10-CM | POA: Diagnosis not present

## 2018-09-13 DIAGNOSIS — G939 Disorder of brain, unspecified: Secondary | ICD-10-CM | POA: Diagnosis not present

## 2018-09-14 DIAGNOSIS — G939 Disorder of brain, unspecified: Secondary | ICD-10-CM | POA: Diagnosis not present

## 2018-09-15 DIAGNOSIS — G939 Disorder of brain, unspecified: Secondary | ICD-10-CM | POA: Diagnosis not present

## 2018-09-16 DIAGNOSIS — G939 Disorder of brain, unspecified: Secondary | ICD-10-CM | POA: Diagnosis not present

## 2018-09-17 DIAGNOSIS — G939 Disorder of brain, unspecified: Secondary | ICD-10-CM | POA: Diagnosis not present

## 2018-09-18 DIAGNOSIS — G939 Disorder of brain, unspecified: Secondary | ICD-10-CM | POA: Diagnosis not present

## 2018-09-19 DIAGNOSIS — G939 Disorder of brain, unspecified: Secondary | ICD-10-CM | POA: Diagnosis not present

## 2018-09-20 DIAGNOSIS — G939 Disorder of brain, unspecified: Secondary | ICD-10-CM | POA: Diagnosis not present

## 2018-09-21 DIAGNOSIS — G939 Disorder of brain, unspecified: Secondary | ICD-10-CM | POA: Diagnosis not present

## 2018-09-22 DIAGNOSIS — G939 Disorder of brain, unspecified: Secondary | ICD-10-CM | POA: Diagnosis not present

## 2018-09-23 DIAGNOSIS — G939 Disorder of brain, unspecified: Secondary | ICD-10-CM | POA: Diagnosis not present

## 2018-09-24 DIAGNOSIS — G939 Disorder of brain, unspecified: Secondary | ICD-10-CM | POA: Diagnosis not present

## 2018-09-25 DIAGNOSIS — G939 Disorder of brain, unspecified: Secondary | ICD-10-CM | POA: Diagnosis not present

## 2018-09-26 DIAGNOSIS — G939 Disorder of brain, unspecified: Secondary | ICD-10-CM | POA: Diagnosis not present

## 2018-09-27 DIAGNOSIS — G939 Disorder of brain, unspecified: Secondary | ICD-10-CM | POA: Diagnosis not present

## 2018-09-28 DIAGNOSIS — G939 Disorder of brain, unspecified: Secondary | ICD-10-CM | POA: Diagnosis not present

## 2018-09-29 DIAGNOSIS — G939 Disorder of brain, unspecified: Secondary | ICD-10-CM | POA: Diagnosis not present

## 2018-09-30 DIAGNOSIS — G939 Disorder of brain, unspecified: Secondary | ICD-10-CM | POA: Diagnosis not present

## 2018-10-01 DIAGNOSIS — G939 Disorder of brain, unspecified: Secondary | ICD-10-CM | POA: Diagnosis not present

## 2018-10-02 DIAGNOSIS — G939 Disorder of brain, unspecified: Secondary | ICD-10-CM | POA: Diagnosis not present

## 2018-10-03 DIAGNOSIS — G939 Disorder of brain, unspecified: Secondary | ICD-10-CM | POA: Diagnosis not present

## 2018-10-04 DIAGNOSIS — G939 Disorder of brain, unspecified: Secondary | ICD-10-CM | POA: Diagnosis not present

## 2018-10-05 DIAGNOSIS — G939 Disorder of brain, unspecified: Secondary | ICD-10-CM | POA: Diagnosis not present

## 2018-10-06 DIAGNOSIS — G939 Disorder of brain, unspecified: Secondary | ICD-10-CM | POA: Diagnosis not present

## 2018-10-07 DIAGNOSIS — G939 Disorder of brain, unspecified: Secondary | ICD-10-CM | POA: Diagnosis not present

## 2018-10-08 DIAGNOSIS — G939 Disorder of brain, unspecified: Secondary | ICD-10-CM | POA: Diagnosis not present

## 2018-10-09 DIAGNOSIS — G939 Disorder of brain, unspecified: Secondary | ICD-10-CM | POA: Diagnosis not present

## 2018-10-10 DIAGNOSIS — G939 Disorder of brain, unspecified: Secondary | ICD-10-CM | POA: Diagnosis not present

## 2018-10-11 DIAGNOSIS — G939 Disorder of brain, unspecified: Secondary | ICD-10-CM | POA: Diagnosis not present

## 2018-10-12 ENCOUNTER — Other Ambulatory Visit: Payer: Self-pay | Admitting: Family Medicine

## 2018-10-12 DIAGNOSIS — G939 Disorder of brain, unspecified: Secondary | ICD-10-CM | POA: Diagnosis not present

## 2018-10-12 NOTE — Telephone Encounter (Signed)
Forwarding medication refill to PCP for review. 

## 2018-10-13 DIAGNOSIS — G939 Disorder of brain, unspecified: Secondary | ICD-10-CM | POA: Diagnosis not present

## 2018-10-14 DIAGNOSIS — G939 Disorder of brain, unspecified: Secondary | ICD-10-CM | POA: Diagnosis not present

## 2018-10-15 DIAGNOSIS — G939 Disorder of brain, unspecified: Secondary | ICD-10-CM | POA: Diagnosis not present

## 2018-10-16 DIAGNOSIS — G939 Disorder of brain, unspecified: Secondary | ICD-10-CM | POA: Diagnosis not present

## 2018-10-17 DIAGNOSIS — G939 Disorder of brain, unspecified: Secondary | ICD-10-CM | POA: Diagnosis not present

## 2018-10-18 DIAGNOSIS — G939 Disorder of brain, unspecified: Secondary | ICD-10-CM | POA: Diagnosis not present

## 2018-10-19 DIAGNOSIS — G939 Disorder of brain, unspecified: Secondary | ICD-10-CM | POA: Diagnosis not present

## 2018-10-20 DIAGNOSIS — G939 Disorder of brain, unspecified: Secondary | ICD-10-CM | POA: Diagnosis not present

## 2018-10-21 DIAGNOSIS — G939 Disorder of brain, unspecified: Secondary | ICD-10-CM | POA: Diagnosis not present

## 2018-10-22 DIAGNOSIS — G939 Disorder of brain, unspecified: Secondary | ICD-10-CM | POA: Diagnosis not present

## 2018-10-23 DIAGNOSIS — G939 Disorder of brain, unspecified: Secondary | ICD-10-CM | POA: Diagnosis not present

## 2018-10-24 DIAGNOSIS — G939 Disorder of brain, unspecified: Secondary | ICD-10-CM | POA: Diagnosis not present

## 2018-10-25 DIAGNOSIS — G939 Disorder of brain, unspecified: Secondary | ICD-10-CM | POA: Diagnosis not present

## 2018-10-26 DIAGNOSIS — G939 Disorder of brain, unspecified: Secondary | ICD-10-CM | POA: Diagnosis not present

## 2018-10-27 DIAGNOSIS — G939 Disorder of brain, unspecified: Secondary | ICD-10-CM | POA: Diagnosis not present

## 2018-10-28 DIAGNOSIS — G939 Disorder of brain, unspecified: Secondary | ICD-10-CM | POA: Diagnosis not present

## 2018-10-29 DIAGNOSIS — G939 Disorder of brain, unspecified: Secondary | ICD-10-CM | POA: Diagnosis not present

## 2018-10-30 DIAGNOSIS — G939 Disorder of brain, unspecified: Secondary | ICD-10-CM | POA: Diagnosis not present

## 2018-10-31 DIAGNOSIS — G939 Disorder of brain, unspecified: Secondary | ICD-10-CM | POA: Diagnosis not present

## 2018-11-01 DIAGNOSIS — G939 Disorder of brain, unspecified: Secondary | ICD-10-CM | POA: Diagnosis not present

## 2018-11-02 DIAGNOSIS — G939 Disorder of brain, unspecified: Secondary | ICD-10-CM | POA: Diagnosis not present

## 2018-11-03 DIAGNOSIS — G939 Disorder of brain, unspecified: Secondary | ICD-10-CM | POA: Diagnosis not present

## 2018-11-04 DIAGNOSIS — G939 Disorder of brain, unspecified: Secondary | ICD-10-CM | POA: Diagnosis not present

## 2018-11-05 DIAGNOSIS — G939 Disorder of brain, unspecified: Secondary | ICD-10-CM | POA: Diagnosis not present

## 2018-11-06 ENCOUNTER — Other Ambulatory Visit: Payer: Self-pay | Admitting: Family Medicine

## 2018-11-06 DIAGNOSIS — G939 Disorder of brain, unspecified: Secondary | ICD-10-CM | POA: Diagnosis not present

## 2018-11-07 DIAGNOSIS — G939 Disorder of brain, unspecified: Secondary | ICD-10-CM | POA: Diagnosis not present

## 2018-11-07 NOTE — Telephone Encounter (Signed)
Requested medication (s) are due for refill today: yes  Requested medication (s) are on the active medication list: yes  Last refill:  10/13/2018  Future visit scheduled: no  Notes to clinic: not deletated    Requested Prescriptions  Pending Prescriptions Disp Refills   LORazepam (ATIVAN) 0.5 MG tablet [Pharmacy Med Name: LORAZEPAM 0.5MG  TABLETS] 30 tablet     Sig: TAKE 1 TABLET(0.5 MG) BY MOUTH AT BEDTIME     Not Delegated - Psychiatry:  Anxiolytics/Hypnotics Failed - 11/06/2018  8:37 PM      Failed - This refill cannot be delegated      Failed - Urine Drug Screen completed in last 360 days.      Passed - Valid encounter within last 6 months    Recent Outpatient Visits          5 months ago Mixed hyperlipidemia   [facility] Merrie Roof [place], [place]   7 months ago Paranoid schizophrenia [facility])   [facility] Merrie Roof [place], [place]   11 months ago Type 2 diabetes mellitus without complication, without long-term current use of insulin [facility])   [facility] Merrie Roof [place], [place]   2 years ago Nocturia   [facility] Kathrine Haddock, NP   2 years ago Need for influenza vaccination   [facility] Kathrine Haddock, NP

## 2018-11-08 DIAGNOSIS — G939 Disorder of brain, unspecified: Secondary | ICD-10-CM | POA: Diagnosis not present

## 2018-11-09 DIAGNOSIS — G939 Disorder of brain, unspecified: Secondary | ICD-10-CM | POA: Diagnosis not present

## 2018-11-10 DIAGNOSIS — G939 Disorder of brain, unspecified: Secondary | ICD-10-CM | POA: Diagnosis not present

## 2018-11-11 DIAGNOSIS — G939 Disorder of brain, unspecified: Secondary | ICD-10-CM | POA: Diagnosis not present

## 2018-11-12 DIAGNOSIS — G939 Disorder of brain, unspecified: Secondary | ICD-10-CM | POA: Diagnosis not present

## 2018-11-13 DIAGNOSIS — G939 Disorder of brain, unspecified: Secondary | ICD-10-CM | POA: Diagnosis not present

## 2018-11-14 DIAGNOSIS — G939 Disorder of brain, unspecified: Secondary | ICD-10-CM | POA: Diagnosis not present

## 2018-11-15 DIAGNOSIS — G939 Disorder of brain, unspecified: Secondary | ICD-10-CM | POA: Diagnosis not present

## 2018-11-16 DIAGNOSIS — G939 Disorder of brain, unspecified: Secondary | ICD-10-CM | POA: Diagnosis not present

## 2018-11-18 DIAGNOSIS — G939 Disorder of brain, unspecified: Secondary | ICD-10-CM | POA: Diagnosis not present

## 2018-11-19 DIAGNOSIS — G939 Disorder of brain, unspecified: Secondary | ICD-10-CM | POA: Diagnosis not present

## 2018-11-20 DIAGNOSIS — G939 Disorder of brain, unspecified: Secondary | ICD-10-CM | POA: Diagnosis not present

## 2018-11-21 DIAGNOSIS — G939 Disorder of brain, unspecified: Secondary | ICD-10-CM | POA: Diagnosis not present

## 2018-11-22 DIAGNOSIS — G939 Disorder of brain, unspecified: Secondary | ICD-10-CM | POA: Diagnosis not present

## 2018-11-23 DIAGNOSIS — G939 Disorder of brain, unspecified: Secondary | ICD-10-CM | POA: Diagnosis not present

## 2018-11-24 DIAGNOSIS — G939 Disorder of brain, unspecified: Secondary | ICD-10-CM | POA: Diagnosis not present

## 2018-11-25 DIAGNOSIS — G939 Disorder of brain, unspecified: Secondary | ICD-10-CM | POA: Diagnosis not present

## 2018-11-26 DIAGNOSIS — G939 Disorder of brain, unspecified: Secondary | ICD-10-CM | POA: Diagnosis not present

## 2018-11-27 DIAGNOSIS — G939 Disorder of brain, unspecified: Secondary | ICD-10-CM | POA: Diagnosis not present

## 2018-11-28 DIAGNOSIS — G939 Disorder of brain, unspecified: Secondary | ICD-10-CM | POA: Diagnosis not present

## 2018-11-29 ENCOUNTER — Ambulatory Visit: Payer: Medicare HMO

## 2018-11-29 DIAGNOSIS — G939 Disorder of brain, unspecified: Secondary | ICD-10-CM | POA: Diagnosis not present

## 2018-11-29 NOTE — Progress Notes (Deleted)
Subjective:   George Glenn is a 58 y.o. male who presents for Medicare Annual/Subsequent preventive examination.  This visit is being conducted via phone call  - after an attmept to do on video chat - due to the COVID-19 pandemic. This patient has given me verbal consent via phone to conduct this visit, patient states they are participating from their home address. Some vital signs may be absent or patient reported.   Patient identification: identified by name, DOB, and current address.    Review of Systems:        Objective:    Vitals: There were no vitals taken for this visit.  There is no height or weight on file to calculate BMI.  Advanced Directives 06/08/2016 03/19/2016 03/16/2016 12/10/2014  Does Patient Have a Medical Advance Directive? Yes Yes Yes Yes  Type of Paramedic of [place];Living will Healthcare Power of [place]  Does patient want to make changes to medical advance directive? - No - Patient declined - No - Patient declined  Copy of [place] in Chart? - (No Data) No - copy requested No - copy requested  Would patient like information on creating a medical advance directive? - - - Yes - Scientist, clinical (histocompatibility and immunogenetics) given    Tobacco Social History   Tobacco Use  Smoking Status Never Smoker  Smokeless Tobacco Never Used     Counseling given: Not Answered   Clinical Intake:                 Nutrition Risk Assessment:  Has the patient had any N/V/D within the last 2 months?  {YES/NO:21197} Does the patient have any non-healing wounds?  {YES/NO:21197} Has the patient had any unintentional weight loss or weight gain?  {YES/NO:21197}  Diabetes:  Is the patient diabetic?  {YES/NO:21197} If diabetic, was a CBG obtained today?  {YES/NO:21197} Did the patient bring in their glucometer from home?  {YES/NO:21197} How often do you monitor your CBG's? ***.    Financial Strains and Diabetes Management:  Are you having any financial strains with the device, your supplies or your medication? {YES/NO:21197}.  Does the patient want to be seen by Chronic Care Management for management of their diabetes?  {YES/NO:21197} Would the patient like to be referred to a Nutritionist or for Diabetic Management?  {YES/NO:21197}  Diabetic Exams:  Diabetic Eye Exam: Overdue for diabetic eye exam. Pt has been advised about the importance in completing this exam.   Diabetic Foot Exam:  Pt has been advised about the importance in completing this exam.         Past Medical History:  Diagnosis Date  . Anemia   . Barrett's esophagus   . Brain damage   . Chronic kidney disease   . Diabetes mellitus without complication [facility])    sister states he is no longer diabetic due to diet changes and weight loss  . Dizziness    WHEN STANDING  . GERD (gastroesophageal reflux disease)    Barrett's  . Hyperlipidemia   . Hypertension   . Hypogonadism in male   . Memory loss, short term   . Osteoporosis   . Paranoid schizophrenia ([employer])   . Tic    HANDS   Past Surgical History:  Procedure Laterality Date  . CATARACT EXTRACTION W/PHACO Right 11/12/2014   Procedure: CATARACT EXTRACTION PHACO AND INTRAOCULAR LENS PLACEMENT (IOC);  Surgeon: Birder Robson, MD;  Location: [facility] ORS;  Service: Ophthalmology;  Laterality: Right;  cassette Lot # [id number]     [place]  00:45 AP 14.7 CDE 6.62  . CATARACT EXTRACTION W/PHACO Left 12/10/2014   Procedure: CATARACT EXTRACTION PHACO AND INTRAOCULAR LENS PLACEMENT (IOC);  Surgeon: Birder Robson, MD;  Location: [facility] ORS;  Service: Ophthalmology;  Laterality: Left;  US:00:46.1 AP:20.0 CDE:9.22 LOT PACK #[id number]  . COLONOSCOPY    . ESOPHAGOGASTRODUODENOSCOPY (EGD) WITH PROPOFOL N/A 04/23/2016   Procedure: ESOPHAGOGASTRODUODENOSCOPY (EGD) WITH PROPOFOL;  Surgeon: [name], MD;  Location: [facility] ENDOSCOPY;  Service: Endoscopy;  Laterality:  N/A;   Family History  Problem Relation Age of Onset  . Hypertension Father   . Diabetes Father   . Heart disease Father   . Hyperlipidemia Father   . Cancer Mother        Skin  . Dementia Mother   . Diabetes Brother   . Stroke Maternal Grandmother   . Stroke Maternal Grandfather   . Heart disease Paternal Grandmother   . Heart disease Paternal Grandfather    Social History   Socioeconomic History  . Marital status: Single    Spouse name: Not on file  . Number of children: Not on file  . Years of education: Not on file  . Highest education level: Not on file  Occupational History  . Not on file  Social Needs  . Financial resource strain: Not hard at all  . Food insecurity    Worry: Never true    Inability: Never true  . Transportation needs    Medical: No    Non-medical: No  Tobacco Use  . Smoking status: Never Smoker  . Smokeless tobacco: Never Used  Substance and Sexual Activity  . Alcohol use: No    Alcohol/week: 0.0 standard drinks  . Drug use: No  . Sexual activity: Never  Lifestyle  . Physical activity    Days per week: 0 days    Minutes per session: 0 min  . Stress: Not at all  Relationships  . Social Herbalist on phone: Never    Gets together: More than three times a week    Attends religious service: Never    Active member of club or organization: No    Attends meetings of clubs or organizations: Never    Relationship status: Never married  Other Topics Concern  . Not on file  Social History Narrative  . Not on file    Outpatient Encounter Medications as of 11/29/2018  Medication Sig  . Cyanocobalamin (VITAMIN B 12 PO) Take 1 tablet by mouth daily.   Mariane Baumgarten Calcium (STOOL SOFTENER PO) Take 1 capsule by mouth daily as needed.  Marland Kitchen esomeprazole (NEXIUM) 40 MG capsule Take 1 capsule (40 mg total) by mouth daily.  Marland Kitchen LORazepam (ATIVAN) 0.5 MG tablet TAKE 1 TABLET(0.5 MG) BY MOUTH AT BEDTIME  . simvastatin (ZOCOR) 10 MG tablet Take 1  tablet (10 mg total) by mouth daily.   No facility-administered encounter medications on file as of 11/29/2018.     Activities of Daily Living No flowsheet data found.  Patient Care Team: Volney American, PA-C as PCP - General (Family Medicine) Anabel Bene, MD as Referring Physician (Neurology) Ubaldo Glassing Javier Docker, MD as Consulting Physician (Cardiology)   Assessment:   This is a routine wellness examination for [place].  Exercise Activities and Dietary recommendations    Goals    . DIET - INCREASE WATER INTAKE     Recommend drinking at least 6-8 glasses of water  a day        Fall Risk Fall Risk  07/14/2017 06/08/2016 03/16/2016 12/03/2015  Falls in the past year? No No No No  Risk for fall due to : - - Mental status change -   FALL RISK PREVENTION PERTAINING TO THE HOME:  Any stairs in or around the home? {YES/NO:21197} If so, are there any without handrails? {YES/NO:21197}  Home free of loose throw rugs in walkways, pet beds, electrical cords, etc? {YES/NO:21197} Adequate lighting in your home to reduce risk of falls? {YES/NO:21197}  ASSISTIVE DEVICES UTILIZED TO PREVENT FALLS:  Life alert? {YES/NO:21197} Use of a cane, walker or w/c? {YES/NO:21197} Grab bars in the bathroom? {YES/NO:21197} Shower chair or bench in shower? {YES/NO:21197} Elevated toilet seat or a handicapped toilet? {YES/NO:21197}   TIMED UP AND GO:  Unable to perform    Depression Screen PHQ 2/9 Scores 07/14/2017 06/08/2016 03/19/2016 03/16/2016  PHQ - 2 Score 0 0 0 -  Exception Documentation - - - Other- indicate reason in comment box  Not completed - - - call completed with caregiver/sister    Cognitive Function        Immunization History  Administered Date(s) Administered  . Influenza,inj,Quad PF,6+ Mos 03/08/2016, 03/21/2018  . Pneumococcal Polysaccharide-23 03/22/2011    Qualifies for Shingles Vaccine? Yes  Zostavax completed ***. Due for Shingrix. Education has been  provided regarding the importance of this vaccine. Pt has been advised to call insurance company to determine out of pocket expense. Advised may also receive vaccine at local pharmacy or Health Dept. Verbalized acceptance and understanding.  Tdap: Discussed need for TD/TDAP vaccine, patient verbalized understanding that this is not covered as a preventative with there insurance and to call the office if he develops any new skin injuries, ie: cuts, scrapes, bug bites, or open wounds.  Flu Vaccine: Due 12/2018.  Pneumococcal Vaccine: up to date   Screening Tests Health Maintenance  Topic Date Due  . TETANUS/TDAP  10/17/2012  . OPHTHALMOLOGY EXAM  09/11/2015  . FOOT EXAM  06/08/2017  . COLONOSCOPY  10/29/2017  . INFLUENZA VACCINE  11/18/2018  . URINE MICROALBUMIN  11/26/2018  . HEMOGLOBIN A1C  12/01/2018  . PNEUMOCOCCAL POLYSACCHARIDE VACCINE AGE 86-64 HIGH RISK  Completed  . Hepatitis C Screening  Completed  . HIV Screening  Completed   Cancer Screenings:  Colorectal Screening: Completed ***. Repeat every *** years; No longer required. Referral to GI placed ***. Pt aware the office will call re: appt.  Lung Cancer Screening: (Low Dose CT Chest recommended if Age 63-80 years, 30 pack-year currently smoking OR have quit w/in 15years.) does not qualify.    Additional Screening:  Hepatitis C Screening: does qualify; Completed 07/30/2015  Dental Screening: Recommended annual dental exams for proper oral hygiene  Community Resource Referral:  CRR required this visit?  {YES/NO:21197}       Plan:  I have personally reviewed and addressed the Medicare Annual Wellness questionnaire and have noted the following in the patient's chart:  A. Medical and social history B. Use of alcohol, tobacco or illicit drugs  C. Current medications and supplements D. Functional ability and status E.  Nutritional status F.  Physical activity G. Advance directives H. List of other physicians I.   Hospitalizations, surgeries, and ER visits in previous 12 months J.  [place] such as hearing and vision if needed, cognitive and depression L. Referrals and appointments   In addition, I have reviewed and discussed with patient certain preventive protocols,  quality metrics, and best practice recommendations. A written personalized care plan for preventive services as well as general preventive health recommendations were provided to patient.   Signed,   Bevelyn Ngo, LPN  6/38/4665 Nurse Health Advisor  Nurse Notes:

## 2018-11-30 DIAGNOSIS — G939 Disorder of brain, unspecified: Secondary | ICD-10-CM | POA: Diagnosis not present

## 2018-12-01 DIAGNOSIS — G939 Disorder of brain, unspecified: Secondary | ICD-10-CM | POA: Diagnosis not present

## 2018-12-02 DIAGNOSIS — G939 Disorder of brain, unspecified: Secondary | ICD-10-CM | POA: Diagnosis not present

## 2018-12-10 DIAGNOSIS — G939 Disorder of brain, unspecified: Secondary | ICD-10-CM | POA: Diagnosis not present

## 2018-12-11 DIAGNOSIS — G939 Disorder of brain, unspecified: Secondary | ICD-10-CM | POA: Diagnosis not present

## 2018-12-12 ENCOUNTER — Other Ambulatory Visit: Payer: Self-pay | Admitting: Family Medicine

## 2018-12-12 DIAGNOSIS — G939 Disorder of brain, unspecified: Secondary | ICD-10-CM | POA: Diagnosis not present

## 2018-12-12 NOTE — Telephone Encounter (Signed)
Requested medication (s) are due for refill today: yes  Requested medication (s) are on the active medication list:yes  Last refill:  11/06/2018  Future visit scheduled: no  Notes to clinic:  This refill cannot be delegated   Requested Prescriptions  Pending Prescriptions Disp Refills   LORazepam (ATIVAN) 0.5 MG tablet [Pharmacy Med Name: LORAZEPAM 0.5MG  TABLETS] 30 tablet     Sig: TAKE 1 TABLET(0.5 MG) BY MOUTH AT BEDTIME     Not Delegated - Psychiatry:  Anxiolytics/Hypnotics Failed - 12/12/2018  1:40 PM      Failed - This refill cannot be delegated      Failed - Urine Drug Screen completed in last 360 days.      Failed - Valid encounter within last 6 months    Recent Outpatient Visits          6 months ago Mixed hyperlipidemia   [facility] Merrie Roof [place], [place]   8 months ago Paranoid schizophrenia [facility])   [facility] Merrie Roof [place], [place]   1 year ago Type 2 diabetes mellitus without complication, without long-term current use of insulin [facility])   [facility] Merrie Roof [place], [place]   2 years ago Nocturia   [facility] Kathrine Haddock, NP   2 years ago Need for influenza vaccination   [facility] Kathrine Haddock, NP

## 2018-12-13 DIAGNOSIS — G939 Disorder of brain, unspecified: Secondary | ICD-10-CM | POA: Diagnosis not present

## 2018-12-14 DIAGNOSIS — G939 Disorder of brain, unspecified: Secondary | ICD-10-CM | POA: Diagnosis not present

## 2018-12-15 DIAGNOSIS — G939 Disorder of brain, unspecified: Secondary | ICD-10-CM | POA: Diagnosis not present

## 2018-12-16 DIAGNOSIS — G939 Disorder of brain, unspecified: Secondary | ICD-10-CM | POA: Diagnosis not present

## 2018-12-17 DIAGNOSIS — G939 Disorder of brain, unspecified: Secondary | ICD-10-CM | POA: Diagnosis not present

## 2018-12-18 DIAGNOSIS — G939 Disorder of brain, unspecified: Secondary | ICD-10-CM | POA: Diagnosis not present

## 2018-12-19 DIAGNOSIS — G939 Disorder of brain, unspecified: Secondary | ICD-10-CM | POA: Diagnosis not present

## 2018-12-20 DIAGNOSIS — G939 Disorder of brain, unspecified: Secondary | ICD-10-CM | POA: Diagnosis not present

## 2018-12-21 DIAGNOSIS — G939 Disorder of brain, unspecified: Secondary | ICD-10-CM | POA: Diagnosis not present

## 2018-12-22 DIAGNOSIS — G939 Disorder of brain, unspecified: Secondary | ICD-10-CM | POA: Diagnosis not present

## 2018-12-23 DIAGNOSIS — G939 Disorder of brain, unspecified: Secondary | ICD-10-CM | POA: Diagnosis not present

## 2018-12-24 DIAGNOSIS — G939 Disorder of brain, unspecified: Secondary | ICD-10-CM | POA: Diagnosis not present

## 2018-12-25 DIAGNOSIS — G939 Disorder of brain, unspecified: Secondary | ICD-10-CM | POA: Diagnosis not present

## 2018-12-26 DIAGNOSIS — G939 Disorder of brain, unspecified: Secondary | ICD-10-CM | POA: Diagnosis not present

## 2018-12-27 DIAGNOSIS — G939 Disorder of brain, unspecified: Secondary | ICD-10-CM | POA: Diagnosis not present

## 2018-12-28 DIAGNOSIS — G939 Disorder of brain, unspecified: Secondary | ICD-10-CM | POA: Diagnosis not present

## 2018-12-29 DIAGNOSIS — G939 Disorder of brain, unspecified: Secondary | ICD-10-CM | POA: Diagnosis not present

## 2018-12-30 DIAGNOSIS — G939 Disorder of brain, unspecified: Secondary | ICD-10-CM | POA: Diagnosis not present

## 2018-12-31 DIAGNOSIS — G939 Disorder of brain, unspecified: Secondary | ICD-10-CM | POA: Diagnosis not present

## 2019-01-01 DIAGNOSIS — G939 Disorder of brain, unspecified: Secondary | ICD-10-CM | POA: Diagnosis not present

## 2019-01-02 DIAGNOSIS — G939 Disorder of brain, unspecified: Secondary | ICD-10-CM | POA: Diagnosis not present

## 2019-01-03 DIAGNOSIS — G939 Disorder of brain, unspecified: Secondary | ICD-10-CM | POA: Diagnosis not present

## 2019-01-04 DIAGNOSIS — G939 Disorder of brain, unspecified: Secondary | ICD-10-CM | POA: Diagnosis not present

## 2019-01-05 DIAGNOSIS — G939 Disorder of brain, unspecified: Secondary | ICD-10-CM | POA: Diagnosis not present

## 2019-01-06 DIAGNOSIS — G939 Disorder of brain, unspecified: Secondary | ICD-10-CM | POA: Diagnosis not present

## 2019-01-07 DIAGNOSIS — G939 Disorder of brain, unspecified: Secondary | ICD-10-CM | POA: Diagnosis not present

## 2019-01-08 DIAGNOSIS — G939 Disorder of brain, unspecified: Secondary | ICD-10-CM | POA: Diagnosis not present

## 2019-01-09 DIAGNOSIS — G939 Disorder of brain, unspecified: Secondary | ICD-10-CM | POA: Diagnosis not present

## 2019-01-10 DIAGNOSIS — G939 Disorder of brain, unspecified: Secondary | ICD-10-CM | POA: Diagnosis not present

## 2019-01-11 DIAGNOSIS — G939 Disorder of brain, unspecified: Secondary | ICD-10-CM | POA: Diagnosis not present

## 2019-01-12 DIAGNOSIS — G939 Disorder of brain, unspecified: Secondary | ICD-10-CM | POA: Diagnosis not present

## 2019-01-13 DIAGNOSIS — G939 Disorder of brain, unspecified: Secondary | ICD-10-CM | POA: Diagnosis not present

## 2019-01-14 DIAGNOSIS — G939 Disorder of brain, unspecified: Secondary | ICD-10-CM | POA: Diagnosis not present

## 2019-01-15 DIAGNOSIS — G939 Disorder of brain, unspecified: Secondary | ICD-10-CM | POA: Diagnosis not present

## 2019-01-16 DIAGNOSIS — G939 Disorder of brain, unspecified: Secondary | ICD-10-CM | POA: Diagnosis not present

## 2019-01-17 DIAGNOSIS — G939 Disorder of brain, unspecified: Secondary | ICD-10-CM | POA: Diagnosis not present

## 2019-01-18 DIAGNOSIS — G939 Disorder of brain, unspecified: Secondary | ICD-10-CM | POA: Diagnosis not present

## 2019-01-19 DIAGNOSIS — G939 Disorder of brain, unspecified: Secondary | ICD-10-CM | POA: Diagnosis not present

## 2019-01-20 DIAGNOSIS — G939 Disorder of brain, unspecified: Secondary | ICD-10-CM | POA: Diagnosis not present

## 2019-01-21 DIAGNOSIS — G939 Disorder of brain, unspecified: Secondary | ICD-10-CM | POA: Diagnosis not present

## 2019-01-22 ENCOUNTER — Telehealth: Payer: Self-pay

## 2019-01-22 DIAGNOSIS — G939 Disorder of brain, unspecified: Secondary | ICD-10-CM | POA: Diagnosis not present

## 2019-01-22 MED ORDER — LORAZEPAM 0.5 MG PO TABS: ORAL_TABLET | ORAL | 0 refills | Status: DC

## 2019-01-22 NOTE — Telephone Encounter (Signed)
Medication refill request for Lorazepam.   Last office visit in February.  Last refill: 12/12/2018 No Show: 11/29/2018  George Glenn patient needs F/U appointment scheduled. But if I sch/d him, would you refill his lorazepam to make it to his appointment?

## 2019-01-22 NOTE — Telephone Encounter (Signed)
1 month supply sent to bridge until he can get in for appt

## 2019-01-22 NOTE — Telephone Encounter (Signed)
Called pt to let him know about the 1 month refill and that he needs an appt, no answer, left vm

## 2019-01-23 DIAGNOSIS — G939 Disorder of brain, unspecified: Secondary | ICD-10-CM | POA: Diagnosis not present

## 2019-01-24 DIAGNOSIS — G939 Disorder of brain, unspecified: Secondary | ICD-10-CM | POA: Diagnosis not present

## 2019-01-25 ENCOUNTER — Other Ambulatory Visit: Payer: Self-pay

## 2019-01-25 DIAGNOSIS — G939 Disorder of brain, unspecified: Secondary | ICD-10-CM | POA: Diagnosis not present

## 2019-01-25 MED ORDER — ESOMEPRAZOLE MAGNESIUM 40 MG PO CPDR: 40.0000 mg | DELAYED_RELEASE_CAPSULE | Freq: Every day | ORAL | 1 refills | Status: DC

## 2019-01-26 ENCOUNTER — Other Ambulatory Visit: Payer: Self-pay

## 2019-01-26 DIAGNOSIS — G939 Disorder of brain, unspecified: Secondary | ICD-10-CM | POA: Diagnosis not present

## 2019-01-26 MED ORDER — SIMVASTATIN 10 MG PO TABS: 10.0000 mg | ORAL_TABLET | Freq: Every day | ORAL | 1 refills | Status: DC

## 2019-01-26 NOTE — Telephone Encounter (Signed)
Upcoming appointment 02/07/19

## 2019-01-27 DIAGNOSIS — G939 Disorder of brain, unspecified: Secondary | ICD-10-CM | POA: Diagnosis not present

## 2019-01-28 DIAGNOSIS — G939 Disorder of brain, unspecified: Secondary | ICD-10-CM | POA: Diagnosis not present

## 2019-01-29 DIAGNOSIS — G939 Disorder of brain, unspecified: Secondary | ICD-10-CM | POA: Diagnosis not present

## 2019-01-30 DIAGNOSIS — G939 Disorder of brain, unspecified: Secondary | ICD-10-CM | POA: Diagnosis not present

## 2019-01-31 DIAGNOSIS — G939 Disorder of brain, unspecified: Secondary | ICD-10-CM | POA: Diagnosis not present

## 2019-02-01 DIAGNOSIS — G939 Disorder of brain, unspecified: Secondary | ICD-10-CM | POA: Diagnosis not present

## 2019-02-02 DIAGNOSIS — G939 Disorder of brain, unspecified: Secondary | ICD-10-CM | POA: Diagnosis not present

## 2019-02-03 DIAGNOSIS — G939 Disorder of brain, unspecified: Secondary | ICD-10-CM | POA: Diagnosis not present

## 2019-02-04 DIAGNOSIS — G939 Disorder of brain, unspecified: Secondary | ICD-10-CM | POA: Diagnosis not present

## 2019-02-05 DIAGNOSIS — G939 Disorder of brain, unspecified: Secondary | ICD-10-CM | POA: Diagnosis not present

## 2019-02-06 DIAGNOSIS — G939 Disorder of brain, unspecified: Secondary | ICD-10-CM | POA: Diagnosis not present

## 2019-02-07 ENCOUNTER — Other Ambulatory Visit: Payer: Self-pay

## 2019-02-07 ENCOUNTER — Ambulatory Visit (INDEPENDENT_AMBULATORY_CARE_PROVIDER_SITE_OTHER): Payer: Medicare HMO | Admitting: Family Medicine

## 2019-02-07 ENCOUNTER — Encounter: Payer: Self-pay | Admitting: Family Medicine

## 2019-02-07 VITALS — BP 124/88 | HR 75 | Temp 98.3°F

## 2019-02-07 DIAGNOSIS — R69 Illness, unspecified: Secondary | ICD-10-CM | POA: Diagnosis not present

## 2019-02-07 DIAGNOSIS — E782 Mixed hyperlipidemia: Secondary | ICD-10-CM | POA: Diagnosis not present

## 2019-02-07 DIAGNOSIS — K2270 Barrett's esophagus without dysplasia: Secondary | ICD-10-CM | POA: Diagnosis not present

## 2019-02-07 DIAGNOSIS — R7301 Impaired fasting glucose: Secondary | ICD-10-CM

## 2019-02-07 DIAGNOSIS — F200 Paranoid schizophrenia: Secondary | ICD-10-CM

## 2019-02-07 DIAGNOSIS — Z23 Encounter for immunization: Secondary | ICD-10-CM

## 2019-02-07 DIAGNOSIS — G939 Disorder of brain, unspecified: Secondary | ICD-10-CM | POA: Diagnosis not present

## 2019-02-07 NOTE — Progress Notes (Signed)
BP 124/88   Pulse 75   Temp 98.3 F (36.8 C) (Oral)   SpO2 95%    Subjective:    Patient ID: George Glenn, male    DOB: 1960/05/20, 58 y.o.   MRN: [id number]  HPI: George Glenn is a 58 y.o. male  Chief Complaint  Patient presents with  . Hyperlipidemia  . Schizophrenia   Here today for 6 month f/u with his sister, who is his primary caregiver and helps provide most of the history today. States he has no new concerns, things are going well. Taking medicines faithfully without side effects. Trying to eat well and stay active. Denies Cp, SOB, HAs, dizziness, claudication, myalgias, low blood sugar spells.   IFG - diet controlled, sister helps keep his diet healthy and he does stay active.   HLD - on simvastatin without issue.   Schizophrenia - stable for years on ativan regimen. Takes one daily at bedtime which he's always tolerated well.   Depression screen [facility] 2/9 02/07/2019 07/14/2017 06/08/2016  Decreased Interest 0 0 0  Down, Depressed, Hopeless 0 0 0  PHQ - 2 Score 0 0 0  No flowsheet data found.    Relevant past medical, surgical, family and social history reviewed and updated as indicated. Interim medical history since our last visit reviewed. Allergies and medications reviewed and updated.  Review of Systems  Per HPI unless specifically indicated above     Objective:    BP 124/88   Pulse 75   Temp 98.3 F (36.8 C) (Oral)   SpO2 95%   Wt Readings from Last 3 Encounters:  06/02/18 194 lb 8 oz (88.2 kg)  03/21/18 192 lb 6.4 oz (87.3 kg)  11/25/17 195 lb (88.5 kg)    Physical Exam Vitals signs and nursing note reviewed.  Constitutional:      Appearance: Normal appearance.  HENT:     Head: Atraumatic.  Eyes:     Extraocular Movements: Extraocular movements intact.     Conjunctiva/sclera: Conjunctivae normal.  Neck:     Musculoskeletal: Normal range of motion and neck supple.  Cardiovascular:     Rate and Rhythm: Normal rate and regular  rhythm.  Pulmonary:     Effort: Pulmonary effort is normal.     Breath sounds: Normal breath sounds.  Musculoskeletal: Normal range of motion.  Skin:    General: Skin is warm and dry.  Neurological:     General: No focal deficit present.     Mental Status: Mental status is at baseline.  Psychiatric:        Mood and Affect: Mood normal.     Results for orders placed or performed in visit on 02/07/19  Lipid Panel w/o Chol/HDL Ratio  Result Value Ref Range   Cholesterol, Total 127 100 - 199 mg/dL   Triglycerides 65 0 - 149 mg/dL   HDL 28 (L) >39 mg/dL   VLDL Cholesterol Cal 14 5 - 40 mg/dL   LDL Chol Calc (NIH) 85 0 - 99 mg/dL  Comprehensive metabolic panel  Result Value Ref Range   Glucose 100 (H) 65 - 99 mg/dL   BUN 16 6 - 24 mg/dL   Creatinine, Ser 0.98 0.76 - 1.27 mg/dL   GFR calc non Af Amer 84 >59 mL/min/1.73   GFR calc Af Amer 97 >59 mL/min/1.73   BUN/Creatinine Ratio 16 9 - 20   Sodium 140 134 - 144 mmol/L   Potassium 4.1 3.5 - 5.2 mmol/L   Chloride 101  96 - 106 mmol/L   CO2 25 20 - 29 mmol/L   Calcium 9.1 8.7 - 10.2 mg/dL   Total Protein 7.1 6.0 - 8.5 g/dL   Albumin 4.8 3.8 - 4.9 g/dL   Globulin, Total 2.3 1.5 - 4.5 g/dL   Albumin/Globulin Ratio 2.1 1.2 - 2.2   Bilirubin Total 0.5 0.0 - 1.2 mg/dL   Alkaline Phosphatase 88 39 - 117 IU/L   AST 28 0 - 40 IU/L   ALT 44 0 - 44 IU/L  HgB A1c  Result Value Ref Range   Hgb A1c MFr Bld 6.3 (H) 4.8 - 5.6 %   Est. average glucose Bld gHb Est-mCnc 134 mg/dL      Assessment & Plan:   Problem List Items Addressed This Visit      Digestive   Barrett's esophagus    Taking nexium daily with good control, continue current regimen. Followed by GI        Endocrine   IFG (impaired fasting glucose) - Primary    Recheck A1C, continue good lifestyle habits for control      Relevant Orders   HgB A1c (Completed)     Other   Paranoid schizophrenia (HCC)    Stable on lorazepam nightly. Continue current regimen       Hyperlipidemia    Recheck lipids, adjust as needed. Continue current regimen and good lifestyle habits      Relevant Orders   Lipid Panel w/o Chol/HDL Ratio (Completed)   Comprehensive metabolic panel (Completed)    Other Visit Diagnoses    Need for influenza vaccination       Relevant Orders   Flu Vaccine QUAD 36+ mos IM (Completed)       Follow up plan: Return in about 6 months (around 08/08/2019) for CPE.

## 2019-02-07 NOTE — Patient Instructions (Signed)

## 2019-02-08 DIAGNOSIS — G939 Disorder of brain, unspecified: Secondary | ICD-10-CM | POA: Diagnosis not present

## 2019-02-08 LAB — HEMOGLOBIN A1C
Est. average glucose Bld gHb Est-mCnc: 134 mg/dL
Hgb A1c MFr Bld: 6.3 % — ABNORMAL HIGH (ref 4.8–5.6)

## 2019-02-08 LAB — COMPREHENSIVE METABOLIC PANEL
ALT: 44 IU/L (ref 0–44)
AST: 28 IU/L (ref 0–40)
Albumin/Globulin Ratio: 2.1 (ref 1.2–2.2)
Albumin: 4.8 g/dL (ref 3.8–4.9)
Alkaline Phosphatase: 88 IU/L (ref 39–117)
BUN/Creatinine Ratio: 16 (ref 9–20)
BUN: 16 mg/dL (ref 6–24)
Bilirubin Total: 0.5 mg/dL (ref 0.0–1.2)
CO2: 25 mmol/L (ref 20–29)
Calcium: 9.1 mg/dL (ref 8.7–10.2)
Chloride: 101 mmol/L (ref 96–106)
Creatinine, Ser: 0.98 mg/dL (ref 0.76–1.27)
GFR calc Af Amer: 97 mL/min/{1.73_m2} (ref >59)
GFR calc non Af Amer: 84 mL/min/{1.73_m2} (ref >59)
Globulin, Total: 2.3 g/dL (ref 1.5–4.5)
Glucose: 100 mg/dL — ABNORMAL HIGH (ref 65–99)
Potassium: 4.1 mmol/L (ref 3.5–5.2)
Sodium: 140 mmol/L (ref 134–144)
Total Protein: 7.1 g/dL (ref 6.0–8.5)

## 2019-02-08 LAB — LIPID PANEL W/O CHOL/HDL RATIO
Cholesterol, Total: 127 mg/dL (ref 100–199)
HDL: 28 mg/dL — ABNORMAL LOW (ref >39)
LDL Chol Calc (NIH): 85 mg/dL (ref 0–99)
Triglycerides: 65 mg/dL (ref 0–149)
VLDL Cholesterol Cal: 14 mg/dL (ref 5–40)

## 2019-02-09 ENCOUNTER — Encounter: Payer: Self-pay | Admitting: Family Medicine

## 2019-02-09 DIAGNOSIS — G939 Disorder of brain, unspecified: Secondary | ICD-10-CM | POA: Diagnosis not present

## 2019-02-10 DIAGNOSIS — G939 Disorder of brain, unspecified: Secondary | ICD-10-CM | POA: Diagnosis not present

## 2019-02-12 DIAGNOSIS — G939 Disorder of brain, unspecified: Secondary | ICD-10-CM | POA: Diagnosis not present

## 2019-02-12 MED ORDER — LORAZEPAM 0.5 MG PO TABS: ORAL_TABLET | ORAL | 5 refills | Status: DC

## 2019-02-12 NOTE — Assessment & Plan Note (Signed)
Stable on lorazepam nightly. Continue current regimen

## 2019-02-12 NOTE — Assessment & Plan Note (Signed)
Recheck A1C, continue good lifestyle habits for control

## 2019-02-12 NOTE — Assessment & Plan Note (Signed)
Recheck lipids, adjust as needed. Continue current regimen and good lifestyle habits 

## 2019-02-12 NOTE — Assessment & Plan Note (Signed)
Taking nexium daily with good control, continue current regimen. Followed by GI

## 2019-02-13 DIAGNOSIS — G939 Disorder of brain, unspecified: Secondary | ICD-10-CM | POA: Diagnosis not present

## 2019-02-14 DIAGNOSIS — G939 Disorder of brain, unspecified: Secondary | ICD-10-CM | POA: Diagnosis not present

## 2019-02-15 DIAGNOSIS — G939 Disorder of brain, unspecified: Secondary | ICD-10-CM | POA: Diagnosis not present

## 2019-02-16 DIAGNOSIS — G939 Disorder of brain, unspecified: Secondary | ICD-10-CM | POA: Diagnosis not present

## 2019-02-17 DIAGNOSIS — G939 Disorder of brain, unspecified: Secondary | ICD-10-CM | POA: Diagnosis not present

## 2019-02-18 DIAGNOSIS — G939 Disorder of brain, unspecified: Secondary | ICD-10-CM | POA: Diagnosis not present

## 2019-02-19 DIAGNOSIS — G939 Disorder of brain, unspecified: Secondary | ICD-10-CM | POA: Diagnosis not present

## 2019-02-20 DIAGNOSIS — G939 Disorder of brain, unspecified: Secondary | ICD-10-CM | POA: Diagnosis not present

## 2019-02-21 DIAGNOSIS — G939 Disorder of brain, unspecified: Secondary | ICD-10-CM | POA: Diagnosis not present

## 2019-02-22 DIAGNOSIS — G939 Disorder of brain, unspecified: Secondary | ICD-10-CM | POA: Diagnosis not present

## 2019-02-23 DIAGNOSIS — G939 Disorder of brain, unspecified: Secondary | ICD-10-CM | POA: Diagnosis not present

## 2019-02-24 DIAGNOSIS — G939 Disorder of brain, unspecified: Secondary | ICD-10-CM | POA: Diagnosis not present

## 2019-02-25 DIAGNOSIS — G939 Disorder of brain, unspecified: Secondary | ICD-10-CM | POA: Diagnosis not present

## 2019-02-26 DIAGNOSIS — G939 Disorder of brain, unspecified: Secondary | ICD-10-CM | POA: Diagnosis not present

## 2019-02-27 DIAGNOSIS — G939 Disorder of brain, unspecified: Secondary | ICD-10-CM | POA: Diagnosis not present

## 2019-02-28 DIAGNOSIS — G939 Disorder of brain, unspecified: Secondary | ICD-10-CM | POA: Diagnosis not present

## 2019-03-01 DIAGNOSIS — G939 Disorder of brain, unspecified: Secondary | ICD-10-CM | POA: Diagnosis not present

## 2019-03-02 DIAGNOSIS — G939 Disorder of brain, unspecified: Secondary | ICD-10-CM | POA: Diagnosis not present

## 2019-03-03 DIAGNOSIS — G939 Disorder of brain, unspecified: Secondary | ICD-10-CM | POA: Diagnosis not present

## 2019-03-04 DIAGNOSIS — G939 Disorder of brain, unspecified: Secondary | ICD-10-CM | POA: Diagnosis not present

## 2019-03-05 DIAGNOSIS — G939 Disorder of brain, unspecified: Secondary | ICD-10-CM | POA: Diagnosis not present

## 2019-03-06 DIAGNOSIS — G939 Disorder of brain, unspecified: Secondary | ICD-10-CM | POA: Diagnosis not present

## 2019-03-07 DIAGNOSIS — G939 Disorder of brain, unspecified: Secondary | ICD-10-CM | POA: Diagnosis not present

## 2019-03-08 DIAGNOSIS — G939 Disorder of brain, unspecified: Secondary | ICD-10-CM | POA: Diagnosis not present

## 2019-03-09 DIAGNOSIS — G939 Disorder of brain, unspecified: Secondary | ICD-10-CM | POA: Diagnosis not present

## 2019-03-10 DIAGNOSIS — G939 Disorder of brain, unspecified: Secondary | ICD-10-CM | POA: Diagnosis not present

## 2019-03-11 DIAGNOSIS — G939 Disorder of brain, unspecified: Secondary | ICD-10-CM | POA: Diagnosis not present

## 2019-03-12 DIAGNOSIS — G939 Disorder of brain, unspecified: Secondary | ICD-10-CM | POA: Diagnosis not present

## 2019-03-13 DIAGNOSIS — G939 Disorder of brain, unspecified: Secondary | ICD-10-CM | POA: Diagnosis not present

## 2019-03-14 DIAGNOSIS — G939 Disorder of brain, unspecified: Secondary | ICD-10-CM | POA: Diagnosis not present

## 2019-03-15 DIAGNOSIS — G939 Disorder of brain, unspecified: Secondary | ICD-10-CM | POA: Diagnosis not present

## 2019-03-16 DIAGNOSIS — G939 Disorder of brain, unspecified: Secondary | ICD-10-CM | POA: Diagnosis not present

## 2019-03-17 DIAGNOSIS — G939 Disorder of brain, unspecified: Secondary | ICD-10-CM | POA: Diagnosis not present

## 2019-03-18 DIAGNOSIS — G939 Disorder of brain, unspecified: Secondary | ICD-10-CM | POA: Diagnosis not present

## 2019-03-19 DIAGNOSIS — G939 Disorder of brain, unspecified: Secondary | ICD-10-CM | POA: Diagnosis not present

## 2019-03-20 DIAGNOSIS — G939 Disorder of brain, unspecified: Secondary | ICD-10-CM | POA: Diagnosis not present

## 2019-03-21 DIAGNOSIS — G939 Disorder of brain, unspecified: Secondary | ICD-10-CM | POA: Diagnosis not present

## 2019-03-22 DIAGNOSIS — G939 Disorder of brain, unspecified: Secondary | ICD-10-CM | POA: Diagnosis not present

## 2019-03-23 DIAGNOSIS — G939 Disorder of brain, unspecified: Secondary | ICD-10-CM | POA: Diagnosis not present

## 2019-03-24 DIAGNOSIS — G939 Disorder of brain, unspecified: Secondary | ICD-10-CM | POA: Diagnosis not present

## 2019-03-25 DIAGNOSIS — G939 Disorder of brain, unspecified: Secondary | ICD-10-CM | POA: Diagnosis not present

## 2019-03-26 DIAGNOSIS — G939 Disorder of brain, unspecified: Secondary | ICD-10-CM | POA: Diagnosis not present

## 2019-03-27 DIAGNOSIS — G939 Disorder of brain, unspecified: Secondary | ICD-10-CM | POA: Diagnosis not present

## 2019-03-28 DIAGNOSIS — G939 Disorder of brain, unspecified: Secondary | ICD-10-CM | POA: Diagnosis not present

## 2019-03-29 DIAGNOSIS — G939 Disorder of brain, unspecified: Secondary | ICD-10-CM | POA: Diagnosis not present

## 2019-03-30 DIAGNOSIS — G939 Disorder of brain, unspecified: Secondary | ICD-10-CM | POA: Diagnosis not present

## 2019-03-31 DIAGNOSIS — G939 Disorder of brain, unspecified: Secondary | ICD-10-CM | POA: Diagnosis not present

## 2019-04-01 DIAGNOSIS — G939 Disorder of brain, unspecified: Secondary | ICD-10-CM | POA: Diagnosis not present

## 2019-04-02 DIAGNOSIS — G939 Disorder of brain, unspecified: Secondary | ICD-10-CM | POA: Diagnosis not present

## 2019-04-03 DIAGNOSIS — G939 Disorder of brain, unspecified: Secondary | ICD-10-CM | POA: Diagnosis not present

## 2019-04-04 DIAGNOSIS — G939 Disorder of brain, unspecified: Secondary | ICD-10-CM | POA: Diagnosis not present

## 2019-04-05 DIAGNOSIS — G939 Disorder of brain, unspecified: Secondary | ICD-10-CM | POA: Diagnosis not present

## 2019-04-06 DIAGNOSIS — G939 Disorder of brain, unspecified: Secondary | ICD-10-CM | POA: Diagnosis not present

## 2019-04-07 DIAGNOSIS — G939 Disorder of brain, unspecified: Secondary | ICD-10-CM | POA: Diagnosis not present

## 2019-04-08 DIAGNOSIS — G939 Disorder of brain, unspecified: Secondary | ICD-10-CM | POA: Diagnosis not present

## 2019-04-09 DIAGNOSIS — G939 Disorder of brain, unspecified: Secondary | ICD-10-CM | POA: Diagnosis not present

## 2019-04-10 DIAGNOSIS — G939 Disorder of brain, unspecified: Secondary | ICD-10-CM | POA: Diagnosis not present

## 2019-04-11 DIAGNOSIS — G939 Disorder of brain, unspecified: Secondary | ICD-10-CM | POA: Diagnosis not present

## 2019-04-12 DIAGNOSIS — G939 Disorder of brain, unspecified: Secondary | ICD-10-CM | POA: Diagnosis not present

## 2019-04-14 DIAGNOSIS — G939 Disorder of brain, unspecified: Secondary | ICD-10-CM | POA: Diagnosis not present

## 2019-04-15 DIAGNOSIS — G939 Disorder of brain, unspecified: Secondary | ICD-10-CM | POA: Diagnosis not present

## 2019-04-16 DIAGNOSIS — G939 Disorder of brain, unspecified: Secondary | ICD-10-CM | POA: Diagnosis not present

## 2019-04-17 DIAGNOSIS — G939 Disorder of brain, unspecified: Secondary | ICD-10-CM | POA: Diagnosis not present

## 2019-04-18 DIAGNOSIS — G939 Disorder of brain, unspecified: Secondary | ICD-10-CM | POA: Diagnosis not present

## 2019-04-19 DIAGNOSIS — G939 Disorder of brain, unspecified: Secondary | ICD-10-CM | POA: Diagnosis not present

## 2019-06-01 ENCOUNTER — Ambulatory Visit (INDEPENDENT_AMBULATORY_CARE_PROVIDER_SITE_OTHER): Payer: Medicare HMO | Admitting: Family Medicine

## 2019-06-01 ENCOUNTER — Ambulatory Visit: Payer: Self-pay

## 2019-06-01 ENCOUNTER — Other Ambulatory Visit: Payer: Self-pay

## 2019-06-01 ENCOUNTER — Encounter: Payer: Self-pay | Admitting: Family Medicine

## 2019-06-01 VITALS — BP 120/81 | HR 73 | Temp 98.3°F | Wt 195.0 lb

## 2019-06-01 DIAGNOSIS — R42 Dizziness and giddiness: Secondary | ICD-10-CM | POA: Diagnosis not present

## 2019-06-01 MED ORDER — MECLIZINE HCL 25 MG PO TABS: 25.0000 mg | ORAL_TABLET | Freq: Three times a day (TID) | ORAL | 0 refills | Status: DC | PRN

## 2019-06-01 NOTE — Telephone Encounter (Signed)
Scheduled

## 2019-06-01 NOTE — Telephone Encounter (Signed)
OK to work in today

## 2019-06-01 NOTE — Telephone Encounter (Signed)
Pt.'s sister, who is his caregiver, reports pt. Started having dizzy episodes yesterday. Lasts a few seconds to minutes. Happens while he is sitting and when he is up moving around. Reports he has a history of diabetes, but is not currently on medication. His glucometer is broken. She is concerned about his blood sugar. No chest pain or shortness of breath.Eating and drinking well. Alert and oriented.No availability in the practice today. Office currently closed for lunch. Sister wants to know if she can bring him in for a glucose check. Please advise.  Reason for Disposition . [1] MODERATE dizziness (e.g., interferes with normal activities) AND [2] has NOT been evaluated by physician for this  (Exception: dizziness caused by heat exposure, sudden standing, or poor fluid intake)  Answer Assessment - Initial Assessment Questions 1. DESCRIPTION: "Describe your dizziness."     Dizzy 2. LIGHTHEADED: "Do you feel lightheaded?" (e.g., somewhat faint, woozy, weak upon standing)     Gets dizzy at rest and while up 3. VERTIGO: "Do you feel like either you or the room is spinning or tilting?" (i.e. vertigo)     Yes 4. SEVERITY: "How bad is it?"  "Do you feel like you are going to faint?" "Can you stand and walk?"   - MILD - walking normally   - MODERATE - interferes with normal activities (e.g., work, school)    - SEVERE - unable to stand, requires support to walk, feels like passing out now.      Moderate 5. ONSET:  "When did the dizziness begin?"     Last night 6. AGGRAVATING FACTORS: "Does anything make it worse?" (e.g., standing, change in head position)     No 7. HEART RATE: "Can you tell me your heart rate?" "How many beats in 15 seconds?"  (Note: not all patients can do this)       No 8. CAUSE: "What do you think is causing the dizziness?"     Maybe his blood sugar 9. RECURRENT SYMPTOM: "Have you had dizziness before?" If so, ask: "When was the last time?" "What happened that time?"      Yes 10. OTHER SYMPTOMS: "Do you have any other symptoms?" (e.g., fever, chest pain, vomiting, diarrhea, bleeding)       No 11. PREGNANCY: "Is there any chance you are pregnant?" "When was your last menstrual period?"       n/a  Protocols used: DIZZINESS [facility]

## 2019-06-01 NOTE — Progress Notes (Signed)
BP 120/81   Pulse 73   Temp 98.3 F (36.8 C) (Oral)   Wt 195 lb (88.5 kg)   SpO2 95%   BMI 27.58 kg/m    Subjective:    Patient ID: George Glenn, male    DOB: 11/27/1960, 59 y.o.   MRN: [id number]  HPI: George Glenn is a 59 y.o. male  Chief Complaint  Patient presents with  . Dizziness   Patient presenting today with his sister who is primary caregiver and provides nearly all of hx. States he's been complaining of dizziness since yesterday, coming and going in short bursts that feel like room spinning around him. Has had several episodes since onset but currently feeling at baseline. Notes the sxs are worse with laying down on back. Denies N/V, CP, SOB, syncope. Sister is concerned about his blood sugars as he's been sneaking sweets frequently lately and has known diabetes which has presented as dizziness in the past when poorly controlled.   Relevant past medical, surgical, family and social history reviewed and updated as indicated. Interim medical history since our last visit reviewed. Allergies and medications reviewed and updated.  Review of Systems  Per HPI unless specifically indicated above     Objective:    BP 120/81   Pulse 73   Temp 98.3 F (36.8 C) (Oral)   Wt 195 lb (88.5 kg)   SpO2 95%   BMI 27.58 kg/m   Wt Readings from Last 3 Encounters:  06/01/19 195 lb (88.5 kg)  06/02/18 194 lb 8 oz (88.2 kg)  03/21/18 192 lb 6.4 oz (87.3 kg)    Physical Exam Vitals and nursing note reviewed.  Constitutional:      Appearance: Normal appearance.  HENT:     Head: Atraumatic.     Right Ear: Tympanic membrane normal.     Left Ear: Tympanic membrane normal.     Nose: Nose normal.     Mouth/Throat:     Mouth: Mucous membranes are moist.     Pharynx: No posterior oropharyngeal erythema.  Eyes:     Extraocular Movements: Extraocular movements intact.     Conjunctiva/sclera: Conjunctivae normal.  Cardiovascular:     Rate and Rhythm: Normal rate and  regular rhythm.  Pulmonary:     Effort: Pulmonary effort is normal.     Breath sounds: Normal breath sounds.  Musculoskeletal:        General: Normal range of motion.     Cervical back: Normal range of motion and neck supple. Tenderness present.  Skin:    General: Skin is warm and dry.  Neurological:     Mental Status: Mental status is at baseline.     Sensory: No sensory deficit.     Motor: No weakness.     Coordination: Coordination normal.     Gait: Gait normal.  Psychiatric:        Mood and Affect: Mood normal.        Thought Content: Thought content normal.        Judgment: Judgment normal.     Results for orders placed or performed in visit on 06/01/19  CBC With Differential/Platelet  Result Value Ref Range   WBC 4.8 3.4 - 10.8 x10E3/uL   RBC 5.56 4.14 - 5.80 x10E6/uL   Hemoglobin 17.4 13.0 - 17.7 g/dL   Hematocrit 49.4 37.5 - 51.0 %   MCV 89 79 - 97 fL   MCH 31.3 26.6 - 33.0 pg   MCHC 35.2 31.5 - 35.7  g/dL   RDW 14.2 11.6 - 15.4 %   Platelets 228 150 - 450 x10E3/uL   Neutrophils 49 Not Estab. %   Lymphs 41 Not Estab. %   MID 10 Not Estab. %   Neutrophils Absolute 2.3 1.4 - 7.0 x10E3/uL   Lymphocytes Absolute 2.0 0.7 - 3.1 x10E3/uL   MID (Absolute) 0.5 0.1 - 1.6 X10E3/uL  Glucose Hemocue Waived  Result Value Ref Range   Glu Hemocue Waived 110 (H) 65 - 99 mg/dL  HgB A1c  Result Value Ref Range   Hgb A1c MFr Bld 6.2 (H) 4.8 - 5.6 %   Est. average glucose Bld gHb Est-mCnc 131 mg/dL      Assessment & Plan:   Problem List Items Addressed This Visit    None    Visit Diagnoses    Dizziness    -  Primary   POC glucose, vitals, EKG, exam benign today. Await A1C given diet changes. Suspect vertigo though. Meclizine prn, epley maneuvers reviewed. F/u if not better   Relevant Orders   CBC With Differential/Platelet (Completed)   Glucose Hemocue Waived (Completed)   EKG 12-Lead (Completed)   HgB A1c (Completed)      25 minutes spent today in direct patient  evaluation and counseling  Follow up plan: Return for as scheduled.

## 2019-06-02 LAB — CBC WITH DIFFERENTIAL/PLATELET
Hematocrit: 49.4 % (ref 37.5–51.0)
Hemoglobin: 17.4 g/dL (ref 13.0–17.7)
Lymphocytes Absolute: 2 10*3/uL (ref 0.7–3.1)
Lymphs: 41 %
MCH: 31.3 pg (ref 26.6–33.0)
MCHC: 35.2 g/dL (ref 31.5–35.7)
MCV: 89 fL (ref 79–97)
MID (Absolute): 0.5 10*3/uL (ref 0.1–1.6)
MID: 10 %
Neutrophils Absolute: 2.3 10*3/uL (ref 1.4–7.0)
Neutrophils: 49 %
Platelets: 228 10*3/uL (ref 150–450)
RBC: 5.56 x10E6/uL (ref 4.14–5.80)
RDW: 14.2 % (ref 11.6–15.4)
WBC: 4.8 10*3/uL (ref 3.4–10.8)

## 2019-06-02 LAB — HEMOGLOBIN A1C
Est. average glucose Bld gHb Est-mCnc: 131 mg/dL
Hgb A1c MFr Bld: 6.2 % — ABNORMAL HIGH (ref 4.8–5.6)

## 2019-06-02 LAB — GLUCOSE HEMOCUE WAIVED: Glu Hemocue Waived: 110 mg/dL — ABNORMAL HIGH (ref 65–99)

## 2019-06-04 ENCOUNTER — Other Ambulatory Visit: Payer: Self-pay | Admitting: Family Medicine

## 2019-06-04 NOTE — Telephone Encounter (Signed)
Medication: strips and  lancets  Has the patient contacted their pharmacy? Yes  (Agent: If no, request that the patient contact the pharmacy for the refill.) (Agent: If yes, when and what did the pharmacy advise?)  Preferred Pharmacy (with phone number or street name.[facility] [place], [place] AT [facility] OF [address]  Phone:  [phone] Fax:  [phone]     Agent: Please be advised that RX refills may take up to 3 business days. We ask that you follow-up with your pharmacy.

## 2019-06-04 NOTE — Telephone Encounter (Signed)
Already faxed to the pharmacy.

## 2019-06-07 ENCOUNTER — Ambulatory Visit (INDEPENDENT_AMBULATORY_CARE_PROVIDER_SITE_OTHER): Payer: Medicare HMO

## 2019-06-07 VITALS — Wt 195.0 lb

## 2019-06-07 DIAGNOSIS — Z0000 Encounter for general adult medical examination without abnormal findings: Secondary | ICD-10-CM

## 2019-06-07 NOTE — Patient Instructions (Addendum)
Mr. George Glenn , Thank you for taking time to come for your Medicare Wellness Visit. I appreciate your ongoing commitment to your health goals. Please review the following plan we discussed and let me know if I can assist you in the future.   Screening recommendations/referrals: Colonoscopy: due now declined  Recommended yearly ophthalmology/optometry visit for glaucoma screening and checkup Recommended yearly dental visit for hygiene and checkup  Vaccinations: Influenza vaccine: up to date  Pneumococcal vaccine: up to date  Tdap vaccine: up to date  Shingles vaccine: shingrix eligible     Advanced directives: legal guardian  Conditions/risks identified: diabetic. Please schedule diabetic eye exam.   Next appointment: Follow up in one year for your annual wellness visit  Preventive Care 40-64 Years, Male Preventive care refers to lifestyle choices and visits with your health care provider that can promote health and wellness. What does preventive care include?  A yearly physical exam. This is also called an annual well check.  Dental exams once or twice a year.  Routine eye exams. Ask your health care provider how often you should have your eyes checked.  Personal lifestyle choices, including:  Daily care of your teeth and gums.  Regular physical activity.  Eating a healthy diet.  Avoiding tobacco and drug use.  Limiting alcohol use.  Practicing safe sex.  Taking low-dose aspirin every day starting at age 49. What happens during an annual well check? The services and screenings done by your health care provider during your annual well check will depend on your age, overall health, lifestyle risk factors, and family history of disease. Counseling  Your health care provider may ask you questions about your:  Alcohol use.  Tobacco use.  Drug use.  Emotional well-being.  Home and relationship well-being.  Sexual activity.  Eating habits.  Work and work  Statistician. Screening  You may have the following tests or measurements:  Height, weight, and BMI.  Blood pressure.  Lipid and cholesterol levels. These may be checked every 5 years, or more frequently if you are over 22 years old.  Skin check.  Lung cancer screening. You may have this screening every year starting at age 39 if you have a 30-pack-year history of smoking and currently smoke or have quit within the past 15 years.  Fecal occult blood test (FOBT) of the stool. You may have this test every year starting at age 48.  Flexible sigmoidoscopy or colonoscopy. You may have a sigmoidoscopy every 5 years or a colonoscopy every 10 years starting at age 78.  Prostate cancer screening. Recommendations will vary depending on your family history and other risks.  Hepatitis C blood test.  Hepatitis B blood test.  Sexually transmitted disease (STD) testing.  Diabetes screening. This is done by checking your blood sugar (glucose) after you have not eaten for a while (fasting). You may have this done every 1-3 years. Discuss your test results, treatment options, and if necessary, the need for more tests with your health care provider. Vaccines  Your health care provider may recommend certain vaccines, such as:  Influenza vaccine. This is recommended every year.  Tetanus, diphtheria, and acellular pertussis (Tdap, Td) vaccine. You may need a Td booster every 10 years.  Zoster vaccine. You may need this after age 84.  Pneumococcal 13-valent conjugate (PCV13) vaccine. You may need this if you have certain conditions and have not been vaccinated.  Pneumococcal polysaccharide (PPSV23) vaccine. You may need one or two doses if you smoke cigarettes or  if you have certain conditions. Talk to your health care provider about which screenings and vaccines you need and how often you need them. This information is not intended to replace advice given to you by your health care provider. Make  sure you discuss any questions you have with your health care provider. Document Released: 05/02/2015 Document Revised: 12/24/2015 Document Reviewed: 02/04/2015 Elsevier Interactive Patient Education  2017 [place] Prevention in the Home Falls can cause injuries. They can happen to people of all ages. There are many things you can do to make your home safe and to help prevent falls. What can I do on the outside of my home?  Regularly fix the edges of walkways and driveways and fix any cracks.  Remove anything that might make you trip as you walk through a door, such as a raised step or threshold.  Trim any bushes or trees on the path to your home.  Use bright outdoor lighting.  Clear any walking paths of anything that might make someone trip, such as rocks or tools.  Regularly check to see if handrails are loose or broken. Make sure that both sides of any steps have handrails.  Any raised decks and porches should have guardrails on the edges.  Have any leaves, snow, or ice cleared regularly.  Use sand or salt on walking paths during winter.  Clean up any spills in your garage right away. This includes oil or grease spills. What can I do in the bathroom?  Use night lights.  Install grab bars by the toilet and in the tub and shower. Do not use towel bars as grab bars.  Use non-skid mats or decals in the tub or shower.  If you need to sit down in the shower, use a plastic, non-slip stool.  Keep the floor dry. Clean up any water that spills on the floor as soon as it happens.  Remove soap buildup in the tub or shower regularly.  Attach bath mats securely with double-sided non-slip rug tape.  Do not have throw rugs and other things on the floor that can make you trip. What can I do in the bedroom?  Use night lights.  Make sure that you have a light by your bed that is easy to reach.  Do not use any sheets or blankets that are too big for your bed. They should  not hang down onto the floor.  Have a firm chair that has side arms. You can use this for support while you get dressed.  Do not have throw rugs and other things on the floor that can make you trip. What can I do in the kitchen?  Clean up any spills right away.  Avoid walking on wet floors.  Keep items that you use a lot in easy-to-reach places.  If you need to reach something above you, use a strong step stool that has a grab bar.  Keep electrical cords out of the way.  Do not use floor polish or wax that makes floors slippery. If you must use wax, use non-skid floor wax.  Do not have throw rugs and other things on the floor that can make you trip. What can I do with my stairs?  Do not leave any items on the stairs.  Make sure that there are handrails on both sides of the stairs and use them. Fix handrails that are broken or loose. Make sure that handrails are as long as the stairways.  Check  any carpeting to make sure that it is firmly attached to the stairs. Fix any carpet that is loose or worn.  Avoid having throw rugs at the top or bottom of the stairs. If you do have throw rugs, attach them to the floor with carpet tape.  Make sure that you have a light switch at the top of the stairs and the bottom of the stairs. If you do not have them, ask someone to add them for you. What else can I do to help prevent falls?  Wear shoes that:  Do not have high heels.  Have rubber bottoms.  Are comfortable and fit you well.  Are closed at the toe. Do not wear sandals.  If you use a stepladder:  Make sure that it is fully opened. Do not climb a closed stepladder.  Make sure that both sides of the stepladder are locked into place.  Ask someone to hold it for you, if possible.  Clearly mark and make sure that you can see:  Any grab bars or handrails.  First and last steps.  Where the edge of each step is.  Use tools that help you move around (mobility aids) if they are  needed. These include:  Canes.  Walkers.  Scooters.  Crutches.  Turn on the lights when you go into a dark area. Replace any light bulbs as soon as they burn out.  Set up your furniture so you have a clear path. Avoid moving your furniture around.  If any of your floors are uneven, fix them.  If there are any pets around you, be aware of where they are.  Review your medicines with your doctor. Some medicines can make you feel dizzy. This can increase your chance of falling. Ask your doctor what other things that you can do to help prevent falls. This information is not intended to replace advice given to you by your health care provider. Make sure you discuss any questions you have with your health care provider. Document Released: 01/30/2009 Document Revised: 09/11/2015 Document Reviewed: 05/10/2014 Elsevier Interactive Patient Education  2017 [employer].

## 2019-06-07 NOTE — Progress Notes (Signed)
Subjective:   George Glenn is a 59 y.o. male who presents for Medicare Annual/Subsequent preventive examination.  This visit is being conducted via phone call  - after an attmept to do on video chat - due to the COVID-19 pandemic. This patient has given me verbal consent via phone to conduct this visit, patient states they are participating from their home address. Some vital signs may be absent or patient reported.   Patient identification: identified by name, DOB, and current address.   [name], patients legal guardian, helped with todays visit.  Review of Systems:         Objective:    Vitals: Wt 195 lb (88.5 kg)   BMI 27.58 kg/m   Body mass index is 27.58 kg/m.  Advanced Directives 06/08/2016 03/19/2016 03/16/2016 12/10/2014  Does Patient Have a Medical Advance Directive? Yes Yes Yes Yes  Type of Paramedic of [place];Living will Healthcare Power of [place]  Does patient want to make changes to medical advance directive? - No - Patient declined - No - Patient declined  Copy of [place] in Chart? - (No Data) No - copy requested No - copy requested  Would patient like information on creating a medical advance directive? - - - Yes - Scientist, clinical (histocompatibility and immunogenetics) given    Tobacco Social History   Tobacco Use  Smoking Status Never Smoker  Smokeless Tobacco Never Used     Counseling given: Not Answered   Clinical Intake:  Pre-visit preparation completed: Yes  Pain : No/denies pain     Nutritional Risks: None Diabetes: No  How often do you need to have someone help you when you read instructions, pamphlets, or other written materials from your doctor or pharmacy?: 1 - Never  Interpreter Needed?: No  Information entered by :: Konya Fauble,LPN  Past Medical History:  Diagnosis Date  . Anemia   . Barrett's esophagus   . Brain damage   . Chronic kidney  disease   . Diabetes mellitus without complication [facility])    sister states he is no longer diabetic due to diet changes and weight loss  . Dizziness    WHEN STANDING  . GERD (gastroesophageal reflux disease)    Barrett's  . Hyperlipidemia   . Hypertension   . Hypogonadism in male   . Memory loss, short term   . Osteoporosis   . Paranoid schizophrenia ([place])   . Tic    HANDS   Past Surgical History:  Procedure Laterality Date  . CATARACT EXTRACTION W/PHACO Right 11/12/2014   Procedure: CATARACT EXTRACTION PHACO AND INTRAOCULAR LENS PLACEMENT (IOC);  Surgeon: Birder Robson, MD;  Location: [facility] ORS;  Service: Ophthalmology;  Laterality: Right;  cassette Lot # [id number]     [place]  00:45 AP 14.7 CDE 6.62  . CATARACT EXTRACTION W/PHACO Left 12/10/2014   Procedure: CATARACT EXTRACTION PHACO AND INTRAOCULAR LENS PLACEMENT (IOC);  Surgeon: Birder Robson, MD;  Location: [facility] ORS;  Service: Ophthalmology;  Laterality: Left;  US:00:46.1 AP:20.0 CDE:9.22 LOT PACK [id number]  . COLONOSCOPY    . ESOPHAGOGASTRODUODENOSCOPY (EGD) WITH PROPOFOL N/A 04/23/2016   Procedure: ESOPHAGOGASTRODUODENOSCOPY (EGD) WITH PROPOFOL;  Surgeon: [name], MD;  Location: [facility] ENDOSCOPY;  Service: Endoscopy;  Laterality: N/A;   Family History  Problem Relation Age of Onset  . Hypertension Father   . Diabetes Father   . Heart disease Father   . Hyperlipidemia Father   . Cancer Mother  Skin  . Dementia Mother   . Diabetes Brother   . Stroke Maternal Grandmother   . Stroke Maternal Grandfather   . Heart disease Paternal Grandmother   . Heart disease Paternal Grandfather    Social History   Socioeconomic History  . Marital status: Single    Spouse name: Not on file  . Number of children: Not on file  . Years of education: Not on file  . Highest education level: Not on file  Occupational History  . Occupation: disability   Tobacco Use  . Smoking status: Never Smoker  . Smokeless tobacco: Never Used    Substance and Sexual Activity  . Alcohol use: No    Alcohol/week: 0.0 standard drinks  . Drug use: No  . Sexual activity: Never  Other Topics Concern  . Not on file  Social History Narrative   Sister is legal guardian    Social Determinants of Health   Financial Resource Strain:   . Difficulty of Paying Living Expenses: Not on file  Food Insecurity:   . Worried About Charity fundraiser in the Last Year: Not on file  . Ran Out of Food in the Last Year: Not on file  Transportation Needs:   . Lack of Transportation (Medical): Not on file  . Lack of Transportation (Non-Medical): Not on file  Physical Activity:   . Days of Exercise per Week: Not on file  . Minutes of Exercise per Session: Not on file  Stress:   . Feeling of Stress : Not on file  Social Connections:   . Frequency of Communication with Friends and Family: Not on file  . Frequency of Social Gatherings with Friends and Family: Not on file  . Attends Religious Services: Not on file  . Active Member of Clubs or Organizations: Not on file  . Attends Archivist Meetings: Not on file  . Marital Status: Not on file    Outpatient Encounter Medications as of 06/07/2019  Medication Sig  . Cyanocobalamin (VITAMIN B 12 PO) Take 1 tablet by mouth daily.   Mariane Baumgarten Calcium (STOOL SOFTENER PO) Take 1 capsule by mouth daily as needed.  Marland Kitchen esomeprazole (NEXIUM) 40 MG capsule Take 1 capsule (40 mg total) by mouth daily.  Marland Kitchen LORazepam (ATIVAN) 0.5 MG tablet TAKE 1 TABLET(0.5 MG) BY MOUTH AT BEDTIME  . meclizine (ANTIVERT) 25 MG tablet Take 1 tablet (25 mg total) by mouth 3 (three) times daily as needed for dizziness.  . simvastatin (ZOCOR) 10 MG tablet Take 1 tablet (10 mg total) by mouth daily.   No facility-administered encounter medications on file as of 06/07/2019.    Activities of Daily Living In your present state of health, do you have any difficulty performing the following activities: 06/07/2019 02/07/2019   Hearing? N N  Comment no hearing aids -  Vision? Tempie Donning  Comment goes to  eye center, needs glasses but eye dr cant get read. -  Difficulty concentrating or making decisions? Y N  Walking or climbing stairs? N Y  Dressing or bathing? Tempie Donning  Comment has aid that helps -  Doing errands, shopping? Tempie Donning  Comment sister helps -  Conservation officer, nature and eating ? Y -  Comment sister does -  Using the Toilet? N -  In the past six months, have you accidently leaked urine? N -  Do you have problems with loss of bowel control? N -  Managing your Medications? Y -  Comment sister  helps -  Managing your Finances? Y -  Comment sister helps -  Housekeeping or managing your Housekeeping? Y -  Comment sister helps -  Some recent data might be hidden    Patient Care Team: Volney American, PA-C as PCP - General (Family Medicine) Anabel Bene, MD as Referring Physician (Neurology) Teodoro Spray, MD as Consulting Physician (Cardiology)   Assessment:   This is a routine wellness examination for [place].  Exercise Activities and Dietary recommendations    Goals    . DIET - INCREASE WATER INTAKE     Recommend drinking at least 6-8 glasses of water a day        Fall Risk: Fall Risk  06/07/2019 02/07/2019 07/14/2017 06/08/2016 03/16/2016  Falls in the past year? 0 0 No No No  Number falls in past yr: 0 0 - - -  Injury with Fall? 0 0 - - -  Risk for fall due to : - - - - Mental status change  Follow up - Falls evaluation completed - - -    FALL RISK PREVENTION PERTAINING TO THE HOME:  Any stairs in or around the home? 2 steps on  on front porch  If so, are there any without handrails?   Home free of loose throw rugs in walkways, pet beds, electrical cords, etc? Yes  Adequate lighting in your home to reduce risk of falls? Yes   ASSISTIVE DEVICES UTILIZED TO PREVENT FALLS:  Life alert? No  Use of a cane, walker or w/c? No  Grab bars in the bathroom? No  Shower chair or bench  in shower? No  Elevated toilet seat or a handicapped toilet? No   TIMED UP AND GO:  Unable to perform   Depression Screen PHQ 2/9 Scores 06/07/2019 02/07/2019 07/14/2017 06/08/2016  PHQ - 2 Score 0 0 0 0  Exception Documentation - - - -  Not completed - - - -    Cognitive Function        Immunization History  Administered Date(s) Administered  . Influenza,inj,Quad PF,6+ Mos 03/08/2016, 03/21/2018, 02/07/2019  . Pneumococcal Polysaccharide-23 03/22/2011    Qualifies for Shingles Vaccine? Not eligible   Tdap: Discussed need for TD/TDAP vaccine, patient verbalized understanding that this is not covered as a preventative with there insurance and to call the office if he develops any new skin injuries, ie: cuts, scrapes, bug bites, or open wounds.  Flu Vaccine: up to date   Pneumococcal Vaccine: up to date    Screening Tests Health Maintenance  Topic Date Due  . OPHTHALMOLOGY EXAM  09/11/2015  . FOOT EXAM  06/08/2017  . COLONOSCOPY  10/29/2017  . URINE MICROALBUMIN  11/26/2018  . TETANUS/TDAP  02/07/2020 (Originally 10/17/2012)  . HEMOGLOBIN A1C  11/29/2019  . INFLUENZA VACCINE  Completed  . PNEUMOCOCCAL POLYSACCHARIDE VACCINE AGE 40-64 HIGH RISK  Completed  . Hepatitis C Screening  Completed  . HIV Screening  Completed   Cancer Screenings:  Colorectal Screening: Completed 2009. Repeat every 10 years; declined this year due to pandemic.   Lung Cancer Screening: (Low Dose CT Chest recommended if Age 47-80 years, 30 pack-year currently smoking OR have quit w/in 15years.) does not qualify.    Additional Screening:  Hepatitis C Screening: does qualify; Completed 2017  Vision Screening: Recommended annual ophthalmology exams for early detection of glaucoma and other disorders of the eye. Is the patient up to date with their annual eye exam?  Yes  Who is the provider  or what is the name of the office in which the pt attends annual eye exams? [facility]   Dental  Screening: Recommended annual dental exams for proper oral hygiene  Community Resource Referral:  CRR required this visit?  No        Plan:  I have personally reviewed and addressed the Medicare Annual Wellness questionnaire and have noted the following in the patient's chart:  A. Medical and social history B. Use of alcohol, tobacco or illicit drugs  C. Current medications and supplements D. Functional ability and status E.  Nutritional status F.  Physical activity G. Advance directives H. List of other physicians I.  Hospitalizations, surgeries, and ER visits in previous 12 months J.  [place] such as hearing and vision if needed, cognitive and depression L. Referrals and appointments   In addition, I have reviewed and discussed with patient certain preventive protocols, quality metrics, and best practice recommendations. A written personalized care plan for preventive services as well as general preventive health recommendations were provided to patient.   Signed,   Bevelyn Ngo, LPN  [id number] Nurse Health Advisor   Nurse Notes: due for diabetic foot exam.

## 2019-08-13 ENCOUNTER — Other Ambulatory Visit: Payer: Self-pay | Admitting: Family Medicine

## 2019-08-13 NOTE — Telephone Encounter (Signed)
Requested Prescriptions  Pending Prescriptions Disp Refills  . simvastatin (ZOCOR) 10 MG tablet [Pharmacy Med Name: SIMVASTATIN 10MG  TABLETS] 90 tablet 1    Sig: TAKE 1 TABLET(10 MG) BY MOUTH DAILY     Cardiovascular:  Antilipid - Statins Failed - 08/13/2019  2:28 PM      Failed - LDL in normal range and within 360 days    LDL Chol Calc (NIH)  Date Value Ref Range Status  02/07/2019 85 0 - 99 mg/dL Final         Failed - HDL in normal range and within 360 days    HDL  Date Value Ref Range Status  02/07/2019 28 (L) >39 mg/dL Final         Passed - Total Cholesterol in normal range and within 360 days    Cholesterol, Total  Date Value Ref Range Status  02/07/2019 127 100 - 199 mg/dL Final         Passed - Triglycerides in normal range and within 360 days    Triglycerides  Date Value Ref Range Status  02/07/2019 65 0 - 149 mg/dL Final         Passed - Patient is not pregnant      Passed - Valid encounter within last 12 months    Recent Outpatient Visits          2 months ago Dizziness   [place], [place], [place]   6 months ago IFG (impaired fasting glucose)   [facility], [place], [place]   1 year ago Mixed hyperlipidemia   [place], [place], [place]   1 year ago Paranoid schizophrenia [facility])   [place], [place], [place]   1 year ago Type 2 diabetes mellitus without complication, without long-term current use of insulin [facility])   [place], [place], [place]      Future Appointments            In 10 months [facility], [facility]             Lipids checked on 01/2019 and noted to be stable.

## 2019-09-07 ENCOUNTER — Other Ambulatory Visit: Payer: Self-pay | Admitting: Family Medicine

## 2019-09-07 MED ORDER — ESOMEPRAZOLE MAGNESIUM 40 MG PO CPDR: 40.0000 mg | DELAYED_RELEASE_CAPSULE | Freq: Every day | ORAL | 1 refills | Status: DC

## 2019-09-07 NOTE — Telephone Encounter (Signed)
Routing to provider. Patient last seen 06/01/19

## 2019-09-07 NOTE — Telephone Encounter (Signed)
George Glenn calling on behalf of patient stating there has been an issue refilling patients esomeprazole (NEXIUM) 40 MG capsule. [facility] stated to patient they sent the refill again. [facility] [place], [place] AT [facility] OF [address] [facility] Phone:  [phone]  Fax:  [phone]

## 2019-09-13 ENCOUNTER — Other Ambulatory Visit: Payer: Self-pay | Admitting: Family Medicine

## 2019-09-13 NOTE — Telephone Encounter (Signed)
Requested medication (s) are due for refill today -yes  Requested medication (s) are on the active medication list -yes  Future visit scheduled -yes  Last refill: 02/22/2019 5 RF  Notes to clinic: Request for RF of non delegated Rx- patient request 90 day supply on RF so family member does not have to go to pharmacy so often  Requested Prescriptions  Pending Prescriptions Disp Refills   LORazepam (ATIVAN) 0.5 MG tablet 30 tablet 5    Sig: TAKE 1 TABLET(0.5 MG) BY MOUTH AT BEDTIME      Not Delegated - Psychiatry:  Anxiolytics/Hypnotics Failed - 09/13/2019  9:37 AM      Failed - This refill cannot be delegated      Failed - Urine Drug Screen completed in last 360 days.      Passed - Valid encounter within last 6 months    Recent Outpatient Visits           3 months ago [place], [place], [place]   7 months ago IFG (impaired fasting glucose)   [facility], [place], [place]   1 year ago Mixed hyperlipidemia   [facility] [place] Roof [place], [place]   1 year ago Paranoid schizophrenia [facility])   [place], [place], [place]   1 year ago Type 2 diabetes mellitus without complication, without long-term current use of insulin [facility])   [place], [place], [place]       Future Appointments             In 9 months [place], [facility]                 Requested Prescriptions  Pending Prescriptions Disp Refills   LORazepam (ATIVAN) 0.5 MG tablet 30 tablet 5    Sig: TAKE 1 TABLET(0.5 MG) BY MOUTH AT BEDTIME      Not Delegated - Psychiatry:  Anxiolytics/Hypnotics Failed - 09/13/2019  9:37 AM      Failed - This refill cannot be delegated      Failed - Urine Drug Screen completed in last 360 days.      Passed - Valid encounter within last 6 months    Recent Outpatient Visits           3 months ago Dizziness   [facility] Mer[place] Roof [place], [place]   7 months ago IFG (impaired fasting glucose)   [facility] [place], [place]   1 year ago Mixed hyperlipidemia   [facility] [place] Roof [place], [place]   1 year ago Paranoid schizophrenia [facility])   [facility] Mer[place] Roof [place], [place]   1 year ago Type 2 diabetes mellitus without complication, without long-term current use of insulin [facility] )   [facility], [place], [place]       Future Appointments             In 9 months [facility], [place]

## 2019-09-13 NOTE — Telephone Encounter (Signed)
Routing to provider  

## 2019-09-13 NOTE — Telephone Encounter (Signed)
Sister calling on behalf of the pt to request a refill of LORazepam (ATIVAN) 0.5 MG tablet 90 day  she prefers 90 to not have to go to pharmacy every month.  [facility] STORE [place], [place] AT [facility] [address] [facility] Phone:  [phone]  Fax:  [phone]     Pt has called the pharmacy several times and they advised her to call us.

## 2019-09-14 MED ORDER — LORAZEPAM 0.5 MG PO TABS: ORAL_TABLET | ORAL | 5 refills | Status: DC

## 2019-09-14 NOTE — Telephone Encounter (Signed)
Sister calling back as she said she missed a call from the office. Advised someone will reach back out to her.

## 2019-09-14 NOTE — Telephone Encounter (Signed)
[name] pt's sister called in wants to make sure that pt has lorazepam filled due to having one pill left, pt is scheduled for an appt on 09/18/19

## 2019-09-14 NOTE — Telephone Encounter (Signed)
Called pt's sister wilma to let her know rx has been sent to the pharmacy, no answer, left vm to call back

## 2019-09-14 NOTE — Telephone Encounter (Signed)
Rx sent 

## 2019-09-14 NOTE — Telephone Encounter (Signed)
Medication: LORazepam (ATIVAN) 0.5 MG tablet [id number] - Apt scheduled 09/18/19   Has the patient contacted their pharmacy? Yes (Agent: If no, request that the patient contact the pharmacy for the refill.) (Agent: If yes, when and what did the pharmacy advise?)  Preferred Pharmacy (with phone number or street name): [facility] STORE [place], [place] - [place] AT [place]  Phone:  [phone] Fax:  [phone]     Agent: Please be advised that RX refills may take up to 3 business days. We ask that you follow-up with your pharmacy.

## 2019-09-14 NOTE — Telephone Encounter (Signed)
Sister [name] aware Rx is at the pharmacy.

## 2019-09-14 NOTE — Telephone Encounter (Signed)
Routing to provider  

## 2019-09-18 ENCOUNTER — Encounter: Payer: Self-pay | Admitting: Family Medicine

## 2019-09-18 ENCOUNTER — Ambulatory Visit (INDEPENDENT_AMBULATORY_CARE_PROVIDER_SITE_OTHER): Payer: Medicare HMO | Admitting: Family Medicine

## 2019-09-18 VITALS — BP 138/90 | HR 67 | Temp 98.2°F | Wt 193.0 lb

## 2019-09-18 DIAGNOSIS — R42 Dizziness and giddiness: Secondary | ICD-10-CM | POA: Diagnosis not present

## 2019-09-18 MED ORDER — MECLIZINE HCL 25 MG PO TABS: 25.0000 mg | ORAL_TABLET | Freq: Three times a day (TID) | ORAL | 1 refills | Status: DC | PRN

## 2019-09-18 NOTE — Progress Notes (Addendum)
BP 138/90   Pulse 67   Temp 98.2 F (36.8 C) (Oral)   Wt 193 lb (87.5 kg)   SpO2 94%   BMI 27.30 kg/m    Subjective:    Patient ID: George Glenn, male    DOB: [birth year], 60 y.o.   MRN: [id number]  HPI: George Glenn is a 59 y.o. male  Chief Complaint  Patient presents with  . Anxiety  . Hyperlipidemia  . Dizziness  . Medication Refill   Still dealing with the spinning episodes, dizzy often especially with sitting down at the table to eat but seems to be sporadic and only relieved by laying down in the bed. Sister who is his caregiver and provides most of the history notes when he had the meclizine around this did seem to help quite a bit with sxs. Denies N/V, CP, SOB, syncope.   Relevant past medical, surgical, family and social history reviewed and updated as indicated. Interim medical history since our last visit reviewed. Allergies and medications reviewed and updated.  Review of Systems  Per HPI unless specifically indicated above     Objective:    BP 138/90   Pulse 67   Temp 98.2 F (36.8 C) (Oral)   Wt 193 lb (87.5 kg)   SpO2 94%   BMI 27.30 kg/m   Wt Readings from Last 3 Encounters:  10/16/19 189 lb 9.6 oz (86 kg)  09/18/19 193 lb (87.5 kg)  06/07/19 195 lb (88.5 kg)    Physical Exam Vitals and nursing note reviewed.  Constitutional:      Appearance: Normal appearance.  HENT:     Head: Atraumatic.  Eyes:     Extraocular Movements: Extraocular movements intact.     Conjunctiva/sclera: Conjunctivae normal.  Cardiovascular:     Rate and Rhythm: Normal rate and regular rhythm.  Pulmonary:     Effort: Pulmonary effort is normal.     Breath sounds: Normal breath sounds.  Musculoskeletal:        General: Normal range of motion.     Cervical back: Normal range of motion and neck supple.  Skin:    General: Skin is warm and dry.  Neurological:     Mental Status: Mental status is at baseline.  Psychiatric:        Mood and Affect: Mood  normal.        Thought Content: Thought content normal.        Judgment: Judgment normal.     Results for orders placed or performed in visit on 06/01/19  CBC With Differential/Platelet  Result Value Ref Range   WBC 4.8 3.4 - 10.8 x10E3/uL   RBC 5.56 4.14 - 5.80 x10E6/uL   Hemoglobin 17.4 13.0 - 17.7 g/dL   Hematocrit 49.4 37.5 - 51.0 %   MCV 89 79 - 97 fL   MCH 31.3 26.6 - 33.0 pg   MCHC 35.2 31 - 35 g/dL   RDW 14.2 11.6 - 15.4 %   Platelets 228 150 - 450 x10E3/uL   Neutrophils 49 Not Estab. %   Lymphs 41 Not Estab. %   MID 10 Not Estab. %   Neutrophils Absolute 2.3 1 - 7 x10E3/uL   Lymphocytes Absolute 2.0 0 - 3 x10E3/uL   MID (Absolute) 0.5 0.1 - 1.6 X10E3/uL  Glucose Hemocue Waived  Result Value Ref Range   Glu Hemocue Waived 110 (H) 65 - 99 mg/dL  HgB A1c  Result Value Ref Range   Hgb A1c MFr Bld  6.2 (H) 4.8 - 5.6 %   Est. average glucose Bld gHb Est-mCnc 131 mg/dL      Assessment & Plan:   Problem List Items Addressed This Visit      Other   Vertigo - Primary    Restart prn meclizine, epley maneuvers. Will refer to ENT if not resolving in next few weeks          Follow up plan: Return in about 4 weeks (around 10/16/2019) for dizziness f/u, 6 month f/u.

## 2019-10-16 ENCOUNTER — Other Ambulatory Visit: Payer: Self-pay

## 2019-10-16 ENCOUNTER — Ambulatory Visit (INDEPENDENT_AMBULATORY_CARE_PROVIDER_SITE_OTHER): Payer: Medicare HMO | Admitting: Family Medicine

## 2019-10-16 ENCOUNTER — Encounter: Payer: Self-pay | Admitting: Family Medicine

## 2019-10-16 VITALS — BP 123/69 | HR 70 | Temp 98.2°F | Ht 69.5 in | Wt 189.6 lb

## 2019-10-16 DIAGNOSIS — R42 Dizziness and giddiness: Secondary | ICD-10-CM | POA: Diagnosis not present

## 2019-10-16 NOTE — Progress Notes (Signed)
BP 123/69   Pulse 70   Temp 98.2 F (36.8 C) (Oral)   Ht 5' 9.5" (1.765 m)   Wt 189 lb 9.6 oz (86 kg)   SpO2 94%   BMI 27.60 kg/m    Subjective:    Patient ID: George Glenn, male    DOB: [birth year], 59 y.o.   MRN: [id number]  HPI: George Glenn is a 59 y.o. male  Chief Complaint  Patient presents with  . Dizziness    4 week f/up- still has some dizziness but not as bad as it was, sister states that the medication has helped   Here today with sister who is primary caregiver and provides nearly all of the history for 1 month vertigo f/u. Still having 3-4 spells per week but used to be daily and now they are brief episodes. Medication working well as needed. Denies nausea, vomiting, headaches, falls.   Relevant past medical, surgical, family and social history reviewed and updated as indicated. Interim medical history since our last visit reviewed. Allergies and medications reviewed and updated.  Review of Systems  Per HPI unless specifically indicated above     Objective:    BP 123/69   Pulse 70   Temp 98.2 F (36.8 C) (Oral)   Ht 5' 9.5" (1.765 m)   Wt 189 lb 9.6 oz (86 kg)   SpO2 94%   BMI 27.60 kg/m   Wt Readings from Last 3 Encounters:  10/16/19 189 lb 9.6 oz (86 kg)  09/18/19 193 lb (87.5 kg)  06/07/19 195 lb (88.5 kg)    Physical Exam Vitals and nursing note reviewed.  Constitutional:      Appearance: Normal appearance.  HENT:     Head: Atraumatic.  Eyes:     Extraocular Movements: Extraocular movements intact.     Conjunctiva/sclera: Conjunctivae normal.  Cardiovascular:     Rate and Rhythm: Normal rate and regular rhythm.  Pulmonary:     Effort: Pulmonary effort is normal.     Breath sounds: Normal breath sounds.  Musculoskeletal:        General: Normal range of motion.     Cervical back: Normal range of motion and neck supple.  Skin:    General: Skin is warm and dry.  Neurological:     Mental Status: Mental status is at baseline.    Psychiatric:     Comments: At baseline, cooperative     Results for orders placed or performed in visit on 06/01/19  CBC With Differential/Platelet  Result Value Ref Range   WBC 4.8 3.4 - 10.8 x10E3/uL   RBC 5.56 4.14 - 5.80 x10E6/uL   Hemoglobin 17.4 13.0 - 17.7 g/dL   Hematocrit 49.4 37.5 - 51.0 %   MCV 89 79 - 97 fL   MCH 31.3 26.6 - 33.0 pg   MCHC 35.2 31 - 35 g/dL   RDW 14.2 11.6 - 15.4 %   Platelets 228 150 - 450 x10E3/uL   Neutrophils 49 Not Estab. %   Lymphs 41 Not Estab. %   MID 10 Not Estab. %   Neutrophils Absolute 2.3 1 - 7 x10E3/uL   Lymphocytes Absolute 2.0 0 - 3 x10E3/uL   MID (Absolute) 0.5 0.1 - 1.6 X10E3/uL  Glucose Hemocue Waived  Result Value Ref Range   Glu Hemocue Waived 110 (H) 65 - 99 mg/dL  HgB A1c  Result Value Ref Range   Hgb A1c MFr Bld 6.2 (H) 4.8 - 5.6 %   Est. average  glucose Bld gHb Est-mCnc 131 mg/dL      Assessment & Plan:   Problem List Items Addressed This Visit      Other   Vertigo - Primary    Improving per sister who is primary caregiver. Declines referral to vestibular PT at this time and wanting to continue current regimen. Will call back if becoming more frequent or troublesome          Follow up plan: Return for as scheduled.

## 2019-10-16 NOTE — Assessment & Plan Note (Signed)
Improving per sister who is primary caregiver. Declines referral to vestibular PT at this time and wanting to continue current regimen. Will call back if becoming more frequent or troublesome

## 2019-10-16 NOTE — Assessment & Plan Note (Signed)
Restart prn meclizine, epley maneuvers. Will refer to ENT if not resolving in next few weeks

## 2019-11-25 ENCOUNTER — Other Ambulatory Visit: Payer: Self-pay | Admitting: Family Medicine

## 2019-11-25 NOTE — Telephone Encounter (Signed)
Requested medication (s) are due for refill today: yes  Requested medication (s) are on the active medication list: yes  Last refill:  09/18/19  Future visit scheduled: yes  Notes to clinic:  med not delegated to NT to RF   Requested Prescriptions  Pending Prescriptions Disp Refills   meclizine (ANTIVERT) 25 MG tablet [Pharmacy Med Name: MECLIZINE 25MG  RX TABLETS] 30 tablet 1    Sig: TAKE 1 TABLET(25 MG) BY MOUTH THREE TIMES DAILY AS NEEDED FOR DIZZINESS      Not Delegated - Gastroenterology: Antiemetics Failed - 11/25/2019  2:58 PM      Failed - This refill cannot be delegated      Passed - Valid encounter within last 6 months    Recent Outpatient Visits           1 month ago [place], [place], [place]   2 months ago [place], [place]   5 months ago [place], [place], [place]   9 months ago IFG (impaired fasting glucose)   [facility], Lilia [place]ue, [place]   1 year ago Mixed hyperlipidemia   [place], [place], [place]       Future Appointments             In 3 months Orene Desanctis, Lilia Argue, [place], [place]   In 6 months  [employer], [place]

## 2019-11-26 NOTE — Telephone Encounter (Signed)
LOV: 10/16/19, NOV: 03/19/20  Last filled 09/18/19 for 30 with 1 refill.

## 2019-12-13 ENCOUNTER — Encounter: Payer: Self-pay | Admitting: Family Medicine

## 2019-12-13 ENCOUNTER — Ambulatory Visit (INDEPENDENT_AMBULATORY_CARE_PROVIDER_SITE_OTHER): Payer: Medicare HMO | Admitting: Family Medicine

## 2019-12-13 ENCOUNTER — Other Ambulatory Visit: Payer: Self-pay

## 2019-12-13 VITALS — BP 133/86 | HR 67 | Temp 97.5°F | Wt 188.0 lb

## 2019-12-13 DIAGNOSIS — R7301 Impaired fasting glucose: Secondary | ICD-10-CM | POA: Diagnosis not present

## 2019-12-13 DIAGNOSIS — R42 Dizziness and giddiness: Secondary | ICD-10-CM | POA: Diagnosis not present

## 2019-12-13 DIAGNOSIS — E782 Mixed hyperlipidemia: Secondary | ICD-10-CM | POA: Diagnosis not present

## 2019-12-13 NOTE — Assessment & Plan Note (Signed)
Recheck lipids, continue working on diet changes

## 2019-12-13 NOTE — Assessment & Plan Note (Signed)
Diet controlled, recheck A1C, adjust as needed. Continue present regimen

## 2019-12-13 NOTE — Progress Notes (Signed)
BP 133/86   Pulse 67   Temp (!) 97.5 F (36.4 C) (Oral)   Wt 188 lb (85.3 kg)   SpO2 95%   BMI 27.36 kg/m    Subjective:    Patient ID: George Glenn, male    DOB: 20-Jul-1960, 59 y.o.   MRN: [id number]  HPI: George Glenn is a 59 y.o. male  Chief Complaint  Patient presents with  . Dizziness  . IFG    pt's sister requested an A1c check for patient  . Referral    for a cardiologist   Here today with his sister, who is his primary caretaker and gives almost all of the history. Still having the room spinning dizziness quite frequently, which has caused a fair amount of frustration for him. This has been an off and on issue for him since 2016. Has seen Neurology, Cardiology, and PT in the past without lasting relief or obvious cause other than vertigo.Denies syncope, CP, SOB, HAs.   Due for A1C and lipids, sister states his diet has been very poor lately with him only wanting to eat hot dogs and nothing else. Taking medication faithfully without side effects. Denies CP, SOB, myalgias, claudication, polyuria, polyphagia, polydipsia.   Relevant past medical, surgical, family and social history reviewed and updated as indicated. Interim medical history since our last visit reviewed. Allergies and medications reviewed and updated.  Review of Systems  Per HPI unless specifically indicated above     Objective:    BP 133/86   Pulse 67   Temp (!) 97.5 F (36.4 C) (Oral)   Wt 188 lb (85.3 kg)   SpO2 95%   BMI 27.36 kg/m   Wt Readings from Last 3 Encounters:  12/13/19 188 lb (85.3 kg)  10/16/19 189 lb 9.6 oz (86 kg)  09/18/19 193 lb (87.5 kg)    Physical Exam Vitals and nursing note reviewed.  Constitutional:      Appearance: Normal appearance.  HENT:     Head: Atraumatic.  Eyes:     Extraocular Movements: Extraocular movements intact.     Conjunctiva/sclera: Conjunctivae normal.  Cardiovascular:     Rate and Rhythm: Normal rate and regular rhythm.  Pulmonary:       Effort: Pulmonary effort is normal.     Breath sounds: Normal breath sounds.  Musculoskeletal:        General: Normal range of motion.     Cervical back: Normal range of motion and neck supple.  Skin:    General: Skin is warm and dry.  Neurological:     Mental Status: Mental status is at baseline.     Motor: No weakness.     Coordination: Coordination normal.  Psychiatric:        Mood and Affect: Mood normal.        Thought Content: Thought content normal.        Judgment: Judgment normal.     Results for orders placed or performed in visit on 06/01/19  CBC With Differential/Platelet  Result Value Ref Range   WBC 4.8 3.4 - 10.8 x10E3/uL   RBC 5.56 4.14 - 5.80 x10E6/uL   Hemoglobin 17.4 13.0 - 17.7 g/dL   Hematocrit 49.4 37.5 - 51.0 %   MCV 89 79 - 97 fL   MCH 31.3 26.6 - 33.0 pg   MCHC 35.2 31 - 35 g/dL   RDW 14.2 11.6 - 15.4 %   Platelets 228 150 - 450 x10E3/uL   Neutrophils 49 Not Estab. %  Lymphs 41 Not Estab. %   MID 10 Not Estab. %   Neutrophils Absolute 2.3 1 - 7 x10E3/uL   Lymphocytes Absolute 2.0 0 - 3 x10E3/uL   MID (Absolute) 0.5 0.1 - 1.6 X10E3/uL  Glucose Hemocue Waived  Result Value Ref Range   Glu Hemocue Waived 110 (H) 65 - 99 mg/dL  HgB A1c  Result Value Ref Range   Hgb A1c MFr Bld 6.2 (H) 4.8 - 5.6 %   Est. average glucose Bld gHb Est-mCnc 131 mg/dL      Assessment & Plan:   Problem List Items Addressed This Visit      Endocrine   IFG (impaired fasting glucose) - Primary    Diet controlled, recheck A1C, adjust as needed. Continue present regimen      Relevant Orders   Comprehensive metabolic panel   HgB H8O     Other   Hyperlipidemia    Recheck lipids, continue working on diet changes      Relevant Orders   Lipid Panel w/o Chol/HDL Ratio   Vertigo   Relevant Orders   Ambulatory referral to ENT    Other Visit Diagnoses    Dizziness           Follow up plan: Return in about 6 months (around 06/14/2020) for CPE.

## 2019-12-14 ENCOUNTER — Encounter: Payer: Self-pay | Admitting: Family Medicine

## 2019-12-14 LAB — COMPREHENSIVE METABOLIC PANEL
ALT: 41 IU/L (ref 0–44)
AST: 27 IU/L (ref 0–40)
Albumin/Globulin Ratio: 1.7 (ref 1.2–2.2)
Albumin: 4.5 g/dL (ref 3.8–4.9)
Alkaline Phosphatase: 82 IU/L (ref 48–121)
BUN/Creatinine Ratio: 16 (ref 10–24)
BUN: 15 mg/dL (ref 8–27)
Bilirubin Total: 0.4 mg/dL (ref 0.0–1.2)
CO2: 26 mmol/L (ref 20–29)
Calcium: 9 mg/dL (ref 8.6–10.2)
Chloride: 103 mmol/L (ref 96–106)
Creatinine, Ser: 0.92 mg/dL (ref 0.76–1.27)
GFR calc Af Amer: 104 mL/min/{1.73_m2} (ref >59)
GFR calc non Af Amer: 90 mL/min/{1.73_m2} (ref >59)
Globulin, Total: 2.6 g/dL (ref 1.5–4.5)
Glucose: 101 mg/dL — ABNORMAL HIGH (ref 65–99)
Potassium: 4.3 mmol/L (ref 3.5–5.2)
Sodium: 143 mmol/L (ref 134–144)
Total Protein: 7.1 g/dL (ref 6.0–8.5)

## 2019-12-14 LAB — HEMOGLOBIN A1C
Est. average glucose Bld gHb Est-mCnc: 120 mg/dL
Hgb A1c MFr Bld: 5.8 % — ABNORMAL HIGH (ref 4.8–5.6)

## 2019-12-14 LAB — LIPID PANEL W/O CHOL/HDL RATIO
Cholesterol, Total: 135 mg/dL (ref 100–199)
HDL: 27 mg/dL — ABNORMAL LOW (ref >39)
LDL Chol Calc (NIH): 86 mg/dL (ref 0–99)
Triglycerides: 119 mg/dL (ref 0–149)
VLDL Cholesterol Cal: 22 mg/dL (ref 5–40)

## 2019-12-19 DIAGNOSIS — H6121 Impacted cerumen, right ear: Secondary | ICD-10-CM | POA: Diagnosis not present

## 2019-12-19 DIAGNOSIS — H8111 Benign paroxysmal vertigo, right ear: Secondary | ICD-10-CM | POA: Diagnosis not present

## 2019-12-19 DIAGNOSIS — H903 Sensorineural hearing loss, bilateral: Secondary | ICD-10-CM | POA: Diagnosis not present

## 2020-01-28 ENCOUNTER — Other Ambulatory Visit: Payer: Self-pay | Admitting: Family Medicine

## 2020-01-28 NOTE — Telephone Encounter (Signed)
Requested medication (s) are due for refill today: yes  Requested medication (s) are on the active medication list: yes  Last refill:  11/26/19  Future visit scheduled: yes  Notes to clinic:  med not delegated to NT to RF   Requested Prescriptions  Pending Prescriptions Disp Refills   meclizine (ANTIVERT) 25 MG tablet [Pharmacy Med Name: MECLIZINE 25MG  RX TABLETS] 30 tablet 1    Sig: TAKE 1 TABLET(25 MG) BY MOUTH THREE TIMES DAILY AS NEEDED FOR DIZZINESS      Not Delegated - Gastroenterology: Antiemetics Failed - 01/28/2020  2:24 PM      Failed - This refill cannot be delegated      Passed - Valid encounter within last 6 months    Recent Outpatient Visits           1 month ago IFG (impaired fasting glucose)   [facility] [place], [place]   3 months ago [place], Rachel [place], [place]   4 months ago [place], [place]   8 months ago Dizziness   [place], [place], [place]   11 months ago IFG (impaired fasting glucose)   [facility], Lilia Arg[place], [place]       Future Appointments             In 1 month Wynetta Emery, Barb Merino, DO [place], [place]   In 4 months  [employer], [place]

## 2020-01-28 NOTE — Telephone Encounter (Signed)
Previous RL patient. Last seen 12/13/19 and has appointment 03/19/20

## 2020-02-16 ENCOUNTER — Other Ambulatory Visit: Payer: Self-pay | Admitting: Family Medicine

## 2020-02-16 NOTE — Telephone Encounter (Signed)
Requested Prescriptions  Pending Prescriptions Disp Refills  . simvastatin (ZOCOR) 10 MG tablet [Pharmacy Med Name: SIMVASTATIN 10MG  TABLETS] 90 tablet 1    Sig: TAKE 1 TABLET(10 MG) BY MOUTH DAILY     Cardiovascular:  Antilipid - Statins Failed - 02/16/2020  7:08 AM      Failed - LDL in normal range and within 360 days    LDL Chol Calc (NIH)  Date Value Ref Range Status  12/13/2019 86 0 - 99 mg/dL Final         Failed - HDL in normal range and within 360 days    HDL  Date Value Ref Range Status  12/13/2019 27 (L) >39 mg/dL Final         Passed - Total Cholesterol in normal range and within 360 days    Cholesterol, Total  Date Value Ref Range Status  12/13/2019 135 100 - 199 mg/dL Final         Passed - Triglycerides in normal range and within 360 days    Triglycerides  Date Value Ref Range Status  12/13/2019 119 0 - 149 mg/dL Final         Passed - Patient is not pregnant      Passed - Valid encounter within last 12 months    Recent Outpatient Visits          2 months ago IFG (impaired fasting glucose)   [place], Lilia Argue, [place]   4 months ago [place], [place], [place]   5 months ago Vertigo   [place], [place], [place]   8 months ago [place], Rachel [place], [place]   1 year ago IFG (impaired fasting glucose)   [facility], Lilia Arg[place], [place]      Future Appointments            In 1 month Johnson, Ogema, DO [place], [place]   In 3 months  [place], [facility]           . esomeprazole ([place]) 40 MG capsule [Pharmacy Med Name: ESOMEPRAZOLE MAGNESIUM 40MG  DR CAPS] 90 capsule 1    Sig: TAKE 1 CAPSULE(40 MG) BY MOUTH DAILY     Gastroenterology: Proton Pump Inhibitors Passed - 02/16/2020  7:08 AM      Passed - Valid encounter within last 12 months    Recent Outpatient Visits          2  months ago IFG (impaired fasting glucose)   [facility] [place], [place]   4 months ago [place], [place], [place]   5 months ago [place], [place], [place]   8 months ago Dizziness   [place], [place], [place]   1 year ago IFG (impaired fasting glucose)   [place], Lilia Argue, [place]      Future Appointments            In 1 month Wynetta Emery, Barb Merino, DO [place], [place]   In 3 months  [employer], [place]

## 2020-03-19 ENCOUNTER — Ambulatory Visit: Payer: Medicare HMO | Admitting: Family Medicine

## 2020-03-19 ENCOUNTER — Encounter: Payer: Self-pay | Admitting: Family Medicine

## 2020-03-19 ENCOUNTER — Other Ambulatory Visit: Payer: Self-pay

## 2020-03-19 ENCOUNTER — Ambulatory Visit (INDEPENDENT_AMBULATORY_CARE_PROVIDER_SITE_OTHER): Payer: Medicare Other | Admitting: Family Medicine

## 2020-03-19 VITALS — BP 110/75 | HR 61 | Temp 98.5°F | Ht 69.69 in | Wt 186.4 lb

## 2020-03-19 DIAGNOSIS — Z23 Encounter for immunization: Secondary | ICD-10-CM

## 2020-03-19 DIAGNOSIS — F200 Paranoid schizophrenia: Secondary | ICD-10-CM | POA: Diagnosis not present

## 2020-03-19 DIAGNOSIS — G4700 Insomnia, unspecified: Secondary | ICD-10-CM

## 2020-03-19 MED ORDER — LORAZEPAM 0.5 MG PO TABS
ORAL_TABLET | ORAL | 2 refills | Status: DC
Start: 2020-03-19 — End: 2020-07-17

## 2020-03-19 NOTE — Progress Notes (Signed)
BP 110/75   Pulse 61   Temp 98.5 F (36.9 C) (Oral)   Ht 5' 9.69" (1.77 m)   Wt 186 lb 6.4 oz (84.6 kg)   SpO2 99%   BMI 26.99 kg/m    Subjective:    Patient ID: George Glenn, male    DOB: [birth year], 59 y.o.   MRN: [id number]  HPI: George Glenn is a 59 y.o. male  Chief Complaint  Patient presents with  . Insomnia   INSOMNIA Duration: chronic Satisfied with sleep quality: yes Difficulty falling asleep: no Difficulty staying asleep: no Waking a few hours after sleep onset: no Early morning awakenings: no Daytime hypersomnolence: no Wakes feeling refreshed: no Good sleep hygiene: no Apnea: no Snoring: no Depressed/anxious mood: no Recent stress: no Restless legs/nocturnal leg cramps: no Chronic pain/arthritis: no History of sleep study: no Treatments attempted: melatonin, uinsom and benadryl   Relevant past medical, surgical, family and social history reviewed and updated as indicated. Interim medical history since our last visit reviewed. Allergies and medications reviewed and updated.  Review of Systems  Constitutional: Negative.   Respiratory: Negative.   Cardiovascular: Negative.   Gastrointestinal: Negative.   Genitourinary: Negative.   Musculoskeletal: Negative.   Neurological: Negative.   Psychiatric/Behavioral: Negative.     Per HPI unless specifically indicated above     Objective:    BP 110/75   Pulse 61   Temp 98.5 F (36.9 C) (Oral)   Ht 5' 9.69" (1.77 m)   Wt 186 lb 6.4 oz (84.6 kg)   SpO2 99%   BMI 26.99 kg/m   Wt Readings from Last 3 Encounters:  03/19/20 186 lb 6.4 oz (84.6 kg)  12/13/19 188 lb (85.3 kg)  10/16/19 189 lb 9.6 oz (86 kg)    Physical Exam Vitals and nursing note reviewed.  Constitutional:      General: He is not in acute distress.    Appearance: Normal appearance. He is not ill-appearing, toxic-appearing or diaphoretic.  HENT:     Head: Normocephalic and atraumatic.     Right Ear: External ear  normal.     Left Ear: External ear normal.     Nose: Nose normal.     Mouth/Throat:     Mouth: Mucous membranes are moist.     Pharynx: Oropharynx is clear.  Eyes:     General: No scleral icterus.       Right eye: No discharge.        Left eye: No discharge.     Extraocular Movements: Extraocular movements intact.     Conjunctiva/sclera: Conjunctivae normal.     Pupils: Pupils are equal, round, and reactive to light.  Cardiovascular:     Rate and Rhythm: Normal rate and regular rhythm.     Pulses: Normal pulses.     Heart sounds: Normal heart sounds. No murmur heard.  No friction rub. No gallop.   Pulmonary:     Effort: Pulmonary effort is normal. No respiratory distress.     Breath sounds: Normal breath sounds. No stridor. No wheezing, rhonchi or rales.  Chest:     Chest wall: No tenderness.  Musculoskeletal:        General: Normal range of motion.     Cervical back: Normal range of motion and neck supple.  Skin:    General: Skin is warm and dry.     Capillary Refill: Capillary refill takes less than 2 seconds.     Coloration: Skin is not jaundiced or pale.  Findings: No bruising, erythema, lesion or rash.  Neurological:     General: No focal deficit present.     Mental Status: He is alert and oriented to person, place, and time. Mental status is at baseline.  Psychiatric:        Mood and Affect: Mood normal.        Behavior: Behavior normal.        Thought Content: Thought content normal.        Judgment: Judgment normal.     Results for orders placed or performed in visit on 12/13/19  Lipid Panel w/o Chol/HDL Ratio  Result Value Ref Range   Cholesterol, Total 135 100 - 199 mg/dL   Triglycerides 119 0 - 149 mg/dL   HDL 27 (L) >39 mg/dL   VLDL Cholesterol Cal 22 5 - 40 mg/dL   LDL Chol Calc (NIH) 86 0 - 99 mg/dL  Comprehensive metabolic panel  Result Value Ref Range   Glucose 101 (H) 65 - 99 mg/dL   BUN 15 8 - 27 mg/dL   Creatinine, Ser 0.92 0.76 - 1.27 mg/dL    GFR calc non Af Amer 90 >59 mL/min/1.73   GFR calc Af Amer 104 >59 mL/min/1.73   BUN/Creatinine Ratio 16 10 - 24   Sodium 143 134 - 144 mmol/L   Potassium 4.3 3.5 - 5.2 mmol/L   Chloride 103 96 - 106 mmol/L   CO2 26 20 - 29 mmol/L   Calcium 9.0 8.6 - 10.2 mg/dL   Total Protein 7.1 6.0 - 8.5 g/dL   Albumin 4.5 3.8 - 4.9 g/dL   Globulin, Total 2.6 1.5 - 4.5 g/dL   Albumin/Globulin Ratio 1.7 1.2 - 2.2   Bilirubin Total 0.4 0.0 - 1.2 mg/dL   Alkaline Phosphatase 82 48 - 121 IU/L   AST 27 0 - 40 IU/L   ALT 41 0 - 44 IU/L  HgB A1c  Result Value Ref Range   Hgb A1c MFr Bld 5.8 (H) 4.8 - 5.6 %   Est. average glucose Bld gHb Est-mCnc 120 mg/dL      Assessment & Plan:   Problem List Items Addressed This Visit      Other   Paranoid schizophrenia ([place])    Stable. Continue to monitor. Call with any concerns.       Insomnia - Primary    Under good control on current regimen. Continue current regimen. Continue to monitor. Call with any concerns. Refills given for 3 months. Follow up for 6 month follow up in 3 months.         Other Visit Diagnoses    Need for influenza vaccination       Flu shot given today.    Relevant Orders   Flu Vaccine QUAD 6+ mos PF IM (Fluarix Quad PF) (Completed)       Follow up plan: Return in about 3 months (around 06/17/2020).

## 2020-03-20 ENCOUNTER — Encounter: Payer: Self-pay | Admitting: Family Medicine

## 2020-03-20 DIAGNOSIS — G4700 Insomnia, unspecified: Secondary | ICD-10-CM | POA: Insufficient documentation

## 2020-03-20 NOTE — Assessment & Plan Note (Signed)
Stable. Continue to monitor. Call with any concerns.  ?

## 2020-03-20 NOTE — Assessment & Plan Note (Signed)
Under good control on current regimen. Continue current regimen. Continue to monitor. Call with any concerns. Refills given for 3 months. Follow up for 6 month follow up in 3 months.

## 2020-03-25 ENCOUNTER — Ambulatory Visit (INDEPENDENT_AMBULATORY_CARE_PROVIDER_SITE_OTHER): Payer: Medicare Other | Admitting: Family Medicine

## 2020-03-25 ENCOUNTER — Encounter: Payer: Self-pay | Admitting: Family Medicine

## 2020-03-25 ENCOUNTER — Other Ambulatory Visit: Payer: Self-pay

## 2020-03-25 DIAGNOSIS — S91209A Unspecified open wound of unspecified toe(s) with damage to nail, initial encounter: Secondary | ICD-10-CM | POA: Insufficient documentation

## 2020-03-25 NOTE — Patient Instructions (Signed)
It was great to see you!  Our plans for today:  - Keep your toe wrapped for the next day or so. If you notice swelling, bleeding, worsened pain or inability to bear weight, let us know. - Keep the podiatry appointment to get his nails trimmed.   Take care and seek immediate care sooner if you develop any concerns.   Dr. Ky Barban

## 2020-03-25 NOTE — Assessment & Plan Note (Signed)
Traumatic avulsion without evidence of phalanx fracture or infection, rewrapped today. Good pulses, diabetic foot exam completed today. Recommend keeping podiatric appointment for clipping of other nails. F/u prn.

## 2020-03-25 NOTE — Progress Notes (Signed)
    SUBJECTIVE:   CHIEF COMPLAINT / HPI: toenail came off  Past Medical History:  Diagnosis Date  . Anemia   . Barrett's esophagus   . Brain damage   . Chronic kidney disease   . Diabetes mellitus without complication [facility])    sister states he is no longer diabetic due to diet changes and weight loss  . Dizziness    WHEN STANDING  . GERD (gastroesophageal reflux disease)    Barrett's  . Hyperlipidemia   . Hypertension   . Hypogonadism in male   . Memory loss, short term   . Osteoporosis   . Paranoid schizophrenia ([place])   . Tic    HANDS   Toenail complaint - hit toe yesterday, doesn't remember on what, was unwitnessed. Reports toenail flipped up and fell off. Bleeding yesterday. - wrapping with gauze and neoposporin. - diet controlled diabetic. - wears house shoes.  - has podiatry appt next week.   OBJECTIVE:   BP 136/88   Pulse 90   Temp 97.8 F (36.6 C)   SpO2 92%   Gen: well appearing, in NAD Ext: WWP, no LE edema. L great toe with toenail remvoved, no bleeding, swelling, bruising or pus. Able to bear weight without difficulty or pain. Full AROM of toes without pain. 2+ DP pulses. Intact to monofilament bilaterally. Hyperkeratotic R great toenail.   ASSESSMENT/PLAN:   Toenail avulsion, initial encounter Traumatic avulsion without evidence of phalanx fracture or infection, rewrapped today. Good pulses, diabetic foot exam completed today. Recommend keeping podiatric appointment for clipping of other nails. F/u prn.    Myles Gip, DO

## 2020-04-10 DIAGNOSIS — R519 Headache, unspecified: Secondary | ICD-10-CM | POA: Diagnosis not present

## 2020-04-10 DIAGNOSIS — R42 Dizziness and giddiness: Secondary | ICD-10-CM | POA: Diagnosis not present

## 2020-04-10 DIAGNOSIS — Z03818 Encounter for observation for suspected exposure to other biological agents ruled out: Secondary | ICD-10-CM | POA: Diagnosis not present

## 2020-05-23 ENCOUNTER — Other Ambulatory Visit: Payer: Self-pay | Admitting: Family Medicine

## 2020-06-12 ENCOUNTER — Ambulatory Visit: Payer: Self-pay

## 2020-07-15 ENCOUNTER — Other Ambulatory Visit: Payer: Self-pay | Admitting: Family Medicine

## 2020-07-15 NOTE — Telephone Encounter (Signed)
Requested medications are due for refill today.  yes  Requested medications are on the active medications list.  yes  Last refill. 03/2020  Future visit scheduled.   no  Notes to clinic.  Medication not delegated.

## 2020-07-16 NOTE — Telephone Encounter (Signed)
Patient last seen in office on 03/2020 and no future appointment.

## 2020-07-17 ENCOUNTER — Ambulatory Visit: Payer: Medicare Other | Admitting: Nurse Practitioner

## 2020-07-17 ENCOUNTER — Telehealth: Payer: Self-pay

## 2020-07-17 MED ORDER — LORAZEPAM 0.5 MG PO TABS: ORAL_TABLET | ORAL | 2 refills | Status: DC

## 2020-07-17 NOTE — Progress Notes (Deleted)
   There were no vitals taken for this visit.   Subjective:    Patient ID: George Glenn, male    DOB: January 14, 1961, 60 y.o.   MRN: [id number]  HPI: George Glenn is a 60 y.o. male  No chief complaint on file.  INSOMNIA Duration: chronic Satisfied with sleep quality: yes Difficulty falling asleep: no Difficulty staying asleep: no Waking a few hours after sleep onset: no Early morning awakenings: no Daytime hypersomnolence: no Wakes feeling refreshed: no Good sleep hygiene: no Apnea: no Snoring: no Depressed/anxious mood: no Recent stress: no Restless legs/nocturnal leg cramps: no Chronic pain/arthritis: no History of sleep study: no Treatments attempted: melatonin, uinsom and benadryl  Relevant past medical, surgical, family and social history reviewed and updated as indicated. Interim medical history since our last visit reviewed. Allergies and medications reviewed and updated.  Review of Systems  Per HPI unless specifically indicated above     Objective:    There were no vitals taken for this visit.  Wt Readings from Last 3 Encounters:  03/19/20 186 lb 6.4 oz (84.6 kg)  12/13/19 188 lb (85.3 kg)  10/16/19 189 lb 9.6 oz (86 kg)    Physical Exam  Results for orders placed or performed in visit on 12/13/19  Lipid Panel w/o Chol/HDL Ratio  Result Value Ref Range   Cholesterol, Total 135 100 - 199 mg/dL   Triglycerides 119 0 - 149 mg/dL   HDL 27 (L) >39 mg/dL   VLDL Cholesterol Cal 22 5 - 40 mg/dL   LDL Chol Calc (NIH) 86 0 - 99 mg/dL  Comprehensive metabolic panel  Result Value Ref Range   Glucose 101 (H) 65 - 99 mg/dL   BUN 15 8 - 27 mg/dL   Creatinine, Ser 0.92 0.76 - 1.27 mg/dL   GFR calc non Af Amer 90 >59 mL/min/1.73   GFR calc Af Amer 104 >59 mL/min/1.73   BUN/Creatinine Ratio 16 10 - 24   Sodium 143 134 - 144 mmol/L   Potassium 4.3 3.5 - 5.2 mmol/L   Chloride 103 96 - 106 mmol/L   CO2 26 20 - 29 mmol/L   Calcium 9.0 8.6 - 10.2 mg/dL   Total  Protein 7.1 6.0 - 8.5 g/dL   Albumin 4.5 3.8 - 4.9 g/dL   Globulin, Total 2.6 1.5 - 4.5 g/dL   Albumin/Globulin Ratio 1.7 1.2 - 2.2   Bilirubin Total 0.4 0.0 - 1.2 mg/dL   Alkaline Phosphatase 82 48 - 121 IU/L   AST 27 0 - 40 IU/L   ALT 41 0 - 44 IU/L  HgB A1c  Result Value Ref Range   Hgb A1c MFr Bld 5.8 (H) 4.8 - 5.6 %   Est. average glucose Bld gHb Est-mCnc 120 mg/dL      Assessment & Plan:   Problem List Items Addressed This Visit   None      Follow up plan: No follow-ups on file.

## 2020-07-17 NOTE — Telephone Encounter (Signed)
Called [name] pt's sister advised rx has been sent she understood understanding

## 2020-07-17 NOTE — Telephone Encounter (Signed)
Pts guardian stated he is completely out of Lorazepam was schduled today to see Santiago Glad but unfratuanly we had to reshcule apt, Pt's Guardian asking if there is anyway they can go ahead and send medication in as he has been out for 2 days and was counting on getting refill today.

## 2020-07-17 NOTE — Telephone Encounter (Signed)
Medication sent to the pharmacy for patient. ?

## 2020-07-18 ENCOUNTER — Other Ambulatory Visit: Payer: Self-pay

## 2020-07-18 ENCOUNTER — Encounter: Payer: Self-pay | Admitting: Nurse Practitioner

## 2020-07-18 ENCOUNTER — Ambulatory Visit (INDEPENDENT_AMBULATORY_CARE_PROVIDER_SITE_OTHER): Payer: Medicare Other | Admitting: Nurse Practitioner

## 2020-07-18 VITALS — BP 119/76 | HR 80 | Temp 98.6°F | Wt 184.8 lb

## 2020-07-18 DIAGNOSIS — F200 Paranoid schizophrenia: Secondary | ICD-10-CM | POA: Diagnosis not present

## 2020-07-18 DIAGNOSIS — G4700 Insomnia, unspecified: Secondary | ICD-10-CM

## 2020-07-18 DIAGNOSIS — R7301 Impaired fasting glucose: Secondary | ICD-10-CM | POA: Diagnosis not present

## 2020-07-18 DIAGNOSIS — E782 Mixed hyperlipidemia: Secondary | ICD-10-CM

## 2020-07-18 LAB — BAYER DCA HB A1C WAIVED: HB A1C (BAYER DCA - WAIVED): 5.7 % (ref <7.0)

## 2020-07-18 NOTE — Assessment & Plan Note (Signed)
Chronic, stable. Does well with lorazepam. No side effects, benefits and risks reviewed and his caregiver wishes to continue with medication. Follow-up in 3 months.

## 2020-07-18 NOTE — Progress Notes (Signed)
Established Patient Office Visit  Subjective:  Patient ID: George Glenn, male    DOB: [birth year]  Age: 60 y.o. MRN: [id number]  CC:  Chief Complaint  Patient presents with  . Hyperlipidemia    HPI George Glenn presents for follow-up with hyperlipidemia and insomnia. His sister says that he is doing well.    HYPERLIPIDEMIA  Hyperlipidemia status: chronic Satisfied with current treatment?  yes Side effects:  no Medication compliance: excellent compliance Past cholesterol meds: simvastatin (zocor) Supplements: none Aspirin:  no The 10-year ASCVD risk score Mikey Bussing DC Jr., et al., 2013) is: 15.4%   Values used to calculate the score:     Age: 39 years     Sex: Male     Is Non-Hispanic African American: No     Diabetic: Yes     Tobacco smoker: No     Systolic Blood Pressure: 235 mmHg     Is BP treated: No     HDL Cholesterol: 27 mg/dL     Total Cholesterol: 135 mg/dL Chest pain:  no  INSOMNIA  Doing well with lorazepam. No side effects.   Past Medical History:  Diagnosis Date  . Anemia   . Barrett's esophagus   . Brain damage   . Chronic kidney disease   . Diabetes mellitus without complication [facility])    sister states he is no longer diabetic due to diet changes and weight loss  . Dizziness    WHEN STANDING  . GERD (gastroesophageal reflux disease)    Barrett's  . Hyperlipidemia   . Hypertension   . Hypogonadism in male   . Memory loss, short term   . Osteoporosis   . Paranoid schizophrenia ([place])   . Tic    HANDS    Past Surgical History:  Procedure Laterality Date  . CATARACT EXTRACTION W/PHACO Right 11/12/2014   Procedure: CATARACT EXTRACTION PHACO AND INTRAOCULAR LENS PLACEMENT (IOC);  Surgeon: Birder Robson, MD;  Location: [facility] ORS;  Service: Ophthalmology;  Laterality: Right;  cassette Lot # [id number]     [place]  00:45 AP 14.7 CDE 6.62  . CATARACT EXTRACTION W/PHACO Left 12/10/2014   Procedure: CATARACT EXTRACTION PHACO AND INTRAOCULAR LENS  PLACEMENT (IOC);  Surgeon: Birder Robson, MD;  Location: [facility] ORS;  Service: Ophthalmology;  Laterality: Left;  US:00:46.1 AP:20.0 CDE:9.22 LOT PACK #[id number]  . COLONOSCOPY    . ESOPHAGOGASTRODUODENOSCOPY (EGD) WITH PROPOFOL N/A 04/23/2016   Procedure: ESOPHAGOGASTRODUODENOSCOPY (EGD) WITH PROPOFOL;  Surgeon: [name], MD;  Location: [facility] ENDOSCOPY;  Service: Endoscopy;  Laterality: N/A;    Family History  Problem Relation Age of Onset  . Hypertension Father   . Diabetes Father   . Heart disease Father   . Hyperlipidemia Father   . Cancer Mother        Skin  . Dementia Mother   . Diabetes Brother   . Stroke Maternal Grandmother   . Stroke Maternal Grandfather   . Heart disease Paternal Grandmother   . Heart disease Paternal Grandfather     Social History   Socioeconomic History  . Marital status: Single    Spouse name: Not on file  . Number of children: Not on file  . Years of education: Not on file  . Highest education level: Not on file  Occupational History  . Occupation: disability   Tobacco Use  . Smoking status: Never Smoker  . Smokeless tobacco: Never Used  Vaping Use  . Vaping Use: Never used  Substance and Sexual Activity  .  Alcohol use: No    Alcohol/week: 0.0 standard drinks  . Drug use: No  . Sexual activity: Never  Other Topics Concern  . Not on file  Social History Narrative   Sister is legal guardian    Social Determinants of Health   Financial Resource Strain: Not on file  Food Insecurity: Not on file  Transportation Needs: Not on file  Physical Activity: Not on file  Stress: Not on file  Social Connections: Not on file  Intimate Partner Violence: Not on file    Outpatient Medications Prior to Visit  Medication Sig Dispense Refill  . Cyanocobalamin (VITAMIN B 12 PO) Take 1 tablet by mouth daily.     Mariane Baumgarten Calcium (STOOL SOFTENER PO) Take 1 capsule by mouth daily as needed.    Marland Kitchen esomeprazole (NEXIUM) 40 MG capsule TAKE 1  CAPSULE(40 MG) BY MOUTH DAILY 90 capsule 1  . LORazepam (ATIVAN) 0.5 MG tablet TAKE 1 TABLET(0.5 MG) BY MOUTH AT BEDTIME 30 tablet 2  . meclizine (ANTIVERT) 25 MG tablet TAKE 1 TABLET(25 MG) BY MOUTH THREE TIMES DAILY AS NEEDED FOR DIZZINESS 30 tablet 1  . simvastatin (ZOCOR) 10 MG tablet TAKE 1 TABLET(10 MG) BY MOUTH DAILY 90 tablet 1   No facility-administered medications prior to visit.    No Known Allergies  ROS Review of Systems  Constitutional: Negative.   HENT: Negative.   Eyes: Negative.   Respiratory: Negative.   Cardiovascular: Negative.   Endocrine: Negative.   Genitourinary: Negative.   Musculoskeletal: Negative.   Neurological: Negative.   Psychiatric/Behavioral: Negative.       Objective:    Physical Exam Vitals and nursing note reviewed.  Constitutional:      Appearance: Normal appearance.  HENT:     Head: Normocephalic.  Eyes:     Conjunctiva/sclera: Conjunctivae normal.  Cardiovascular:     Rate and Rhythm: Normal rate and regular rhythm.     Pulses: Normal pulses.     Heart sounds: Normal heart sounds.  Pulmonary:     Effort: Pulmonary effort is normal.     Breath sounds: Normal breath sounds.  Musculoskeletal:     Cervical back: Normal range of motion.  Skin:    General: Skin is warm and dry.  Neurological:     Mental Status: He is alert. He is disoriented.  Psychiatric:     Comments: Slow to respond to questions. Flat affect     BP 119/76   Pulse 80   Temp 98.6 F (37 C) (Oral)   Wt 184 lb 12.8 oz (83.8 kg)   SpO2 93%   BMI 26.76 kg/m  Wt Readings from Last 3 Encounters:  07/18/20 184 lb 12.8 oz (83.8 kg)  03/19/20 186 lb 6.4 oz (84.6 kg)  12/13/19 188 lb (85.3 kg)    Lab Results  Component Value Date   WBC 4.8 06/01/2019   HGB 17.4 06/01/2019   HCT 49.4 06/01/2019   MCV 89 06/01/2019   PLT 228 06/01/2019   Lab Results  Component Value Date   NA 143 12/13/2019   K 4.3 12/13/2019   CO2 26 12/13/2019   GLUCOSE 101 (H)  12/13/2019   BUN 15 12/13/2019   CREATININE 0.92 12/13/2019   BILITOT 0.4 12/13/2019   ALKPHOS 82 12/13/2019   AST 27 12/13/2019   ALT 41 12/13/2019   PROT 7.1 12/13/2019   ALBUMIN 4.5 12/13/2019   CALCIUM 9.0 12/13/2019   Lab Results  Component Value Date   CHOL  135 12/13/2019   Lab Results  Component Value Date   HDL 27 (L) 12/13/2019   Lab Results  Component Value Date   LDLCALC 86 12/13/2019   Lab Results  Component Value Date   TRIG 119 12/13/2019    Lab Results  Component Value Date   HGBA1C 5.8 (H) 12/13/2019      Assessment & Plan:   Problem List Items Addressed This Visit      Endocrine   IFG (impaired fasting glucose) - Primary    Chronic, stable with diet. A1C today is 5.7%. Continue watching carbs/sugars. Follow-up in 6 months      Relevant Orders   Comp Met (CMET)   Bayer DCA Hb A1c Waived     Other   Paranoid schizophrenia (HCC)    Chronic, stable. Lives with sister who helps care for him. Continue current regimen.      Hyperlipidemia    Chronic, stable. Continue current regimen. Check lipid panel today. Follow-up in 6 months      Relevant Orders   Comp Met (CMET)   CBC w/Diff   Lipid Panel w/o Chol/HDL Ratio   Insomnia    Chronic, stable. Does well with lorazepam. No side effects, benefits and risks reviewed and his caregiver wishes to continue with medication. Follow-up in 3 months.         No orders of the defined types were placed in this encounter.   Follow-up: Return in about 3 months (around 10/17/2020) for insomnia.    Charyl Dancer, NP

## 2020-07-18 NOTE — Assessment & Plan Note (Signed)
Chronic, stable. Lives with sister who helps care for him. Continue current regimen.

## 2020-07-18 NOTE — Assessment & Plan Note (Signed)
Chronic, stable. Continue current regimen. Check lipid panel today. Follow-up in 6 months. 

## 2020-07-18 NOTE — Assessment & Plan Note (Signed)
Chronic, stable with diet. A1C today is 5.7%. Continue watching carbs/sugars. Follow-up in 6 months

## 2020-07-18 NOTE — Patient Instructions (Signed)
It was great to see you!  Continue your current medications. Keep up the great work!  Let's follow-up in 3 months, sooner if you have concerns.  Take care,  Vance Peper, NP

## 2020-07-19 LAB — COMPREHENSIVE METABOLIC PANEL
ALT: 40 IU/L (ref 0–44)
AST: 27 IU/L (ref 0–40)
Albumin/Globulin Ratio: 1.6 (ref 1.2–2.2)
Albumin: 4.5 g/dL (ref 3.8–4.9)
Alkaline Phosphatase: 87 IU/L (ref 44–121)
BUN/Creatinine Ratio: 18 (ref 10–24)
BUN: 18 mg/dL (ref 8–27)
Bilirubin Total: 0.5 mg/dL (ref 0.0–1.2)
CO2: 24 mmol/L (ref 20–29)
Calcium: 9.3 mg/dL (ref 8.6–10.2)
Chloride: 102 mmol/L (ref 96–106)
Creatinine, Ser: 0.98 mg/dL (ref 0.76–1.27)
Globulin, Total: 2.8 g/dL (ref 1.5–4.5)
Glucose: 124 mg/dL — ABNORMAL HIGH (ref 65–99)
Potassium: 4.4 mmol/L (ref 3.5–5.2)
Sodium: 141 mmol/L (ref 134–144)
Total Protein: 7.3 g/dL (ref 6.0–8.5)
eGFR: 88 mL/min/{1.73_m2} (ref >59)

## 2020-07-19 LAB — CBC WITH DIFFERENTIAL/PLATELET
Basophils Absolute: 0 10*3/uL (ref 0.0–0.2)
Basos: 1 %
EOS (ABSOLUTE): 0.1 10*3/uL (ref 0.0–0.4)
Eos: 3 %
Hematocrit: 52 % — ABNORMAL HIGH (ref 37.5–51.0)
Hemoglobin: 17.3 g/dL (ref 13.0–17.7)
Immature Grans (Abs): 0 10*3/uL (ref 0.0–0.1)
Immature Granulocytes: 0 %
Lymphocytes Absolute: 1.8 10*3/uL (ref 0.7–3.1)
Lymphs: 35 %
MCH: 29.8 pg (ref 26.6–33.0)
MCHC: 33.3 g/dL (ref 31.5–35.7)
MCV: 90 fL (ref 79–97)
Monocytes Absolute: 0.4 10*3/uL (ref 0.1–0.9)
Monocytes: 8 %
Neutrophils Absolute: 2.7 10*3/uL (ref 1.4–7.0)
Neutrophils: 53 %
Platelets: 232 10*3/uL (ref 150–450)
RBC: 5.8 x10E6/uL (ref 4.14–5.80)
RDW: 13.3 % (ref 11.6–15.4)
WBC: 5 10*3/uL (ref 3.4–10.8)

## 2020-07-19 LAB — LIPID PANEL W/O CHOL/HDL RATIO
Cholesterol, Total: 140 mg/dL (ref 100–199)
HDL: 27 mg/dL — ABNORMAL LOW (ref >39)
LDL Chol Calc (NIH): 94 mg/dL (ref 0–99)
Triglycerides: 102 mg/dL (ref 0–149)
VLDL Cholesterol Cal: 19 mg/dL (ref 5–40)

## 2020-09-18 ENCOUNTER — Other Ambulatory Visit: Payer: Self-pay | Admitting: Family Medicine

## 2020-10-17 ENCOUNTER — Ambulatory Visit: Payer: Medicare Other | Admitting: Nurse Practitioner

## 2020-10-20 NOTE — Progress Notes (Signed)
BP 130/88   Pulse 74   Wt 189 lb 4 oz (85.8 kg)   SpO2 92%   BMI 27.40 kg/m    Subjective:    Patient ID: George Glenn, male    DOB: January 02, 1961, 60 y.o.   MRN: [id number]  HPI: George Glenn is a 60 y.o. male  Chief Complaint  Patient presents with   Medication Refill    Lorazepam and dizziness    INSOMNIA Patient is here with his sister who is his caregiver. Patient takes ativan to help with agitation. He has brain damage and has tried other medications such as Risperdal but it was not as effective as the ativan has been.  Patient is able to sleep well at night.  Denies concerns at visit today.   Relevant past medical, surgical, family and social history reviewed and updated as indicated. Interim medical history since our last visit reviewed. Allergies and medications reviewed and updated.  Review of Systems  Psychiatric/Behavioral:  Positive for behavioral problems. Negative for sleep disturbance.    Per HPI unless specifically indicated above     Objective:    BP 130/88   Pulse 74   Wt 189 lb 4 oz (85.8 kg)   SpO2 92%   BMI 27.40 kg/m   Wt Readings from Last 3 Encounters:  10/21/20 189 lb 4 oz (85.8 kg)  07/18/20 184 lb 12.8 oz (83.8 kg)  03/19/20 186 lb 6.4 oz (84.6 kg)    Physical Exam Vitals and nursing note reviewed.  Constitutional:      General: He is not in acute distress.    Appearance: Normal appearance. He is not ill-appearing, toxic-appearing or diaphoretic.  HENT:     Head: Normocephalic.     Right Ear: External ear normal.     Left Ear: External ear normal.     Nose: Nose normal. No congestion or rhinorrhea.     Mouth/Throat:     Mouth: Mucous membranes are moist.  Eyes:     General:        Right eye: No discharge.        Left eye: No discharge.     Extraocular Movements: Extraocular movements intact.     Conjunctiva/sclera: Conjunctivae normal.     Pupils: Pupils are equal, round, and reactive to light.  Cardiovascular:      Rate and Rhythm: Normal rate and regular rhythm.     Heart sounds: No murmur heard. Pulmonary:     Effort: Pulmonary effort is normal. No respiratory distress.     Breath sounds: Normal breath sounds. No wheezing, rhonchi or rales.  Abdominal:     General: Abdomen is flat. Bowel sounds are normal.  Musculoskeletal:     Cervical back: Normal range of motion and neck supple.  Skin:    General: Skin is warm and dry.     Capillary Refill: Capillary refill takes less than 2 seconds.  Neurological:     General: No focal deficit present.     Mental Status: He is alert and oriented to person, place, and time.  Psychiatric:        Mood and Affect: Mood normal.        Behavior: Behavior normal.        Thought Content: Thought content normal.        Judgment: Judgment normal.    Results for orders placed or performed in visit on 07/18/20  Comp Met (CMET)  Result Value Ref Range   Glucose 124 (H)  65 - 99 mg/dL   BUN 18 8 - 27 mg/dL   Creatinine, Ser 0.98 0.76 - 1.27 mg/dL   eGFR 88 >59 mL/min/1.73   BUN/Creatinine Ratio 18 10 - 24   Sodium 141 134 - 144 mmol/L   Potassium 4.4 3.5 - 5.2 mmol/L   Chloride 102 96 - 106 mmol/L   CO2 24 20 - 29 mmol/L   Calcium 9.3 8.6 - 10.2 mg/dL   Total Protein 7.3 6.0 - 8.5 g/dL   Albumin 4.5 3.8 - 4.9 g/dL   Globulin, Total 2.8 1.5 - 4.5 g/dL   Albumin/Globulin Ratio 1.6 1.2 - 2.2   Bilirubin Total 0.5 0.0 - 1.2 mg/dL   Alkaline Phosphatase 87 44 - 121 IU/L   AST 27 0 - 40 IU/L   ALT 40 0 - 44 IU/L  CBC w/Diff  Result Value Ref Range   WBC 5.0 3.4 - 10.8 x10E3/uL   RBC 5.80 4.14 - 5.80 x10E6/uL   Hemoglobin 17.3 13.0 - 17.7 g/dL   Hematocrit 52.0 (H) 37.5 - 51.0 %   MCV 90 79 - 97 fL   MCH 29.8 26.6 - 33.0 pg   MCHC 33.3 31.5 - 35.7 g/dL   RDW 13.3 11.6 - 15.4 %   Platelets 232 150 - 450 x10E3/uL   Neutrophils 53 Not Estab. %   Lymphs 35 Not Estab. %   Monocytes 8 Not Estab. %   Eos 3 Not Estab. %   Basos 1 Not Estab. %   Neutrophils  Absolute 2.7 1.4 - 7.0 x10E3/uL   Lymphocytes Absolute 1.8 0.7 - 3.1 x10E3/uL   Monocytes Absolute 0.4 0.1 - 0.9 x10E3/uL   EOS (ABSOLUTE) 0.1 0.0 - 0.4 x10E3/uL   Basophils Absolute 0.0 0.0 - 0.2 x10E3/uL   Immature Granulocytes 0 Not Estab. %   Immature Grans (Abs) 0.0 0.0 - 0.1 x10E3/uL  Lipid Panel w/o Chol/HDL Ratio  Result Value Ref Range   Cholesterol, Total 140 100 - 199 mg/dL   Triglycerides 102 0 - 149 mg/dL   HDL 27 (L) >39 mg/dL   VLDL Cholesterol Cal 19 5 - 40 mg/dL   LDL Chol Calc (NIH) 94 0 - 99 mg/dL  Bayer DCA Hb A1c Waived  Result Value Ref Range   HB A1C (BAYER DCA - WAIVED) 5.7 <7.0 %      Assessment & Plan:   Problem List Items Addressed This Visit       Other   Insomnia - Primary    Chronic.  Controlled.  Continue with current medication regimen.  Refills sent today. Follow up in 3 months.       Other Visit Diagnoses     Colon cancer screening       Relevant Orders   Ambulatory referral to Gastroenterology   Elevated blood pressure reading       Recommend checking blood pressures at home regularly and bringing log to next visit.  If necessary, will start medications at next visit.         Follow up plan: Return in about 3 months (around 01/21/2021) for HTN, HLD, DM2 FU.

## 2020-10-21 ENCOUNTER — Encounter: Payer: Self-pay | Admitting: Nurse Practitioner

## 2020-10-21 ENCOUNTER — Other Ambulatory Visit: Payer: Self-pay

## 2020-10-21 ENCOUNTER — Ambulatory Visit (INDEPENDENT_AMBULATORY_CARE_PROVIDER_SITE_OTHER): Payer: Medicare Other | Admitting: Nurse Practitioner

## 2020-10-21 VITALS — BP 130/88 | HR 74 | Wt 189.2 lb

## 2020-10-21 DIAGNOSIS — R030 Elevated blood-pressure reading, without diagnosis of hypertension: Secondary | ICD-10-CM

## 2020-10-21 DIAGNOSIS — Z1211 Encounter for screening for malignant neoplasm of colon: Secondary | ICD-10-CM | POA: Diagnosis not present

## 2020-10-21 DIAGNOSIS — G4700 Insomnia, unspecified: Secondary | ICD-10-CM

## 2020-10-21 MED ORDER — MECLIZINE HCL 25 MG PO TABS: ORAL_TABLET | ORAL | 1 refills | Status: DC

## 2020-10-21 MED ORDER — LORAZEPAM 0.5 MG PO TABS: ORAL_TABLET | ORAL | 2 refills | Status: DC

## 2020-10-21 NOTE — Assessment & Plan Note (Signed)
Chronic.  Controlled.  Continue with current medication regimen.  Refills sent today. Follow up in 3 months.

## 2020-10-28 ENCOUNTER — Other Ambulatory Visit: Payer: Self-pay

## 2020-10-28 MED ORDER — SUTAB 1479-225-188 MG PO TABS: 12.0000 | ORAL_TABLET | Freq: Two times a day (BID) | ORAL | 0 refills | Status: DC

## 2020-10-31 ENCOUNTER — Other Ambulatory Visit: Payer: Self-pay | Admitting: Nurse Practitioner

## 2020-10-31 ENCOUNTER — Other Ambulatory Visit: Payer: Self-pay | Admitting: Family Medicine

## 2020-10-31 NOTE — Telephone Encounter (Signed)
Receipt confirmed by pharmacy  On 10/31/20 at 1216 pm.

## 2020-11-04 ENCOUNTER — Encounter: Admission: RE | Payer: Self-pay | Source: Home / Self Care

## 2020-11-04 ENCOUNTER — Ambulatory Visit: Admission: RE | Admit: 2020-11-04 | Payer: Medicare Other | Source: Home / Self Care | Admitting: Gastroenterology

## 2020-11-04 SURGERY — COLONOSCOPY
Anesthesia: General

## 2021-01-11 ENCOUNTER — Other Ambulatory Visit: Payer: Self-pay | Admitting: Nurse Practitioner

## 2021-01-11 NOTE — Telephone Encounter (Signed)
Approved per protocol. Requested Prescriptions  Pending Prescriptions Disp Refills  . esomeprazole (NEXIUM) 40 MG capsule [Pharmacy Med Name: ESOMEPRAZOLE MAG 40MG  DR  CAPS] 90 capsule 0    Sig: TAKE 1 CAPSULE(40 MG) BY MOUTH DAILY     Gastroenterology: Proton Pump Inhibitors Passed - 01/11/2021  7:15 AM      Passed - Valid encounter within last 12 months    Recent Outpatient Visits          2 months ago Insomnia, unspecified type   [facility] Jon Billings, NP   5 months ago IFG (impaired fasting glucose)   [facility], Lauren A, NP   9 months ago Toenail avulsion, initial encounter   [place], DO   9 months ago Insomnia, unspecified type   [facility], [place], DO   1 year ago IFG (impaired fasting glucose)   [facility], [place], [place], [place]

## 2021-02-06 ENCOUNTER — Other Ambulatory Visit: Payer: Self-pay | Admitting: Nurse Practitioner

## 2021-02-07 NOTE — Telephone Encounter (Signed)
Requested medication (s) are due for refill today: yes  Requested medication (s) are on the active medication list: yes  Last refill:  10/21/20 same day for each requested med  Future visit scheduled: no  Notes to clinic:  both meds not delegated to NT to RF   Requested Prescriptions  Pending Prescriptions Disp Refills   meclizine (ANTIVERT) 25 MG tablet [Pharmacy Med Name: MECLIZINE 25MG  RX TABLETS] 30 tablet 1    Sig: TAKE 1 TABLET(25 MG) BY MOUTH THREE TIMES DAILY AS NEEDED FOR DIZZINESS     Not Delegated - Gastroenterology: Antiemetics Failed - 02/06/2021  6:25 PM      Failed - This refill cannot be delegated      Passed - Valid encounter within last 6 months    Recent Outpatient Visits           3 months ago Insomnia, unspecified type   [facility] Jon Billings, NP   6 months ago IFG (impaired fasting glucose)   [facility] [place], Lauren A, NP   10 months ago Toenail avulsion, initial encounter   [facility] Rory Percy M, DO   10 months ago Insomnia, unspecified type   [facility] [place], [place], DO   1 year ago IFG (impaired fasting glucose)   [facility], [place], PA-C               LORazepam (ATIVAN) 0.5 MG tablet [Pharmacy Med Name: LORAZEPAM 0.5MG  TABLETS] 30 tablet     Sig: TAKE 1 TABLET(0.5 MG) BY MOUTH AT BEDTIME     Not Delegated - Psychiatry:  Anxiolytics/Hypnotics Failed - 02/06/2021  6:25 PM      Failed - This refill cannot be delegated      Failed - Urine Drug Screen completed in last 360 days      Passed - Valid encounter within last 6 months    Recent Outpatient Visits           3 months ago Insomnia, unspecified type   [place], NP   6 months ago IFG (impaired fasting glucose)   [facility] [place], Lauren A, NP   10 months ago Toenail avulsion, initial encounter   [place], DO   10 months ago Insomnia, unspecified type   [place], [place], DO   1 year ago IFG (impaired fasting glucose)   [facility], [place], [place]

## 2021-02-09 NOTE — Telephone Encounter (Signed)
Please see below.

## 2021-02-09 NOTE — Telephone Encounter (Signed)
Patient is overdue for follow up.  Will need an appt for further refills

## 2021-02-10 NOTE — Telephone Encounter (Signed)
Pt scheduled for appt 02/18/2021.

## 2021-02-17 NOTE — Progress Notes (Signed)
BP 124/75   Pulse 66   Ht 5\' 9"  (1.753 m)   Wt 193 lb (87.5 kg)   SpO2 96%   BMI 28.50 kg/m    Subjective:    Patient ID: George Glenn, male    DOB: Aug 28, 1960, 60 y.o.   MRN: [id number]  HPI: George Glenn is a 60 y.o. male presenting on 02/18/2021 for comprehensive medical examination. Current medical complaints include:none  He currently lives with: Interim Problems from his last visit: no  INSOMNIA/MOOD Patient has been off of his respiradone for several years. However, the ativan is used to help with his mood and his sleep.  Currently working well for him.    Denies HA, CP, SOB, dizziness, palpitations, visual changes, and lower extremity swelling.   Depression Screen done today and results listed below:  Depression screen [facility] 2/9 02/18/2021 10/21/2020 03/19/2020 09/18/2019 06/07/2019  Decreased Interest 0 0 0 0 0  Down, Depressed, Hopeless 0 0 0 0 0  PHQ - 2 Score 0 0 0 0 0  Altered sleeping 0 0 - 0 -  Tired, decreased energy 0 1 - 0 -  Change in appetite 0 0 - 0 -  Feeling bad or failure about yourself  0 0 - 0 -  Trouble concentrating 0 0 - 0 -  Moving slowly or fidgety/restless 0 0 - 0 -  Suicidal thoughts 0 0 - 0 -  PHQ-9 Score 0 1 - 0 -  Difficult doing work/chores - Not difficult at all - - -    The patient does not have a history of falls. I did complete a risk assessment for falls. A plan of care for falls was documented.   Past Medical History:  Past Medical History:  Diagnosis Date   Anemia    Barrett's esophagus    Brain damage    Chronic kidney disease    Diabetes mellitus without complication (HCC)    sister states he is no longer diabetic due to diet changes and weight loss   Dizziness    WHEN STANDING   GERD (gastroesophageal reflux disease)    Barrett's   Hyperlipidemia    Hypertension    Hypogonadism in male    Memory loss, short term    Osteoporosis    Paranoid schizophrenia ([place])    Tic    HANDS    Surgical History:  Past  Surgical History:  Procedure Laterality Date   CATARACT EXTRACTION W/PHACO Right 11/12/2014   Procedure: CATARACT EXTRACTION PHACO AND INTRAOCULAR LENS PLACEMENT ([place]);  Surgeon: Birder Robson, MD;  Location: [facility] ORS;  Service: Ophthalmology;  Laterality: Right;  cassette Lot # [id number]     [place]  00:45 AP 14.7 CDE 6.62   CATARACT EXTRACTION W/PHACO Left 12/10/2014   Procedure: CATARACT EXTRACTION PHACO AND INTRAOCULAR LENS PLACEMENT ([place]);  Surgeon: Birder Robson, MD;  Location: [facility] ORS;  Service: Ophthalmology;  Laterality: Left;  US:00:46.1 AP:20.0 CDE:9.22 LOT PACK #[id number]   COLONOSCOPY     ESOPHAGOGASTRODUODENOSCOPY (EGD) WITH PROPOFOL N/A 04/23/2016   Procedure: ESOPHAGOGASTRODUODENOSCOPY (EGD) WITH PROPOFOL;  Surgeon: [name], MD;  Location: [facility] ENDOSCOPY;  Service: Endoscopy;  Laterality: N/A;    Medications:  Current Outpatient Medications on File Prior to Visit  Medication Sig   Cyanocobalamin (VITAMIN B 12 PO) Take 1 tablet by mouth daily.    Docusate Calcium (STOOL SOFTENER PO) Take 1 capsule by mouth daily as needed.   No current facility-administered medications on file prior to visit.  Allergies:  No Known Allergies  Social History:  Social History   Socioeconomic History   Marital status: Single    Spouse name: Not on file   Number of children: Not on file   Years of education: Not on file   Highest education level: Not on file  Occupational History   Occupation: disability   Tobacco Use   Smoking status: Never   Smokeless tobacco: Never  Vaping Use   Vaping Use: Never used  Substance and Sexual Activity   Alcohol use: No    Alcohol/week: 0.0 standard drinks   Drug use: No   Sexual activity: Never  Other Topics Concern   Not on file  Social History Narrative   Sister is legal guardian    Social Determinants of Health   Financial Resource Strain: Not on file  Food Insecurity: Not on file  Transportation Needs: Not on file  Physical  Activity: Not on file  Stress: Not on file  Social Connections: Not on file  Intimate Partner Violence: Not on file   Social History   Tobacco Use  Smoking Status Never  Smokeless Tobacco Never   Social History   Substance and Sexual Activity  Alcohol Use No   Alcohol/week: 0.0 standard drinks    Family History:  Family History  Problem Relation Age of Onset   Hypertension Father    Diabetes Father    Heart disease Father    Hyperlipidemia Father    Cancer Mother        Skin   Dementia Mother    Diabetes Brother    Stroke Maternal Grandmother    Stroke Maternal Grandfather    Heart disease Paternal Grandmother    Heart disease Paternal Grandfather     Past medical history, surgical history, medications, allergies, family history and social history reviewed with patient today and changes made to appropriate areas of the chart.   Review of Systems  Eyes:  Negative for blurred vision and double vision.  Respiratory:  Negative for shortness of breath.   Cardiovascular:  Negative for chest pain, palpitations and leg swelling.  Neurological:  Negative for dizziness and headaches.  All other ROS negative except what is listed above and in the HPI.      Objective:    BP 124/75   Pulse 66   Ht 5\' 9"  (1.753 m)   Wt 193 lb (87.5 kg)   SpO2 96%   BMI 28.50 kg/m   Wt Readings from Last 3 Encounters:  02/18/21 193 lb (87.5 kg)  10/21/20 189 lb 4 oz (85.8 kg)  07/18/20 184 lb 12.8 oz (83.8 kg)    Physical Exam Vitals and nursing note reviewed.  Constitutional:      General: He is not in acute distress.    Appearance: Normal appearance. He is not ill-appearing, toxic-appearing or diaphoretic.  HENT:     Head: Normocephalic.     Right Ear: Tympanic membrane, ear canal and external ear normal.     Left Ear: Tympanic membrane, ear canal and external ear normal.     Nose: Nose normal. No congestion or rhinorrhea.     Mouth/Throat:     Mouth: Mucous membranes are  moist.  Eyes:     General:        Right eye: No discharge.        Left eye: No discharge.     Extraocular Movements: Extraocular movements intact.     Conjunctiva/sclera: Conjunctivae normal.  Pupils: Pupils are equal, round, and reactive to light.  Cardiovascular:     Rate and Rhythm: Normal rate and regular rhythm.     Heart sounds: No murmur heard. Pulmonary:     Effort: Pulmonary effort is normal. No respiratory distress.     Breath sounds: Normal breath sounds. No wheezing, rhonchi or rales.  Abdominal:     General: Abdomen is flat. Bowel sounds are normal. There is no distension.     Palpations: Abdomen is soft.     Tenderness: There is no abdominal tenderness. There is no guarding.  Musculoskeletal:     Cervical back: Normal range of motion and neck supple.  Skin:    General: Skin is warm and dry.     Capillary Refill: Capillary refill takes less than 2 seconds.  Neurological:     General: No focal deficit present.     Mental Status: He is alert and oriented to person, place, and time.     Cranial Nerves: No cranial nerve deficit.     Motor: No weakness.     Deep Tendon Reflexes: Reflexes normal.  Psychiatric:        Mood and Affect: Mood normal.        Behavior: Behavior normal.        Thought Content: Thought content normal.        Judgment: Judgment normal.    Results for orders placed or performed in visit on 02/18/21  Urinalysis, Routine w reflex microscopic  Result Value Ref Range   Specific Gravity, UA >1.030 (H) 1.005 - 1.030   pH, UA 5.5 5.0 - 7.5   Color, UA Yellow Yellow   Appearance Ur Clear Clear   Leukocytes,UA Negative Negative   Protein,UA Negative Negative/Trace   Glucose, UA Negative Negative   Ketones, UA Negative Negative   RBC, UA Negative Negative   Bilirubin, UA Negative Negative   Urobilinogen, Ur 1.0 0.2 - 1.0 mg/dL   Nitrite, UA Negative Negative      Assessment & Plan:   Problem List Items Addressed This Visit        Endocrine   IFG (impaired fasting glucose)    Labs ordered today. Will make recommendations based on lab results.       Relevant Orders   HgB A1c   Microalbumin, Urine Waived     Genitourinary   CKD (chronic kidney disease)    Chronic. Controlled.  Labs ordered today.         Other   Paranoid schizophrenia ([place])    Chronic. Controlled without medication.  Patient does take Lorazepam at bedtime to help with sleep and his mood.  Working well for him.  Discussion had with him and his sister, Clovia Cuff, who is his legal guardian the risks involved taking long term Benzodiazepines. Okay with risks and would like to continue.  PDMP reviewed. Refill sent today.       Hyperlipidemia    Chronic.  Controlled.  Continue with current medication regimen of Simvastatin 10mg .  Labs ordered today.  Return to clinic in 6 months for reevaluation.  Call sooner if concerns arise.        Relevant Medications   simvastatin (ZOCOR) 10 MG tablet   Insomnia    Chronic.  Controlled. Patient does take Lorazepam at bedtime to help with sleep and his mood.  Working well for him.  Discussion had with him and his sister, Clovia Cuff, who is his legal guardian the risks involved taking long  term Benzodiazepines. Okay with risks and would like to continue.  PDMP reviewed. Refill sent today.       Other Visit Diagnoses     Annual physical exam    -  Primary   Health maintenance reviewed during visit. Labs ordered. Declines flu shot, pneumonia vaccine and shingles vaccine. Will think about Colonoscopy.   Relevant Orders   TSH   PSA   Lipid panel   CBC with Differential/Platelet   Comprehensive metabolic panel   Urinalysis, Routine w reflex microscopic (Completed)        Discussed aspirin prophylaxis for myocardial infarction prevention and decision was it was not indicated  LABORATORY TESTING:  Health maintenance labs ordered today as discussed above.   The natural history of prostate cancer  and ongoing controversy regarding screening and potential treatment outcomes of prostate cancer has been discussed with the patient. The meaning of a false positive PSA and a false negative PSA has been discussed. He indicates understanding of the limitations of this screening test and wishes to proceed with screening PSA testing.   IMMUNIZATIONS:   - Tdap: Tetanus vaccination status reviewed: Unable to give due to having Medicare. - Influenza: Refused - Pneumovax: Up to date - Prevnar: Not applicable - HPV: Not applicable - Zostavax vaccine:  Discussed at visit today.   SCREENING: - Colonoscopy:  Discussed at visit today. Will make decision and let me know.   Discussed with patient purpose of the colonoscopy is to detect colon cancer at curable precancerous or early stages   - AAA Screening: Not applicable  -Hearing Test: Not applicable  -Spirometry: Not applicable   PATIENT COUNSELING:    Sexuality: Discussed sexually transmitted diseases, partner selection, use of condoms, avoidance of unintended pregnancy  and contraceptive alternatives.   Advised to avoid cigarette smoking.  I discussed with the patient that most people either abstain from alcohol or drink within safe limits (<=14/week and <=4 drinks/occasion for males, <=7/weeks and <= 3 drinks/occasion for females) and that the risk for alcohol disorders and other health effects rises proportionally with the number of drinks per week and how often a drinker exceeds daily limits.  Discussed cessation/primary prevention of drug use and availability of treatment for abuse.   Diet: Encouraged to adjust caloric intake to maintain  or achieve ideal body weight, to reduce intake of dietary saturated fat and total fat, to limit sodium intake by avoiding high sodium foods and not adding table salt, and to maintain adequate dietary potassium and calcium preferably from fresh fruits, vegetables, and low-fat dairy products.    stressed the  importance of regular exercise  Injury prevention: Discussed safety belts, safety helmets, smoke detector, smoking near bedding or upholstery.   Dental health: Discussed importance of regular tooth brushing, flossing, and dental visits.   Follow up plan: NEXT PREVENTATIVE PHYSICAL DUE IN 1 YEAR. Return in about 6 months (around 08/18/2021) for HTN, HLD, DM2 FU.

## 2021-02-18 ENCOUNTER — Other Ambulatory Visit: Payer: Self-pay

## 2021-02-18 ENCOUNTER — Encounter: Payer: Self-pay | Admitting: Nurse Practitioner

## 2021-02-18 ENCOUNTER — Ambulatory Visit (INDEPENDENT_AMBULATORY_CARE_PROVIDER_SITE_OTHER): Payer: Medicare Other | Admitting: Nurse Practitioner

## 2021-02-18 VITALS — BP 124/75 | HR 66 | Ht 69.0 in | Wt 193.0 lb

## 2021-02-18 DIAGNOSIS — R7301 Impaired fasting glucose: Secondary | ICD-10-CM | POA: Diagnosis not present

## 2021-02-18 DIAGNOSIS — R413 Other amnesia: Secondary | ICD-10-CM | POA: Diagnosis not present

## 2021-02-18 DIAGNOSIS — Z0000 Encounter for general adult medical examination without abnormal findings: Secondary | ICD-10-CM | POA: Diagnosis not present

## 2021-02-18 DIAGNOSIS — N181 Chronic kidney disease, stage 1: Secondary | ICD-10-CM

## 2021-02-18 DIAGNOSIS — E782 Mixed hyperlipidemia: Secondary | ICD-10-CM | POA: Diagnosis not present

## 2021-02-18 DIAGNOSIS — G4700 Insomnia, unspecified: Secondary | ICD-10-CM

## 2021-02-18 DIAGNOSIS — Z136 Encounter for screening for cardiovascular disorders: Secondary | ICD-10-CM | POA: Diagnosis not present

## 2021-02-18 DIAGNOSIS — F200 Paranoid schizophrenia: Secondary | ICD-10-CM

## 2021-02-18 LAB — URINALYSIS, ROUTINE W REFLEX MICROSCOPIC
Bilirubin, UA: NEGATIVE
Glucose, UA: NEGATIVE
Ketones, UA: NEGATIVE
Leukocytes,UA: NEGATIVE
Nitrite, UA: NEGATIVE
Protein,UA: NEGATIVE
RBC, UA: NEGATIVE
Specific Gravity, UA: >1.03 — ABNORMAL HIGH (ref 1.005–1.030)
Urobilinogen, Ur: 1 mg/dL (ref 0.2–1.0)
pH, UA: 5.5 (ref 5.0–7.5)

## 2021-02-18 MED ORDER — SIMVASTATIN 10 MG PO TABS: ORAL_TABLET | ORAL | 1 refills | Status: DC

## 2021-02-18 MED ORDER — LORAZEPAM 0.5 MG PO TABS: ORAL_TABLET | ORAL | 2 refills | Status: DC

## 2021-02-18 MED ORDER — MECLIZINE HCL 25 MG PO TABS: ORAL_TABLET | ORAL | 1 refills | Status: DC

## 2021-02-18 MED ORDER — ESOMEPRAZOLE MAGNESIUM 40 MG PO CPDR: DELAYED_RELEASE_CAPSULE | ORAL | 1 refills | Status: DC

## 2021-02-18 NOTE — Assessment & Plan Note (Signed)
Chronic. Controlled.  Labs ordered today.

## 2021-02-18 NOTE — Assessment & Plan Note (Addendum)
Chronic.  Controlled. Patient does take Lorazepam at bedtime to help with sleep and his mood.  Working well for him.  Discussion had with him and his sister, Clovia Cuff, who is his legal guardian the risks involved taking long term Benzodiazepines. Okay with risks and would like to continue.  PDMP reviewed. Refill sent today.

## 2021-02-18 NOTE — Assessment & Plan Note (Addendum)
Chronic. Controlled without medication.  Patient does take Lorazepam at bedtime to help with sleep and his mood.  Working well for him.  Discussion had with him and his sister, Clovia Cuff, who is his legal guardian the risks involved taking long term Benzodiazepines. Okay with risks and would like to continue.  PDMP reviewed. Refill sent today.

## 2021-02-18 NOTE — Assessment & Plan Note (Signed)
Chronic.  Controlled.  Continue with current medication regimen of Simvastatin 10mg.  Labs ordered today.  Return to clinic in 6 months for reevaluation.  Call sooner if concerns arise.   

## 2021-02-18 NOTE — Assessment & Plan Note (Signed)
Labs ordered today.  Will make recommendations based on lab results. ?

## 2021-02-19 LAB — CBC WITH DIFFERENTIAL/PLATELET
Basophils Absolute: 0.1 10*3/uL (ref 0.0–0.2)
Basos: 1 %
EOS (ABSOLUTE): 0.2 10*3/uL (ref 0.0–0.4)
Eos: 3 %
Hematocrit: 51.1 % — ABNORMAL HIGH (ref 37.5–51.0)
Hemoglobin: 16.5 g/dL (ref 13.0–17.7)
Immature Grans (Abs): 0 10*3/uL (ref 0.0–0.1)
Immature Granulocytes: 0 %
Lymphocytes Absolute: 2 10*3/uL (ref 0.7–3.1)
Lymphs: 41 %
MCH: 29.4 pg (ref 26.6–33.0)
MCHC: 32.3 g/dL (ref 31.5–35.7)
MCV: 91 fL (ref 79–97)
Monocytes Absolute: 0.4 10*3/uL (ref 0.1–0.9)
Monocytes: 9 %
Neutrophils Absolute: 2.2 10*3/uL (ref 1.4–7.0)
Neutrophils: 46 %
Platelets: 222 10*3/uL (ref 150–450)
RBC: 5.61 x10E6/uL (ref 4.14–5.80)
RDW: 12.4 % (ref 11.6–15.4)
WBC: 4.8 10*3/uL (ref 3.4–10.8)

## 2021-02-19 LAB — COMPREHENSIVE METABOLIC PANEL
ALT: 44 IU/L (ref 0–44)
AST: 31 IU/L (ref 0–40)
Albumin/Globulin Ratio: 1.8 (ref 1.2–2.2)
Albumin: 4.6 g/dL (ref 3.8–4.8)
Alkaline Phosphatase: 75 IU/L (ref 44–121)
BUN/Creatinine Ratio: 15 (ref 10–24)
BUN: 14 mg/dL (ref 8–27)
Bilirubin Total: 0.4 mg/dL (ref 0.0–1.2)
CO2: 25 mmol/L (ref 20–29)
Calcium: 8.9 mg/dL (ref 8.6–10.2)
Chloride: 101 mmol/L (ref 96–106)
Creatinine, Ser: 0.94 mg/dL (ref 0.76–1.27)
Globulin, Total: 2.5 g/dL (ref 1.5–4.5)
Glucose: 91 mg/dL (ref 70–99)
Potassium: 4.7 mmol/L (ref 3.5–5.2)
Sodium: 141 mmol/L (ref 134–144)
Total Protein: 7.1 g/dL (ref 6.0–8.5)
eGFR: 92 mL/min/{1.73_m2} (ref >59)

## 2021-02-19 LAB — TSH: TSH: 1.09 u[IU]/mL (ref 0.450–4.500)

## 2021-02-19 LAB — HEMOGLOBIN A1C
Est. average glucose Bld gHb Est-mCnc: 126 mg/dL
Hgb A1c MFr Bld: 6 % — ABNORMAL HIGH (ref 4.8–5.6)

## 2021-02-19 LAB — LIPID PANEL
Chol/HDL Ratio: 4.5 ratio (ref 0.0–5.0)
Cholesterol, Total: 134 mg/dL (ref 100–199)
HDL: 30 mg/dL — ABNORMAL LOW (ref >39)
LDL Chol Calc (NIH): 89 mg/dL (ref 0–99)
Triglycerides: 74 mg/dL (ref 0–149)
VLDL Cholesterol Cal: 15 mg/dL (ref 5–40)

## 2021-02-19 LAB — PSA: Prostate Specific Ag, Serum: 0.5 ng/mL (ref 0.0–4.0)

## 2021-02-19 NOTE — Progress Notes (Signed)
Hi Ed and [name]. Ed's lab work looks good. A1c is well controlled at 6.0. No concerns at this time.  Continue with current medication regimen.

## 2021-05-03 ENCOUNTER — Other Ambulatory Visit: Payer: Self-pay | Admitting: Nurse Practitioner

## 2021-05-03 NOTE — Telephone Encounter (Signed)
Requested medication (s) are due for refill today: yes  Requested medication (s) are on the active medication list: yes  Last refill:  02/18/21 #30 2 RF  Future visit scheduled: no  Notes to clinic:  med not delegated to NT to RF   Requested Prescriptions  Pending Prescriptions Disp Refills   LORazepam (ATIVAN) 0.5 MG tablet [Pharmacy Med Name: LORAZEPAM 0.5MG  TABLETS] 30 tablet     Sig: TAKE 1 TABLET BY MOUTH DAILY AT BEDTIME     Not Delegated - Psychiatry:  Anxiolytics/Hypnotics Failed - 05/03/2021  4:07 PM      Failed - This refill cannot be delegated      Failed - Urine Drug Screen completed in last 360 days      Passed - Valid encounter within last 6 months    Recent Outpatient Visits           2 months ago Annual physical exam   [place], NP   6 months ago Insomnia, unspecified type   [facility] Jon Billings, NP   9 months ago IFG (impaired fasting glucose)   [facility] Mc[place], Lauren A, NP   1 year ago Toenail avulsion, initial encounter   [facility] Myles Gip, DO   1 year ago Insomnia, unspecified type   [facility], [place], DO

## 2021-05-04 MED ORDER — LORAZEPAM 0.5 MG PO TABS: ORAL_TABLET | ORAL | 2 refills | Status: DC

## 2021-05-04 NOTE — Telephone Encounter (Signed)
Please reach out to the pharmacy and find out why they aren't able to fill this medication. It shows there should be 2 refills.

## 2021-05-04 NOTE — Telephone Encounter (Signed)
Pts [name] states that the pharmacy stated there was a stop on this medication and they don't understand why. Pt has ran out of medication and there was one refill left on it. Please review.

## 2021-07-03 DIAGNOSIS — M79674 Pain in right toe(s): Secondary | ICD-10-CM | POA: Diagnosis not present

## 2021-07-03 DIAGNOSIS — M79675 Pain in left toe(s): Secondary | ICD-10-CM | POA: Diagnosis not present

## 2021-07-03 DIAGNOSIS — L603 Nail dystrophy: Secondary | ICD-10-CM | POA: Diagnosis not present

## 2021-07-03 DIAGNOSIS — E1142 Type 2 diabetes mellitus with diabetic polyneuropathy: Secondary | ICD-10-CM | POA: Diagnosis not present

## 2021-07-03 DIAGNOSIS — B351 Tinea unguium: Secondary | ICD-10-CM | POA: Diagnosis not present

## 2021-08-14 ENCOUNTER — Other Ambulatory Visit: Payer: Self-pay | Admitting: Nurse Practitioner

## 2021-08-14 NOTE — Telephone Encounter (Signed)
Copied from [facility] [phone]. Topic: General - Other ?>> Aug 14, 2021 10:45 AM Tessa Lerner A wrote: ?Reason for CRM: Medication Refill - Medication: LORazepam (ATIVAN) 0.5 MG tablet [[id number]]  ? ?Has the patient contacted their pharmacy? Yes.  The patient's sister has been directed to contact their PCP ?(Agent: If no, request that the patient contact the pharmacy for the refill. If patient does not wish to contact the pharmacy document the reason why and proceed with request.) ?(Agent: If yes, when and what did the pharmacy advise?) ? ?Preferred Pharmacy (with phone number or street name): [facility] [address], [address] [place] ?[place] [address] ?Phone: [phone] Fax: [phone] ?Hours: Not open 24 hours ? ?Has the patient been seen for an appointment in the last year OR does the patient have an upcoming appointment? Yes.   ? ?Agent: Please be advised that RX refills may take up to 3 business days. We ask that you follow-up with your pharmacy. ?

## 2021-08-14 NOTE — Telephone Encounter (Signed)
Requested medication (s) are due for refill today: Yes ? ?Requested medication (s) are on the active medication list: Yes ? ?Last refill:  05/04/21 ? ?Future visit scheduled: Yes ? ?Notes to clinic:  See request. ? ? ? ?Requested Prescriptions  ?Pending Prescriptions Disp Refills  ? LORazepam (ATIVAN) 0.5 MG tablet 30 tablet 2  ?  Sig: Take daily at bedtime.  ?  ? Not Delegated - Psychiatry: Anxiolytics/Hypnotics 2 Failed - 08/14/2021 11:46 AM  ?  ?  Failed - This refill cannot be delegated  ?  ?  Failed - Urine Drug Screen completed in last 360 days  ?  ?  Passed - Patient is not pregnant  ?  ?  Passed - Valid encounter within last 6 months  ?  Recent Outpatient Visits   ? ?      ? 5 months ago Annual physical exam  ? [place], NP  ? 9 months ago Insomnia, unspecified type  ? [place], NP  ? 1 year ago IFG (impaired fasting glucose)  ? [place], NP  ? 1 year ago Toenail avulsion, initial encounter  ? [place], DO  ? 1 year ago Insomnia, unspecified type  ? [place], [place] P, DO  ? ?  ?  ?Future Appointments   ? ?        ? In 4 days Jon Billings, NP [facility]  ? ?  ? ? ?  ?  ?  ? ?

## 2021-08-17 MED ORDER — LORAZEPAM 0.5 MG PO TABS: ORAL_TABLET | ORAL | 2 refills | Status: DC

## 2021-08-17 NOTE — Progress Notes (Signed)
? ?Established Patient Office Visit ? ?Subjective:  ?Patient ID: George Glenn, male    DOB: 04/21/1960  Age: 61 y.o. MRN: [id number] ? ?CC:  ?Chief Complaint  ?Patient presents with  ? Hyperlipidemia  ?  Pt would like refill on meclizine.   ? Insomnia  ? Schizophrenia  ? ? ?HPI ?George Glenn presents for follow-up with hyperlipidemia and insomnia. His sister says that he is doing well.   ? ?HYPERLIPIDEMIA ?Hyperlipidemia status: chronic ?Satisfied with current treatment?  yes ?Side effects:  no ?Medication compliance: excellent compliance ?Past cholesterol meds: simvastatin (zocor) ?Supplements: none ?Aspirin:  no ?The 10-year ASCVD risk score (Arnett DK, et al., 2019) is: 20.5% ?  Values used to calculate the score: ?    Age: 75 years ?    Sex: Male ?    Is Non-Hispanic African American: No ?    Diabetic: Yes ?    Tobacco smoker: No ?    Systolic Blood Pressure: 591 mmHg ?    Is BP treated: No ?    HDL Cholesterol: 30 mg/dL ?    Total Cholesterol: 134 mg/dL ?Chest pain:  no ? ?ANEMIA ?Anemia status: controlled ?Etiology of anemia: ?Duration of anemia treatment:  ?Compliance with treatment: excellent compliance ?Iron supplementation side effects: no ?Severity of anemia: mild ?Fatigue: no ?Decreased exercise tolerance: no  ?Dyspnea on exertion: no ?Palpitations: no ?Bleeding: no ?Pica: no ? ? ?INSOMNIA/MOOD ? ?Doing well with lorazepam. No side effects.  ? ?Past Medical History:  ?Diagnosis Date  ? Anemia   ? Barrett's esophagus   ? Brain damage   ? Chronic kidney disease   ? Diabetes mellitus without complication ([place])   ? sister states he is no longer diabetic due to diet changes and weight loss  ? Dizziness   ? WHEN STANDING  ? GERD (gastroesophageal reflux disease)   ? Barrett's  ? Hyperlipidemia   ? Hypertension   ? Hypogonadism in male   ? Memory loss, short term   ? Osteoporosis   ? Paranoid schizophrenia ([place])   ? Tic   ? HANDS  ? ? ?Past Surgical History:  ?Procedure Laterality Date  ? CATARACT  EXTRACTION W/PHACO Right 11/12/2014  ? Procedure: CATARACT EXTRACTION PHACO AND INTRAOCULAR LENS PLACEMENT (IOC);  Surgeon: Birder Robson, MD;  Location: [facility];  Service: Ophthalmology;  Laterality: Right;  cassette Lot # [id number]     [place]  00:45 AP 14.7 CDE 6.62  ? CATARACT EXTRACTION W/PHACO Left 12/10/2014  ? Procedure: CATARACT EXTRACTION PHACO AND INTRAOCULAR LENS PLACEMENT (IOC);  Surgeon: Birder Robson, MD;  Location: [facility];  Service: Ophthalmology;  Laterality: Left;  US:00:46.1 ?AP:20.0 ?CDE:9.22 ?LOT PACK #[id number]  ? COLONOSCOPY    ? ESOPHAGOGASTRODUODENOSCOPY (EGD) WITH PROPOFOL N/A 04/23/2016  ? Procedure: ESOPHAGOGASTRODUODENOSCOPY (EGD) WITH PROPOFOL;  Surgeon: [name], MD;  Location: [facility];  Service: Endoscopy;  Laterality: N/A;  ? ? ?Family History  ?Problem Relation Age of Onset  ? Hypertension Father   ? Diabetes Father   ? Heart disease Father   ? Hyperlipidemia Father   ? Cancer Mother   ?     Skin  ? Dementia Mother   ? Diabetes Brother   ? Stroke Maternal Grandmother   ? Stroke Maternal Grandfather   ? Heart disease Paternal Grandmother   ? Heart disease Paternal Grandfather   ? ? ?Social History  ? ?Socioeconomic History  ? Marital status: Single  ?  Spouse name: Not on file  ? Number of  children: Not on file  ? Years of education: Not on file  ? Highest education level: Not on file  ?Occupational History  ? Occupation: disability   ?Tobacco Use  ? Smoking status: Never  ? Smokeless tobacco: Never  ?Vaping Use  ? Vaping Use: Never used  ?Substance and Sexual Activity  ? Alcohol use: No  ?  Alcohol/week: 0.0 standard drinks  ? Drug use: No  ? Sexual activity: Never  ?Other Topics Concern  ? Not on file  ?Social History Narrative  ? Sister is legal guardian   ? ?Social Determinants of Health  ? ?Financial Resource Strain: Not on file  ?Food Insecurity: Not on file  ?Transportation Needs: Not on file  ?Physical Activity: Not on file  ?Stress: Not on file  ?Social Connections:  Not on file  ?Intimate Partner Violence: Not on file  ? ? ?Outpatient Medications Prior to Visit  ?Medication Sig Dispense Refill  ? Cyanocobalamin (VITAMIN B 12 PO) Take 1 tablet by mouth daily.     ? Docusate Calcium (STOOL SOFTENER PO) Take 1 capsule by mouth daily as needed.    ? esomeprazole (NEXIUM) 40 MG capsule TAKE 1 CAPSULE(40 MG) BY MOUTH DAILY 90 capsule 1  ? LORazepam (ATIVAN) 0.5 MG tablet Take daily at bedtime. 30 tablet 2  ? simvastatin (ZOCOR) 10 MG tablet TAKE 1 TABLET(10 MG) BY MOUTH DAILY 90 tablet 1  ? meclizine (ANTIVERT) 25 MG tablet Take as needed for dizziness. 90 tablet 1  ? ?No facility-administered medications prior to visit.  ? ? ?No Known Allergies ? ?ROS ?Review of Systems  ?Constitutional: Negative.   ?HENT: Negative.    ?Eyes: Negative.   ?Respiratory: Negative.    ?Cardiovascular: Negative.   ?Endocrine: Negative.   ?Genitourinary: Negative.   ?Musculoskeletal: Negative.   ?Neurological: Negative.   ?Psychiatric/Behavioral: Negative.    ? ?  ?Objective:  ?  ?Physical Exam ?Vitals and nursing note reviewed.  ?Constitutional:   ?   Appearance: Normal appearance.  ?HENT:  ?   Head: Normocephalic.  ?Eyes:  ?   Conjunctiva/sclera: Conjunctivae normal.  ?Cardiovascular:  ?   Rate and Rhythm: Normal rate and regular rhythm.  ?   Pulses: Normal pulses.  ?   Heart sounds: Normal heart sounds.  ?Pulmonary:  ?   Effort: Pulmonary effort is normal.  ?   Breath sounds: Normal breath sounds.  ?Musculoskeletal:  ?   Cervical back: Normal range of motion.  ?Skin: ?   General: Skin is warm and dry.  ?Neurological:  ?   Mental Status: He is alert. He is disoriented.  ?Psychiatric:  ?   Comments: Slow to respond to questions. Flat affect  ? ? ?BP (!) 142/90   Pulse 60   Temp 98.1 ?F (36.7 ?C) (Oral)   Wt 185 lb 9.6 oz (84.2 kg)   SpO2 98%   BMI 27.41 kg/m?  ?Wt Readings from Last 3 Encounters:  ?08/18/21 185 lb 9.6 oz (84.2 kg)  ?02/18/21 193 lb (87.5 kg)  ?10/21/20 189 lb 4 oz (85.8 kg)   ? ? ?Lab Results  ?Component Value Date  ? WBC 4.8 02/18/2021  ? HGB 16.5 02/18/2021  ? HCT 51.1 (H) 02/18/2021  ? MCV 91 02/18/2021  ? PLT 222 02/18/2021  ? ?Lab Results  ?Component Value Date  ? NA 141 02/18/2021  ? K 4.7 02/18/2021  ? CO2 25 02/18/2021  ? GLUCOSE 91 02/18/2021  ? BUN 14 02/18/2021  ? CREATININE 0.94  02/18/2021  ? BILITOT 0.4 02/18/2021  ? ALKPHOS 75 02/18/2021  ? AST 31 02/18/2021  ? ALT 44 02/18/2021  ? PROT 7.1 02/18/2021  ? ALBUMIN 4.6 02/18/2021  ? CALCIUM 8.9 02/18/2021  ? EGFR 92 02/18/2021  ? ?Lab Results  ?Component Value Date  ? CHOL 134 02/18/2021  ? ?Lab Results  ?Component Value Date  ? HDL 30 (L) 02/18/2021  ? ?Lab Results  ?Component Value Date  ? [place] II 89 02/18/2021  ? ?Lab Results  ?Component Value Date  ? TRIG 74 02/18/2021  ? ? ?Lab Results  ?Component Value Date  ? HGBA1C 6.0 (H) 02/18/2021  ? ? ?  ?Assessment & Plan:  ? ?Problem List Items Addressed This Visit   ? ?  ? Digestive  ? Barrett's esophagus  ?  Chronic. Has not seen GI in several years.  Will place new referral for patient to have reevaluation. ? ?  ?  ? Relevant Orders  ? Ambulatory referral to Gastroenterology  ?  ? Endocrine  ? IFG (impaired fasting glucose)  ?  Labs ordered today. Will make recommendations based on lab results.  ? ?  ?  ? Relevant Orders  ? HgB A1c  ?  ? Genitourinary  ? CKD (chronic kidney disease)  ?  Labs ordered today. Will make recommendations based on lab results.  ? ?  ?  ? Relevant Orders  ? Comp Met (CMET)  ?  ? Other  ? Paranoid schizophrenia ([place]) - Primary  ?  Chronic. Controlled without medication.  Patient does take Lorazepam at bedtime to help with sleep and his mood.  Working well for him.  Discussion had with him and his sister, Clovia Cuff, who is his legal guardian the risks involved taking long term Benzodiazepines. Okay with risks and would like to continue.    ? ?  ?  ? Relevant Orders  ? Comp Met (CMET)  ? Anemia  ?  Controlled.  Does not report any symptoms.  Will  check labs at visit today. Will make recommendations based on lab results. ? ?  ?  ? Relevant Orders  ? CBC w/Diff  ? Hyperlipidemia  ?  Chronic.  Controlled.  Continue with current medication regimen of Simvastatin 71m.

## 2021-08-18 ENCOUNTER — Encounter: Payer: Self-pay | Admitting: Nurse Practitioner

## 2021-08-18 ENCOUNTER — Ambulatory Visit (INDEPENDENT_AMBULATORY_CARE_PROVIDER_SITE_OTHER): Payer: Medicare Other | Admitting: Nurse Practitioner

## 2021-08-18 VITALS — BP 142/90 | HR 60 | Temp 98.1°F | Wt 185.6 lb

## 2021-08-18 DIAGNOSIS — R7301 Impaired fasting glucose: Secondary | ICD-10-CM

## 2021-08-18 DIAGNOSIS — D649 Anemia, unspecified: Secondary | ICD-10-CM

## 2021-08-18 DIAGNOSIS — K2270 Barrett's esophagus without dysplasia: Secondary | ICD-10-CM | POA: Diagnosis not present

## 2021-08-18 DIAGNOSIS — N181 Chronic kidney disease, stage 1: Secondary | ICD-10-CM

## 2021-08-18 DIAGNOSIS — F200 Paranoid schizophrenia: Secondary | ICD-10-CM | POA: Diagnosis not present

## 2021-08-18 DIAGNOSIS — R030 Elevated blood-pressure reading, without diagnosis of hypertension: Secondary | ICD-10-CM

## 2021-08-18 DIAGNOSIS — E782 Mixed hyperlipidemia: Secondary | ICD-10-CM | POA: Diagnosis not present

## 2021-08-18 MED ORDER — MECLIZINE HCL 25 MG PO TABS: ORAL_TABLET | ORAL | 1 refills | Status: DC

## 2021-08-18 NOTE — Assessment & Plan Note (Signed)
Chronic. Has not seen GI in several years.  Will place new referral for patient to have reevaluation. ?

## 2021-08-18 NOTE — Assessment & Plan Note (Signed)
Chronic.  Controlled.  Continue with current medication regimen of Simvastatin 10mg.  Labs ordered today.  Return to clinic in 6 months for reevaluation.  Call sooner if concerns arise.   

## 2021-08-18 NOTE — Assessment & Plan Note (Signed)
Labs ordered today.  Will make recommendations based on lab results. ?

## 2021-08-18 NOTE — Assessment & Plan Note (Signed)
Controlled.  Does not report any symptoms.  Will check labs at visit today. Will make recommendations based on lab results. ?

## 2021-08-18 NOTE — Assessment & Plan Note (Signed)
Chronic. Controlled without medication.  Patient does take Lorazepam at bedtime to help with sleep and his mood.  Working well for him.  Discussion had with him and his sister, Clovia Cuff, who is his legal guardian the risks involved taking long term Benzodiazepines. Okay with risks and would like to continue.    ?

## 2021-08-19 ENCOUNTER — Encounter: Payer: Self-pay | Admitting: Gastroenterology

## 2021-08-19 ENCOUNTER — Ambulatory Visit (INDEPENDENT_AMBULATORY_CARE_PROVIDER_SITE_OTHER): Payer: Medicare Other | Admitting: Gastroenterology

## 2021-08-19 ENCOUNTER — Other Ambulatory Visit: Payer: Self-pay

## 2021-08-19 VITALS — BP 120/82 | HR 98 | Temp 97.8°F | Wt 187.0 lb

## 2021-08-19 DIAGNOSIS — K2270 Barrett's esophagus without dysplasia: Secondary | ICD-10-CM

## 2021-08-19 DIAGNOSIS — Z1211 Encounter for screening for malignant neoplasm of colon: Secondary | ICD-10-CM | POA: Diagnosis not present

## 2021-08-19 LAB — CBC WITH DIFFERENTIAL/PLATELET
Basophils Absolute: 0.1 10*3/uL (ref 0.0–0.2)
Basos: 1 %
EOS (ABSOLUTE): 0.1 10*3/uL (ref 0.0–0.4)
Eos: 2 %
Hematocrit: 48.6 % (ref 37.5–51.0)
Hemoglobin: 16.4 g/dL (ref 13.0–17.7)
Immature Grans (Abs): 0 10*3/uL (ref 0.0–0.1)
Immature Granulocytes: 0 %
Lymphocytes Absolute: 2 10*3/uL (ref 0.7–3.1)
Lymphs: 42 %
MCH: 28.9 pg (ref 26.6–33.0)
MCHC: 33.7 g/dL (ref 31.5–35.7)
MCV: 86 fL (ref 79–97)
Monocytes Absolute: 0.3 10*3/uL (ref 0.1–0.9)
Monocytes: 7 %
Neutrophils Absolute: 2.2 10*3/uL (ref 1.4–7.0)
Neutrophils: 48 %
Platelets: 203 10*3/uL (ref 150–450)
RBC: 5.68 x10E6/uL (ref 4.14–5.80)
RDW: 14 % (ref 11.6–15.4)
WBC: 4.6 10*3/uL (ref 3.4–10.8)

## 2021-08-19 LAB — COMPREHENSIVE METABOLIC PANEL WITH GFR
ALT: 35 [IU]/L (ref 0–44)
AST: 28 [IU]/L (ref 0–40)
Albumin/Globulin Ratio: 1.8 (ref 1.2–2.2)
Albumin: 4.4 g/dL (ref 3.8–4.8)
Alkaline Phosphatase: 96 [IU]/L (ref 44–121)
BUN/Creatinine Ratio: 21 (ref 10–24)
BUN: 19 mg/dL (ref 8–27)
Bilirubin Total: 0.5 mg/dL (ref 0.0–1.2)
CO2: 26 mmol/L (ref 20–29)
Calcium: 8.8 mg/dL (ref 8.6–10.2)
Chloride: 102 mmol/L (ref 96–106)
Creatinine, Ser: 0.91 mg/dL (ref 0.76–1.27)
Globulin, Total: 2.5 g/dL (ref 1.5–4.5)
Glucose: 95 mg/dL (ref 70–99)
Potassium: 4.2 mmol/L (ref 3.5–5.2)
Sodium: 141 mmol/L (ref 134–144)
Total Protein: 6.9 g/dL (ref 6.0–8.5)
eGFR: 96 mL/min/{1.73_m2}

## 2021-08-19 LAB — HEMOGLOBIN A1C
Est. average glucose Bld gHb Est-mCnc: 128 mg/dL
Hgb A1c MFr Bld: 6.1 % — ABNORMAL HIGH (ref 4.8–5.6)

## 2021-08-19 LAB — LIPID PANEL
Chol/HDL Ratio: 4 ratio (ref 0.0–5.0)
Cholesterol, Total: 108 mg/dL (ref 100–199)
HDL: 27 mg/dL — ABNORMAL LOW (ref >39)
LDL Chol Calc (NIH): 67 mg/dL (ref 0–99)
Triglycerides: 61 mg/dL (ref 0–149)
VLDL Cholesterol Cal: 14 mg/dL (ref 5–40)

## 2021-08-19 MED ORDER — NA SULFATE-K SULFATE-MG SULF 17.5-3.13-1.6 GM/177ML PO SOLN: 354.0000 mL | Freq: Once | ORAL | 0 refills | Status: AC

## 2021-08-19 NOTE — Progress Notes (Signed)
?  ?[name] MD, MRCP(U.K) ?[address]  ?[address]  ?[place], [place] [address]  ?Main: [phone]  ?Fax: [phone] ? ? ?Gastroenterology Consultation ? ?Referring Provider:     Jon Billings, NP ?Primary Care Physician:  Jon Billings, NP ?Primary Gastroenterologist:  Dr. [name]  ?Reason for Consultation:  Barrettes esophagus ?      ? HPI:   ?George Glenn is a 61 y.o. y/o male referred for consultation & management  by Jon Billings, NP.   ? ?He has been referred to discuss about screening for Barrett's esophagus.  I performed an upper endoscopy in January 2018 on him.  At that point of time it was performed for surveillance due to personal history of Barrett's esophagus.  During the procedure the patient developed significant hypoxia and the procedure could not become pleated.  The patient had a large type II paraesophageal hernia.  Limited exam due to desaturation.  Salmon-colored mucosa was seen suggestive of long segment of Barrett's esophagus but no specimens were collected.  Plan was to come back and see me in the office but he was lost to follow-up.  Further evaluation at that point of time we discussed would include intubation if we need to perform the procedure yet again.  Recently he was scheduled for a colonoscopy in July 2022 but it was canceled. ? ?He is today here with his sister.  Willing to undergo a colonoscopy for colon cancer screening as well as endoscopy for Barrett's esophagus evaluation ? ?Past Medical History:  ?Diagnosis Date  ? Anemia   ? Barrett's esophagus   ? Brain damage   ? Chronic kidney disease   ? Diabetes mellitus without complication ([place])   ? sister states he is no longer diabetic due to diet changes and weight loss  ? Dizziness   ? WHEN STANDING  ? GERD (gastroesophageal reflux disease)   ? Barrett's  ? Hyperlipidemia   ? Hypertension   ? Hypogonadism in male   ? Memory loss, short term   ? Osteoporosis   ? Paranoid schizophrenia ([place])   ? Tic    ? HANDS  ? ? ?Past Surgical History:  ?Procedure Laterality Date  ? CATARACT EXTRACTION W/PHACO Right 11/12/2014  ? Procedure: CATARACT EXTRACTION PHACO AND INTRAOCULAR LENS PLACEMENT (IOC);  Surgeon: Birder Robson, MD;  Location: [facility];  Service: Ophthalmology;  Laterality: Right;  cassette Lot # [id number]     [place]  00:45 AP 14.7 CDE 6.62  ? CATARACT EXTRACTION W/PHACO Left 12/10/2014  ? Procedure: CATARACT EXTRACTION PHACO AND INTRAOCULAR LENS PLACEMENT (IOC);  Surgeon: Birder Robson, MD;  Location: [facility];  Service: Ophthalmology;  Laterality: Left;  US:00:46.1 ?AP:20.0 ?CDE:9.22 ?LOT PACK #[id number]  ? COLONOSCOPY    ? ESOPHAGOGASTRODUODENOSCOPY (EGD) WITH PROPOFOL N/A 04/23/2016  ? Procedure: ESOPHAGOGASTRODUODENOSCOPY (EGD) WITH PROPOFOL;  Surgeon: [name], MD;  Location: [facility];  Service: Endoscopy;  Laterality: N/A;  ? ? ?Prior to Admission medications   ?Medication Sig Start Date End Date Taking? Authorizing Provider  ?Cyanocobalamin (VITAMIN B 12 PO) Take 1 tablet by mouth daily.     [provider]  ?Docusate Calcium (STOOL SOFTENER PO) Take 1 capsule by mouth daily as needed.    [provider]  ?esomeprazole (NEXIUM) 40 MG capsule TAKE 1 CAPSULE(40 MG) BY MOUTH DAILY 02/18/21   Jon Billings, NP  ?LORazepam (ATIVAN) 0.5 MG tablet Take daily at bedtime. 08/17/21   Jon Billings, NP  ?meclizine (ANTIVERT) 25 MG tablet Take as  needed for dizziness. 08/18/21   Jon Billings, NP  ?simvastatin (ZOCOR) 10 MG tablet TAKE 1 TABLET(10 MG) BY MOUTH DAILY 02/18/21   Jon Billings, NP  ? ? ?Family History  ?Problem Relation Age of Onset  ? Hypertension Father   ? Diabetes Father   ? Heart disease Father   ? Hyperlipidemia Father   ? Cancer Mother   ?     Skin  ? Dementia Mother   ? Diabetes Brother   ? Stroke Maternal Grandmother   ? Stroke Maternal Grandfather   ? Heart disease Paternal Grandmother   ? Heart disease Paternal Grandfather   ?  ? ?Social History  ? ?Tobacco  Use  ? Smoking status: Never  ? Smokeless tobacco: Never  ?Vaping Use  ? Vaping Use: Never used  ?Substance Use Topics  ? Alcohol use: No  ?  Alcohol/week: 0.0 standard drinks  ? Drug use: No  ? ? ?Allergies as of 08/19/2021  ? (No Known Allergies)  ? ? ?Review of Systems:    ?All systems reviewed and negative except where noted in HPI. ? ? Physical Exam:  ?BP 120/82   Pulse 98   Temp 97.8 ?F (36.6 ?C) (Oral)   Wt 187 lb (84.8 kg)   BMI 27.62 kg/m?  ?No LMP for male patient. ?Psych:  Alert and cooperative. Normal mood and affect. ?General:   Alert,  Well-developed, well-nourished, pleasant and cooperative in NAD ?Head:  Normocephalic and atraumatic. ?Eyes:  Sclera clear, no icterus.   Conjunctiva pink. ?Ears:  Normal auditory acuity. ?Neurologic:  Alert and oriented x3;  grossly normal neurologically. ?Psych:  Alert and cooperative. Normal mood and affect. ? ?Imaging Studies: ?No results found. ? ?Assessment and Plan:  ? ?George Glenn is a 61 y.o. y/o male who has previously carried a diagnosis of Barrett's esophagus.  In 2018 I performed an upper endoscopy with the aim to take biopsies for Barrett's esophagus but he desaturated during the procedure and has a high large paraesophageal hernia hence the procedure had to be aborted.  The plan was discussed further steps at the office but he was lost to follow-up.  Today he returns to establish care again.  Discussed about the risks of sedation and prior history of desaturation.  Explained to him that if he with feels to proceed with colonoscopy for colon cancer screening as well as evaluation for Barrett's esophagus then he would need to be intubated.  Discussed the risks of intubation and himself and his sister who is healthcare proxy decided to proceed with the procedures. ? ? ?I have discussed alternative options, risks & benefits,  which include, but are not limited to, bleeding, infection, perforation,respiratory complication & drug reaction.  The patient  agrees with this plan & written consent will be obtained.   ? ? ? ? ?Follow up in as needed ? ?Dr [name] MD,MRCP(U.K) ? ?

## 2021-08-19 NOTE — Progress Notes (Signed)
Please let patient's sister, [name], know that his lab work looks good.  A1c is 6.1 which is still in the prediabetic range.  His liver, kidneys, and electrolytes look good.  Cholesterol also looks good.  No concerns at this time.  Follow up as discussed.

## 2021-08-25 ENCOUNTER — Encounter: Payer: Self-pay | Admitting: Gastroenterology

## 2021-08-26 ENCOUNTER — Encounter: Payer: Self-pay | Admitting: Gastroenterology

## 2021-08-27 ENCOUNTER — Ambulatory Visit: Payer: Medicare Other | Admitting: Anesthesiology

## 2021-08-27 ENCOUNTER — Encounter
Admission: RE | Disposition: A | Discharge status: Home / Self Care | Payer: Self-pay | Source: Home / Self Care | Attending: Gastroenterology

## 2021-08-27 ENCOUNTER — Ambulatory Visit
Admission: RE | Admit: 2021-08-27 | Discharge: 2021-08-27 | Disposition: A | Discharge status: Home / Self Care | Payer: Medicare Other | Attending: Gastroenterology | Admitting: Gastroenterology

## 2021-08-27 ENCOUNTER — Encounter: Payer: Self-pay | Admitting: Gastroenterology

## 2021-08-27 DIAGNOSIS — K635 Polyp of colon: Secondary | ICD-10-CM | POA: Diagnosis not present

## 2021-08-27 DIAGNOSIS — K219 Gastro-esophageal reflux disease without esophagitis: Secondary | ICD-10-CM | POA: Insufficient documentation

## 2021-08-27 DIAGNOSIS — K2270 Barrett's esophagus without dysplasia: Secondary | ICD-10-CM | POA: Insufficient documentation

## 2021-08-27 DIAGNOSIS — I129 Hypertensive chronic kidney disease with stage 1 through stage 4 chronic kidney disease, or unspecified chronic kidney disease: Secondary | ICD-10-CM | POA: Diagnosis not present

## 2021-08-27 DIAGNOSIS — F200 Paranoid schizophrenia: Secondary | ICD-10-CM | POA: Insufficient documentation

## 2021-08-27 DIAGNOSIS — Z1211 Encounter for screening for malignant neoplasm of colon: Secondary | ICD-10-CM | POA: Insufficient documentation

## 2021-08-27 DIAGNOSIS — D122 Benign neoplasm of ascending colon: Secondary | ICD-10-CM | POA: Diagnosis not present

## 2021-08-27 DIAGNOSIS — N189 Chronic kidney disease, unspecified: Secondary | ICD-10-CM | POA: Diagnosis not present

## 2021-08-27 DIAGNOSIS — Z09 Encounter for follow-up examination after completed treatment for conditions other than malignant neoplasm: Secondary | ICD-10-CM | POA: Diagnosis present

## 2021-08-27 DIAGNOSIS — K449 Diaphragmatic hernia without obstruction or gangrene: Secondary | ICD-10-CM | POA: Insufficient documentation

## 2021-08-27 DIAGNOSIS — D123 Benign neoplasm of transverse colon: Secondary | ICD-10-CM | POA: Insufficient documentation

## 2021-08-27 DIAGNOSIS — E785 Hyperlipidemia, unspecified: Secondary | ICD-10-CM | POA: Diagnosis not present

## 2021-08-27 DIAGNOSIS — D128 Benign neoplasm of rectum: Secondary | ICD-10-CM | POA: Diagnosis not present

## 2021-08-27 HISTORY — PX: COLONOSCOPY WITH PROPOFOL: SHX5780

## 2021-08-27 HISTORY — PX: ESOPHAGOGASTRODUODENOSCOPY: SHX5428

## 2021-08-27 HISTORY — DX: Prediabetes: R73.03

## 2021-08-27 LAB — GLUCOSE, CAPILLARY: Glucose-Capillary: 114 mg/dL — ABNORMAL HIGH (ref 70–99)

## 2021-08-27 SURGERY — COLONOSCOPY WITH PROPOFOL
Anesthesia: General

## 2021-08-27 MED ORDER — DEXAMETHASONE SODIUM PHOSPHATE 10 MG/ML IJ SOLN
INTRAMUSCULAR | Status: DC | PRN
Start: 2021-08-27 — End: 2021-08-27
  Administered 2021-08-27: 10 mg via INTRAVENOUS

## 2021-08-27 MED ORDER — FENTANYL CITRATE (PF) 100 MCG/2ML IJ SOLN
INTRAMUSCULAR | Status: DC | PRN
  Administered 2021-08-27: 50 ug via INTRAVENOUS

## 2021-08-27 MED ORDER — PROPOFOL 500 MG/50ML IV EMUL
INTRAVENOUS | Status: DC | PRN
  Administered 2021-08-27: 165 ug/kg/min via INTRAVENOUS

## 2021-08-27 MED ORDER — IPRATROPIUM-ALBUTEROL 0.5-2.5 (3) MG/3ML IN SOLN
RESPIRATORY_TRACT | Status: AC
  Administered 2021-08-27: 3 mL
  Filled 2021-08-27: qty 3

## 2021-08-27 MED ORDER — SODIUM CHLORIDE 0.9 % IV SOLN: INTRAVENOUS | Status: DC

## 2021-08-27 MED ORDER — MIDAZOLAM HCL 2 MG/2ML IJ SOLN
INTRAMUSCULAR | Status: DC | PRN
Start: 2021-08-27 — End: 2021-08-27
  Administered 2021-08-27: 1 mg via INTRAVENOUS

## 2021-08-27 MED ORDER — PHENYLEPHRINE 80 MCG/ML (10ML) SYRINGE FOR IV PUSH (FOR BLOOD PRESSURE SUPPORT)
PREFILLED_SYRINGE | INTRAVENOUS | Status: DC | PRN
  Administered 2021-08-27: 160 ug via INTRAVENOUS

## 2021-08-27 MED ORDER — PROPOFOL 500 MG/50ML IV EMUL
INTRAVENOUS | Status: AC
  Filled 2021-08-27: qty 50

## 2021-08-27 MED ORDER — LIDOCAINE HCL (CARDIAC) PF 100 MG/5ML IV SOSY
PREFILLED_SYRINGE | INTRAVENOUS | Status: DC | PRN
  Administered 2021-08-27: 100 mg via INTRAVENOUS

## 2021-08-27 MED ORDER — MIDAZOLAM HCL 2 MG/2ML IJ SOLN
INTRAMUSCULAR | Status: AC
  Filled 2021-08-27: qty 2

## 2021-08-27 MED ORDER — IPRATROPIUM-ALBUTEROL 0.5-2.5 (3) MG/3ML IN SOLN: 3.0000 mL | Freq: Once | RESPIRATORY_TRACT | Status: AC

## 2021-08-27 MED ORDER — SUCCINYLCHOLINE CHLORIDE 200 MG/10ML IV SOSY
PREFILLED_SYRINGE | INTRAVENOUS | Status: DC | PRN
  Administered 2021-08-27: 160 mg via INTRAVENOUS

## 2021-08-27 MED ORDER — FENTANYL CITRATE (PF) 100 MCG/2ML IJ SOLN
INTRAMUSCULAR | Status: AC
  Filled 2021-08-27: qty 2

## 2021-08-27 MED ORDER — ALBUTEROL SULFATE HFA 108 (90 BASE) MCG/ACT IN AERS
INHALATION_SPRAY | RESPIRATORY_TRACT | Status: DC | PRN
  Administered 2021-08-27: 2 via RESPIRATORY_TRACT

## 2021-08-27 MED ORDER — PROPOFOL 10 MG/ML IV BOLUS
INTRAVENOUS | Status: DC | PRN
  Administered 2021-08-27: 80 mg via INTRAVENOUS

## 2021-08-27 NOTE — H&P (Signed)
? ? ? ?George Bellows, MD ?[address], [place], [place], [place], [address] ?[address], [place], [place], [place], [address] ?Phone: [phone]  ?Fax: [phone] ? ?Primary Care Physician:  George Billings, NP ? ? ?Pre-Procedure History & Physical: ?HPI:  George Glenn is a 61 y.o. male is here for an endoscopy and colonoscopy  ?  ?Past Medical History:  ?Diagnosis Date  ? Anemia   ? Barrett's esophagus   ? Brain damage   ? Chronic kidney disease   ? Dizziness   ? WHEN STANDING  ? GERD (gastroesophageal reflux disease)   ? Barrett's  ? Hyperlipidemia   ? Hypertension   ? Hypogonadism in male   ? Memory loss, short term   ? Osteoporosis   ? Paranoid schizophrenia ([place])   ? Pre-diabetes   ? Tic   ? HANDS  ? ? ?Past Surgical History:  ?Procedure Laterality Date  ? CATARACT EXTRACTION W/PHACO Right 11/12/2014  ? Procedure: CATARACT EXTRACTION PHACO AND INTRAOCULAR LENS PLACEMENT (IOC);  Surgeon: Birder Robson, MD;  Location: [facility];  Service: Ophthalmology;  Laterality: Right;  cassette Lot # [id number]     [place]  00:45 AP 14.7 CDE 6.62  ? CATARACT EXTRACTION W/PHACO Left 12/10/2014  ? Procedure: CATARACT EXTRACTION PHACO AND INTRAOCULAR LENS PLACEMENT (IOC);  Surgeon: Birder Robson, MD;  Location: [facility];  Service: Ophthalmology;  Laterality: Left;  US:00:46.1 ?AP:20.0 ?CDE:9.22 ?LOT PACK #[id number]  ? COLONOSCOPY    ? ESOPHAGOGASTRODUODENOSCOPY (EGD) WITH PROPOFOL N/A 04/23/2016  ? Procedure: ESOPHAGOGASTRODUODENOSCOPY (EGD) WITH PROPOFOL;  Surgeon: George Bellows, MD;  Location: [facility];  Service: Endoscopy;  Laterality: N/A;  ? ? ?Prior to Admission medications   ?Medication Sig Start Date End Date Taking? Authorizing Provider  ?esomeprazole (NEXIUM) 40 MG capsule TAKE 1 CAPSULE(40 MG) BY MOUTH DAILY 02/18/21  Yes George Billings, NP  ?Cyanocobalamin (VITAMIN B 12 PO) Take 1 tablet by mouth daily.     [provider]  ?Docusate Calcium (STOOL SOFTENER PO) Take 1 capsule by mouth daily as  needed.    [provider]  ?LORazepam (ATIVAN) 0.5 MG tablet Take daily at bedtime. 08/17/21   George Billings, NP  ?meclizine (ANTIVERT) 25 MG tablet Take as needed for dizziness. 08/18/21   George Billings, NP  ?simvastatin (ZOCOR) 10 MG tablet TAKE 1 TABLET(10 MG) BY MOUTH DAILY 02/18/21   George Billings, NP  ? ? ?Allergies as of 08/19/2021  ? (No Known Allergies)  ? ? ?Family History  ?Problem Relation Age of Onset  ? Hypertension Father   ? Diabetes Father   ? Heart disease Father   ? Hyperlipidemia Father   ? Cancer Mother   ?     Skin  ? Dementia Mother   ? Diabetes Brother   ? Stroke Maternal Grandmother   ? Stroke Maternal Grandfather   ? Heart disease Paternal Grandmother   ? Heart disease Paternal Grandfather   ? ? ?Social History  ? ?Socioeconomic History  ? Marital status: Single  ?  Spouse name: Not on file  ? Number of children: Not on file  ? Years of education: Not on file  ? Highest education level: Not on file  ?Occupational History  ? Occupation: disability   ?Tobacco Use  ? Smoking status: Never  ? Smokeless tobacco: Never  ?Vaping Use  ? Vaping Use: Never used  ?Substance and Sexual Activity  ? Alcohol use: No  ?  Alcohol/week: 0.0 standard drinks  ? Drug use: No  ?  Sexual activity: Never  ?Other Topics Concern  ? Not on file  ?Social History Narrative  ? Sister is legal guardian   ? ?Social Determinants of Health  ? ?Financial Resource Strain: Not on file  ?Food Insecurity: Not on file  ?Transportation Needs: Not on file  ?Physical Activity: Not on file  ?Stress: Not on file  ?Social Connections: Not on file  ?Intimate Partner Violence: Not on file  ? ? ?Review of Systems: ?See HPI, otherwise negative ROS ? ?Physical Exam: ?BP (!) 122/95   Pulse 79   Temp (!) 97.2 ?F (36.2 ?C) (Temporal)   Resp 17   Ht 6' (1.829 m)   Wt 84.8 kg   SpO2 94%   BMI 25.36 kg/m?  ?General:   Alert,  pleasant and cooperative in NAD ?Head:  Normocephalic and atraumatic. ?Neck:  Supple; no masses or  thyromegaly. ?Lungs:  Clear throughout to auscultation, normal respiratory effort.    ?Heart:  +S1, +S2, Regular rate and rhythm, No edema. ?Abdomen:  Soft, nontender and nondistended. Normal bowel sounds, without guarding, and without rebound.   ?Neurologic:  Alert and  oriented x4;  grossly normal neurologically. ? ?Impression/Plan: ?George Glenn is here for an endoscopy and colonoscopy  to be performed for  evaluation of barrettes esophagus and colon cancer screening  ?   ?Risks, benefits, limitations, and alternatives regarding endoscopy have been reviewed with the patient.  Questions have been answered.  All parties agreeable. ? ? ?George Bellows, MD  08/27/2021, 7:41 AM ? ?

## 2021-08-27 NOTE — Anesthesia Preprocedure Evaluation (Signed)
Anesthesia Evaluation  ?Patient identified by MRN, date of birth, ID band ?Patient awake and Patient confused ? ? ? ?Reviewed: ?Allergy & Precautions, NPO status , Patient's Chart, lab work & pertinent test results ? ?History of Anesthesia Complications ?Negative for: history of anesthetic complications ? ?Airway ?Mallampati: III ? ?TM Distance: <3 FB ?Neck ROM: full ? ? ? Dental ? ?(+) Chipped, Poor Dentition, Missing ?  ?Pulmonary ?neg pulmonary ROS, neg shortness of breath,  ?  ?Pulmonary exam normal ? ? ? ? ? ? ? Cardiovascular ?Exercise Tolerance: Good ?hypertension, (-) angina(-) Past MI and (-) DOE Normal cardiovascular exam ? ? ?  ?Neuro/Psych ? Headaches, PSYCHIATRIC DISORDERS negative psych ROS  ? GI/Hepatic ?GERD  Controlled,  ?Endo/Other  ?negative endocrine ROS ? Renal/GU ?Renal disease  ?negative genitourinary ?  ?Musculoskeletal ? ? Abdominal ?  ?Peds ? Hematology ?negative hematology ROS ?(+)   ?Anesthesia Other Findings ?Past Medical History: ?No date: Anemia ?No date: Barrett's esophagus ?No date: Brain damage ?No date: Chronic kidney disease ?No date: Dizziness ?    Comment:  WHEN STANDING ?No date: GERD (gastroesophageal reflux disease) ?    Comment:  Barrett's ?No date: Hyperlipidemia ?No date: Hypertension ?No date: Hypogonadism in male ?No date: Memory loss, short term ?No date: Osteoporosis ?No date: Paranoid schizophrenia ([place]) ?No date: Pre-diabetes ?No date: Tic ?    Comment:  HANDS ? ?Past Surgical History: ?11/12/2014: CATARACT EXTRACTION W/PHACO; Right ?    Comment:  Procedure: CATARACT EXTRACTION PHACO AND INTRAOCULAR  ?             LENS PLACEMENT ([place]);  Surgeon: Birder Robson, MD;   ?             Location: [facility];  Service: Ophthalmology;  Laterality: ?             Right;  cassette Lot # [id number]     [place]  00:45 AP 14.7 CDE ?             6.62 ?12/10/2014: CATARACT EXTRACTION W/PHACO; Left ?    Comment:  Procedure: CATARACT EXTRACTION PHACO AND  INTRAOCULAR  ?             LENS PLACEMENT ([place]);  Surgeon: Birder Robson, MD;   ?             Location: [facility];  Service: Ophthalmology;  Laterality: ?             Left;  US:00:46.1 ?AP:20.0 ?CDE:9.22 ?LOT PACK #[id number] ?No date: COLONOSCOPY ?04/23/2016: ESOPHAGOGASTRODUODENOSCOPY (EGD) WITH PROPOFOL; N/A ?    Comment:  Procedure: ESOPHAGOGASTRODUODENOSCOPY (EGD) WITH  ?             PROPOFOL;  Surgeon: [name], MD;  Location: [facility]  ?             ENDOSCOPY;  Service: Endoscopy;  Laterality: N/A; ? ?BMI   ? Body Mass Index: 25.36 kg/m?  ?  ? ? Reproductive/Obstetrics ?negative OB ROS ? ?  ? ? ? ? ? ? ? ? ? ? ? ? ? ?  ?  ? ? ? ? ? ? ? ? ?Anesthesia Physical ?Anesthesia Plan ? ?ASA: 3 ? ?Anesthesia Plan: General  ? ?Post-op Pain Management:   ? ?Induction: Intravenous ? ?PONV Risk Score and Plan: Propofol infusion and TIVA ? ?Airway Management Planned: Natural Airway and Nasal Cannula ? ?Additional Equipment:  ? ?Intra-op Plan:  ? ?Post-operative Plan:  ? ?Informed Consent: I have reviewed the  patients History and Physical, chart, labs and discussed the procedure including the risks, benefits and alternatives for the proposed anesthesia with the patient or authorized representative who has indicated his/her understanding and acceptance.  ? ? ? ?Dental Advisory Given ? ?Plan Discussed with: Anesthesiologist, CRNA and Surgeon ? ?Anesthesia Plan Comments: (Sister consented for risks of anesthesia including but not limited to:  ?- adverse reactions to medications ?- risk of airway placement if required ?- damage to eyes, teeth, lips or other oral mucosa ?- nerve damage due to positioning  ?- sore throat or hoarseness ?- Damage to heart, brain, nerves, lungs, other parts of body or loss of life ? ?She voiced understanding.)  ? ? ? ? ? ? ?Anesthesia Quick Evaluation ? ?

## 2021-08-27 NOTE — Anesthesia Postprocedure Evaluation (Signed)
Anesthesia Post Note ? ?Patient: George Glenn ? ?Procedure(s) Performed: COLONOSCOPY WITH PROPOFOL ?ESOPHAGOGASTRODUODENOSCOPY (EGD) ? ?Patient location during evaluation: Endoscopy ?Anesthesia Type: General ?Level of consciousness: awake and alert ?Pain management: pain level controlled ?Vital Signs Assessment: post-procedure vital signs reviewed and stable ?Respiratory status: spontaneous breathing, nonlabored ventilation, respiratory function stable and patient connected to nasal cannula oxygen ?Cardiovascular status: blood pressure returned to baseline and stable ?Postop Assessment: no apparent nausea or vomiting ?Anesthetic complications: no ? ? ?No notable events documented. ? ? ?Last Vitals:  ?Vitals:  ? 08/27/21 0855 08/27/21 0905  ?BP: 100/65 118/84  ?Pulse: 75 77  ?Resp: 20 (!) 22  ?Temp:    ?SpO2: 99% 99%  ?  ?Last Pain:  ?Vitals:  ? 08/27/21 0905  ?TempSrc:   ?PainSc: 0-No pain  ? ? ?  ?  ?  ?  ?  ?  ? ?[name] ? ? ? ? ?

## 2021-08-27 NOTE — Transfer of Care (Signed)
Immediate Anesthesia Transfer of Care Note ? ?Patient: George Glenn ? ?Procedure(s) Performed: COLONOSCOPY WITH PROPOFOL ?ESOPHAGOGASTRODUODENOSCOPY (EGD) ? ?Patient Location: Endoscopy Unit ? ?Anesthesia Type:General ? ?Level of Consciousness: awake ? ?Airway & Oxygen Therapy: Patient Spontanous Breathing and Patient connected to face mask oxygen ? ?Post-op Assessment: Report given to RN and Post -op Vital signs reviewed and stable ? ?Post vital signs: Reviewed and stable ? ?Last Vitals:  ?Vitals Value Taken Time  ?BP 113/74 08/27/21 0845  ?Temp 36.1 ?C 08/27/21 0845  ?Pulse 85 08/27/21 0845  ?Resp 21 08/27/21 0845  ?SpO2 93 % 08/27/21 0845  ? ? ?Last Pain:  ?Vitals:  ? 08/27/21 0845  ?TempSrc: Temporal  ?PainSc: Asleep  ?   ? ?  ? ?Complications: No notable events documented. ?

## 2021-08-27 NOTE — Op Note (Signed)
[facility] ?Gastroenterology ?Patient Name: George Glenn ?Procedure Date: 08/27/2021 6:48 AM ?MRN: [id number] ?Account #: [id number] ?Date of Birth: 09/14/1960 ?Admit Type: Outpatient ?Age: 61 ?Room: [facility] ENDO ROOM 4 ?Gender: Male ?Note Status: Finalized ?Instrument Name: Upper Endoscope [id number] ?Procedure:             Upper GI endoscopy ?Indications:           Surveillance for malignancy due to personal history of  ?                       Barrett's esophagus ?Providers:             [name] MD, MD ?Referring MD:          Jon Billings (Referring MD) ?Medicines:             Monitored Anesthesia Care ?Complications:         No immediate complications. ?Procedure:             Pre-Anesthesia Assessment: ?                       - Prior to the procedure, a History and Physical was  ?                       performed, and patient medications, allergies and  ?                       sensitivities were reviewed. The patient's tolerance  ?                       of previous anesthesia was reviewed. ?                       - The risks and benefits of the procedure and the  ?                       sedation options and risks were discussed with the  ?                       patient. All questions were answered and informed  ?                       consent was obtained. ?                       - ASA Grade Assessment: III - A patient with severe  ?                       systemic disease. ?                       After obtaining informed consent, the endoscope was  ?                       passed under direct vision. Throughout the procedure,  ?                       the patient's blood pressure, pulse, and oxygen  ?                       saturations  were monitored continuously. The Endoscope  ?                       was introduced through the mouth, and advanced to the  ?                       third part of duodenum. The upper GI endoscopy was  ?                       performed with moderate difficulty due to  the  ?                       patient's oxygen desaturation. Successful completion  ?                       of the procedure was aided by administering oxygen.  ?                       The patient tolerated the procedure well. ?Findings: ?     There were esophageal mucosal changes suggestive of long-segment  ?     Barrett's esophagus present in the entire esophagus. The maximum  ?     longitudinal extent of these mucosal changes was 11 cm in length. Mucosa  ?     was biopsied with a cold forceps for histology in 4 quadrants at  ?     intervals of 2 cm from 23 to 34 cm from the incisors. A total of 6  ?     specimen bottles were sent to pathology. ?     A 6 cm hiatal hernia was present. ?     The cardia and gastric fundus were normal on retroflexion. ?     The examined duodenum was normal. ?Impression:            - Esophageal mucosal changes suggestive of  ?                       long-segment Barrett's esophagus. Biopsied. ?                       - 6 cm hiatal hernia. ?                       - Normal examined duodenum. ?Recommendation:        - Await pathology results. ?                       - Perform a colonoscopy today. ?Procedure Code(s):     --- Professional --- ?                       [id number], Esophagogastroduodenoscopy, flexible,  ?                       transoral; with biopsy, single or multiple ?Diagnosis Code(s):     --- Professional --- ?                       K22.70, Barrett's esophagus without dysplasia ?                       K44.9, Diaphragmatic hernia without obstruction  or  ?                       gangrene ?CPT copyright 2019 American Medical Association. All rights reserved. ?The codes documented in this report are preliminary and upon coder review may  ?be revised to meet current compliance requirements. ?[name], MD ?[name] MD, MD ?08/27/2021 8:08:48 AM ?This report has been signed electronically. ?Number of Addenda: 0 ?Note Initiated On: 08/27/2021 6:48 AM ?Estimated Blood Loss:  Estimated  blood loss: none. ?     [facility] ?

## 2021-08-27 NOTE — Anesthesia Procedure Notes (Signed)
Procedure Name: Intubation ?Date/Time: 08/27/2021 7:56 AM ?Performed by: Loletha Grayer, CRNA ?Pre-anesthesia Checklist: Patient identified, Patient being monitored, Timeout performed, Emergency Drugs available and Suction available ?Patient Re-evaluated:Patient Re-evaluated prior to induction ?Oxygen Delivery Method: Circle system utilized ?Preoxygenation: Pre-oxygenation with 100% oxygen ?Induction Type: IV induction ?Ventilation: Mask ventilation without difficulty and Oral airway inserted - appropriate to patient size ?Laryngoscope Size: 3 and McGraph ?Grade View: Grade I ?Tube type: Oral ?Tube size: 7.0 mm ?Number of attempts: 1 ?Airway Equipment and Method: Stylet ?Placement Confirmation: ETT inserted through vocal cords under direct vision, positive ETCO2 and breath sounds checked- equal and bilateral ?Secured at: 22 cm ?Tube secured with: Tape ?Dental Injury: Teeth and Oropharynx as per pre-operative assessment  ? ? ? ? ?

## 2021-08-27 NOTE — Op Note (Signed)
[facility] ?Gastroenterology ?Patient Name: Davey Limas ?Procedure Date: 08/27/2021 7:10 AM ?MRN: [id number] ?Account #: [id number] ?Date of Birth: 1960-11-14 ?Admit Type: Outpatient ?Age: 61 ?Room: [facility] ENDO ROOM 4 ?Gender: Male ?Note Status: Finalized ?Instrument Name: Colonoscope [id number] ?Procedure:             Colonoscopy ?Indications:           Screening for colorectal malignant neoplasm ?Providers:             [name] MD, MD ?Referring MD:          Jon Billings (Referring MD) ?Medicines:             Monitored Anesthesia Care ?Complications:         No immediate complications. ?Procedure:             Pre-Anesthesia Assessment: ?                       - Prior to the procedure, a History and Physical was  ?                       performed, and patient medications, allergies and  ?                       sensitivities were reviewed. The patient's tolerance  ?                       of previous anesthesia was reviewed. ?                       - The risks and benefits of the procedure and the  ?                       sedation options and risks were discussed with the  ?                       patient. All questions were answered and informed  ?                       consent was obtained. ?                       - ASA Grade Assessment: III - A patient with severe  ?                       systemic disease. ?                       After obtaining informed consent, the colonoscope was  ?                       passed under direct vision. Throughout the procedure,  ?                       the patient's blood pressure, pulse, and oxygen  ?                       saturations were monitored continuously. The  ?                       Colonoscope was introduced through  the anus and  ?                       advanced to the the cecum, identified by the  ?                       appendiceal orifice. The colonoscopy was performed  ?                       with ease. The patient tolerated the procedure well.   ?                       The quality of the bowel preparation was excellent. ?Findings: ?     The perianal and digital rectal examinations were normal. ?     Two sessile polyps were found in the ascending colon. The polyps were 5  ?     to 7 mm in size. These polyps were removed with a cold snare. Resection  ?     and retrieval were complete. ?     Two sessile polyps were found in the transverse colon. The polyps were 5  ?     to 7 mm in size. These polyps were removed with a cold snare. Resection  ?     and retrieval were complete. ?     A 7 mm polyp was found in the rectum. The polyp was sessile. The polyp  ?     was removed with a cold snare. Resection and retrieval were complete. ?     The exam was otherwise without abnormality on direct and retroflexion  ?     views. ?Impression:            - Two 5 to 7 mm polyps in the ascending colon, removed  ?                       with a cold snare. Resected and retrieved. ?                       - Two 5 to 7 mm polyps in the transverse colon,  ?                       removed with a cold snare. Resected and retrieved. ?                       - One 7 mm polyp in the rectum, removed with a cold  ?                       snare. Resected and retrieved. ?                       - The examination was otherwise normal on direct and  ?                       retroflexion views. ?Recommendation:        - Discharge patient to home (with escort). ?                       - Resume previous diet. ?                       -  Continue present medications. ?                       - Await pathology results. ?                       - Repeat colonoscopy in 3 years for surveillance based  ?                       on pathology results. ?Procedure Code(s):     --- Professional --- ?                       [phone], Colonoscopy, flexible; with removal of  ?                       tumor(s), polyp(s), or other lesion(s) by snare  ?                       technique ?Diagnosis Code(s):     --- Professional --- ?                        Z12.11, Encounter for screening for malignant neoplasm  ?                       of colon ?                       K63.5, Polyp of colon ?                       K62.1, Rectal polyp ?CPT copyright 2019 American Medical Association. All rights reserved. ?The codes documented in this report are preliminary and upon coder review may  ?be revised to meet current compliance requirements. ?[name], MD ?[name] MD, MD ?08/27/2021 8:30:25 AM ?This report has been signed electronically. ?Number of Addenda: 0 ?Note Initiated On: 08/27/2021 7:10 AM ?Scope Withdrawal Time: 0 hours 10 minutes 20 seconds  ?Total Procedure Duration: 0 hours 14 minutes 37 seconds  ?Estimated Blood Loss:  Estimated blood loss: none. ?     [facility] ?

## 2021-08-28 ENCOUNTER — Encounter: Payer: Self-pay | Admitting: Gastroenterology

## 2021-08-28 LAB — SURGICAL PATHOLOGY

## 2021-09-04 ENCOUNTER — Other Ambulatory Visit: Payer: Self-pay | Admitting: Nurse Practitioner

## 2021-09-04 NOTE — Telephone Encounter (Signed)
Requested Prescriptions  Pending Prescriptions Disp Refills  . esomeprazole (NEXIUM) 40 MG capsule [Pharmacy Med Name: ESOMEPRAZOLE MAGNESIUM '40MG'$  DR CAPS] 90 capsule 1    Sig: TAKE 1 CAPSULE(40 MG) BY MOUTH DAILY     Gastroenterology: Proton Pump Inhibitors 2 Passed - 09/04/2021  9:20 AM      Passed - ALT in normal range and within 360 days    ALT  Date Value Ref Range Status  08/18/2021 35 0 - 44 IU/L Final         Passed - AST in normal range and within 360 days    AST  Date Value Ref Range Status  08/18/2021 28 0 - 40 IU/L Final         Passed - Valid encounter within last 12 months    Recent Outpatient Visits          2 weeks ago Paranoid schizophrenia ([place])   [place], Karen, NP   6 months ago Annual physical exam   [facility] Jon Billings, NP   10 months ago Insomnia, unspecified type   [facility] Jon Billings, NP   1 year ago IFG (impaired fasting glucose)   [facility] McElwee, Lauren A, NP   1 year ago Toenail avulsion, initial encounter   [place], Jake Church, DO      Future Appointments            In 2 weeks Jon Billings, NP [facility], [place]

## 2021-09-20 NOTE — Progress Notes (Signed)
Inform   1. Biopsies of esophagus showed barrettes esophagus- repeat EGD in 3 years- ensure on prilosec 40 mg daily long term . Needs office visit in  a few months to discuss further not urgent   2. Multiple pre cancerous polyps seen- repeat colonoscopy in 3 years

## 2021-09-22 ENCOUNTER — Telehealth: Payer: Self-pay

## 2021-09-22 MED ORDER — OMEPRAZOLE 40 MG PO CPDR: 40.0000 mg | DELAYED_RELEASE_CAPSULE | Freq: Every day | ORAL | 3 refills | Status: DC

## 2021-09-22 NOTE — Progress Notes (Unsigned)
There were no vitals taken for this visit.   Subjective:    Patient ID: George Glenn, male    DOB: January 20, 1961, 61 y.o.   MRN: [id number]  HPI: George Glenn is a 61 y.o. male  No chief complaint on file.  HYPERTENSION Hypertension status: {Blank single:19197::"controlled","uncontrolled","better","worse","exacerbated","stable"}  Satisfied with current treatment? {Blank single:19197::"yes","no"} Duration of hypertension: {Blank single:19197::"chronic","months","years"} BP monitoring frequency:  {Blank single:19197::"not checking","rarely","daily","weekly","monthly","a few times a day","a few times a week","a few times a month"} BP range:  BP medication side effects:  {Blank single:19197::"yes","no"} Medication compliance: {Blank single:19197::"excellent compliance","good compliance","fair compliance","poor compliance"} Previous BP meds:{Blank EXBMWUXL:24401::"UUVO","ZDGUYQIHKV","QQVZDGLOVF/IEPPIRJJOA","CZYSAYTK","ZSWFUXNATF","TDDUKGURKY/HCWC","BJSEGBTDVV (bystolic)","carvedilol","chlorthalidone","clonidine","diltiazem","exforge HCT","HCTZ","irbesartan (avapro)","labetalol","lisinopril","lisinopril-HCTZ","losartan (cozaar)","methyldopa","nifedipine","olmesartan (benicar)","olmesartan-HCTZ","quinapril","ramipril","spironalactone","tekturna","valsartan","valsartan-HCTZ","verapamil"} Aspirin: {Blank single:19197::"yes","no"} Recurrent headaches: {Blank single:19197::"yes","no"} Visual changes: {Blank single:19197::"yes","no"} Palpitations: {Blank single:19197::"yes","no"} Dyspnea: {Blank single:19197::"yes","no"} Chest pain: {Blank single:19197::"yes","no"} Lower extremity edema: {Blank single:19197::"yes","no"} Dizzy/lightheaded: {Blank single:19197::"yes","no"}  Relevant past medical, surgical, family and social history reviewed and updated as indicated. Interim medical history since our last visit reviewed. Allergies and medications reviewed and updated.  Review of  Systems  Per HPI unless specifically indicated above     Objective:    There were no vitals taken for this visit.  Wt Readings from Last 3 Encounters:  08/27/21 187 lb (84.8 kg)  08/19/21 187 lb (84.8 kg)  08/18/21 185 lb 9.6 oz (84.2 kg)    Physical Exam  Results for orders placed or performed during the hospital encounter of 08/27/21  Glucose, capillary  Result Value Ref Range   Glucose-Capillary 114 (H) 70 - 99 mg/dL  Surgical pathology  Result Value Ref Range   SURGICAL PATHOLOGY      SURGICAL PATHOLOGY CASE: [id number] PATIENT: [name] Seen Surgical Pathology Report     Specimen Submitted: A. Esophagus, 34 cm; cbx B. Esophagus, 31 cm; cbx C. Esophagus, 29 cm; cbx D. Esophagus, 27 cm; cbx E. Esophagus, 25 cm; cbx F. Esophagus, 23 cm; cbx G. Colon polyp x2, ascending; cold snare H. Colon polyp x2, transverse; cold snare I. Rectum polyp; cold snare  Clinical History: Barrett's esophagus without dysplasia K22.70.  Colon screening Z12.11.  Large hiatal hernia, colon polyps    DIAGNOSIS: A. ESOPHAGUS, 34 CM; COLD BIOPSY: - COLUMNAR MUCOSA WITH INTESTINAL METAPLASIA, COMPATIBLE WITH HISTORY OF BARRETT'S ESOPHAGUS. - NEGATIVE FOR DYSPLASIA AND MALIGNANCY.  B. ESOPHAGUS, 31 CM; COLD BIOPSY: - COLUMNAR MUCOSA WITH INTESTINAL METAPLASIA, COMPATIBLE WITH HISTORY OF BARRETT'S ESOPHAGUS. - NEGATIVE FOR DYSPLASIA AND MALIGNANCY.  C. ESOPHAGUS, 29 CM; COLD BIOPSY: - SQUAMOCOLUMNAR MUCOSA WITH INTESTINAL METAPLASIA, COMPATIBLE WITH HISTOR Y OF BARRETT'S ESOPHAGUS. - NEGATIVE FOR DYSPLASIA AND MALIGNANCY.  D. ESOPHAGUS, 27 CM; COLD BIOPSY: - COLUMNAR MUCOSA WITH INTESTINAL METAPLASIA, COMPATIBLE WITH HISTORY OF BARRETT'S ESOPHAGUS. - NEGATIVE FOR DYSPLASIA AND MALIGNANCY.  E. ESOPHAGUS, 25 CM; COLD BIOPSY: - SQUAMOCOLUMNAR MUCOSA WITH INTESTINAL METAPLASIA, COMPATIBLE WITH HISTORY OF BARRETT'S ESOPHAGUS. - NEGATIVE FOR DYSPLASIA AND MALIGNANCY.  F.  ESOPHAGUS, 23 CM; COLD BIOPSY: - SQUAMOCOLUMNAR MUCOSA WITH INTESTINAL METAPLASIA, COMPATIBLE WITH HISTORY OF BARRETT'S ESOPHAGUS. - NEGATIVE FOR DYSPLASIA AND MALIGNANCY.  G. COLON POLYPS X2, ASCENDING; COLD SNARE: - MULTIPLE FRAGMENTS OF TUBULAR ADENOMAS. - NEGATIVE FOR HIGH-GRADE DYSPLASIA AND MALIGNANCY.  H. COLON POLYPS X2, TRANSVERSE; COLD SNARE: - MULTIPLE FRAGMENTS OF TUBULAR ADENOMAS. - NEGATIVE FOR HIGH-GRADE DYSPLASIA AND MALIGNANCY.  I. RECTAL POLYP; COLD SNARE: - TUBULAR ADENOMA. - NEGATIVE FOR HIGH-GRADE DYSPLASIA AND MALIGNANCY.  GR OSS DESCRIPTION: A. Labeled: Cbx esophagus at 34 cm rule out Barrett's esophagus Received: Formalin Collection time: 7:59 AM on 08/27/2021 Placed into formalin time: 7:59 AM on  08/27/2021 Tissue fragment(s): 2 Size: Ranges from 0.1-0.3 cm Description: Pink soft tissue fragments Entirely submitted in 1 cassette.  B. Labeled: Cbx esophagus at 31 cm rule out Barrett's Received: Formalin Collection time: 8:00 AM on 08/27/2021 Placed into formalin time: 8:00 AM on 08/27/2021 Tissue fragment(s): Multiple Size: Aggregate, 0.5 x 0.3 x 0.1 cm Description: Pink soft tissue fragments Entirely submitted in 1 cassette.  C. Labeled: Cbx esophagus at 29 cm rule out Barrett's esophagus Received: Formalin Collection time: 8:01 AM on 08/27/2021 Placed into formalin time: 8:01 AM on 08/27/2021 Tissue fragment(s): 2 Size: Ranges from 0.1-0.3 cm Description: Pink soft tissue fragments Entirely submitted in 1 cassette.  D. Labeled: Cbx esophagus at 27 cm rule out Barrett's esophagus R eceived: Formalin Collection time: 8:02 AM on 08/27/2021 Placed into formalin time: 8:02 AM on 08/27/2021 Tissue fragment(s): Multiple Size: Aggregate, 0.5 x 0.2 x 0.1 cm Description: Pink soft tissue fragments Entirely submitted in 1 cassette.  E. Labeled: Cbx esophagus at 27 cm rule out Barrett's esophagus Received: Formalin Collection time: 8:04 AM on  08/27/2021 Placed into formalin time: 8:04 AM on 08/27/2021 Tissue fragment(s): Multiple Size: Aggregate, 1.2 x 0.3 x 0.1 cm Description: Pink soft tissue fragments Entirely submitted in 1 cassette.  F. Labeled: Cbx esophagus at 23 cm rule out Barrett's esophagus Received: Formalin Collection time: 8:04 AM on 08/27/2021 Placed into formalin time: 8:04 AM on 08/27/2021 Tissue fragment(s): 3 Size: Aggregate, 0.3 x 0.3 x 0.1 cm Description: Pink soft tissue fragments Entirely submitted in 1 cassette.  G. Labeled: Cold snare ascending colon polyp x2 Received: Formalin Collection time: 8:18 AM on 08/27/2021 Placed into  formalin time: 8:18 AM on 08/27/2021 Tissue fragment(s): Multiple Size: Aggregate, 1.0 x 0.4 x 0.1 cm Description: Tan soft tissue fragments Entirely submitted in 1 cassette.  H. Labeled: Cold snare transverse colon polyp x2 Received: Formalin Collection time: 8:21 AM on 08/27/2021 Placed into formalin time: 8:21 AM on 08/27/2021 Tissue fragment(s): Multiple Size: Aggregate, 0.6 x 0.4 x 0.1 cm Description: Tan soft tissue fragments Entirely submitted in 1 cassette.  I. Labeled: Cold snare rectal colon polyp Received: Formalin Collection time: 8:27 AM on 08/27/2021 Placed into formalin time: 8:27 AM on 08/27/2021 Tissue fragment(s): 1 Size: 0.5 x 0.3 x 0.1 cm Description: Tan soft tissue fragment Entirely submitted in 1 cassette.  CM 08/27/2021  Final Diagnosis performed by Allena Napoleon, MD.   Electronically signed 08/28/2021 3:57:24PM The electronic signature indicates that the named Attending Pathologist has evaluated the specimen Technical component performed a t [facility], [address], [place], [place] [address] Lab: [phone] Dir: Rush Farmer, MD, MMM  Professional component performed at [facility], [facility], [place], [place], [place] [address] Lab: [phone] Dir: Kathi Simpers, MD       Assessment & Plan:   Problem List  Items Addressed This Visit   None    Follow up plan: No follow-ups on file.

## 2021-09-22 NOTE — Telephone Encounter (Signed)
Sent in prilosec per Dr Vicente Males

## 2021-09-23 ENCOUNTER — Other Ambulatory Visit: Payer: Self-pay | Admitting: Nurse Practitioner

## 2021-09-23 ENCOUNTER — Encounter: Payer: Self-pay | Admitting: Nurse Practitioner

## 2021-09-23 ENCOUNTER — Ambulatory Visit (INDEPENDENT_AMBULATORY_CARE_PROVIDER_SITE_OTHER): Payer: Medicare Other | Admitting: Nurse Practitioner

## 2021-09-23 DIAGNOSIS — I10 Essential (primary) hypertension: Secondary | ICD-10-CM | POA: Diagnosis not present

## 2021-09-23 MED ORDER — LORAZEPAM 0.5 MG PO TABS: ORAL_TABLET | ORAL | 2 refills | Status: DC

## 2021-09-23 MED ORDER — LISINOPRIL 10 MG PO TABS: 10.0000 mg | ORAL_TABLET | Freq: Every day | ORAL | 0 refills | Status: DC

## 2021-09-23 NOTE — Assessment & Plan Note (Addendum)
Chronic. Not well controlled.  Will start Lisinopril '10mg'$  daily. Side effects and benefits of medication discussed during visit.  Follow up in 2 month for reevaluation.  Continue to check blood pressures at home.  Bring log to next visit.

## 2021-09-23 NOTE — Telephone Encounter (Signed)
Requested medication (s) are due for refill today - no  Requested medication (s) are on the active medication list -yes  Future visit scheduled -yes  Last refill: 09/23/21 #30  Notes to clinic: Original Rx today- patient is requesting 90 day supply  Requested Prescriptions  Pending Prescriptions Disp Refills   lisinopril (ZESTRIL) 10 MG tablet [Pharmacy Med Name: LISINOPRIL '10MG'$  TABLETS] 90 tablet     Sig: TAKE 1 TABLET(10 MG) BY MOUTH DAILY     Cardiovascular:  ACE Inhibitors Passed - 09/23/2021 10:44 AM      Passed - Cr in normal range and within 180 days    Creatinine, Ser  Date Value Ref Range Status  08/18/2021 0.91 0.76 - 1.27 mg/dL Final         Passed - K in normal range and within 180 days    Potassium  Date Value Ref Range Status  08/18/2021 4.2 3.5 - 5.2 mmol/L Final         Passed - Patient is not pregnant      Passed - Last BP in normal range    BP Readings from Last 1 Encounters:  09/23/21 125/76         Passed - Valid encounter within last 6 months    Recent Outpatient Visits           Today Primary hypertension   [facility] Jon Billings, NP   1 month ago Paranoid schizophrenia ([place])   [facility] Jon Billings, NP   7 months ago Annual physical exam   [facility] Jon Billings, NP   11 months ago Insomnia, unspecified type   [facility] Jon Billings, NP   1 year ago IFG (impaired fasting glucose)   [facility] McElwee, Scheryl Darter, NP       Future Appointments             In 2 months Jon Billings, NP [place]                Requested Prescriptions  Pending Prescriptions Disp Refills   lisinopril (ZESTRIL) 10 MG tablet [Pharmacy Med Name: LISINOPRIL '10MG'$  TABLETS] 90 tablet     Sig: TAKE 1 TABLET(10 MG) BY MOUTH DAILY     Cardiovascular:  ACE Inhibitors Passed - 09/23/2021 10:44 AM      Passed - Cr in normal range and within 180  days    Creatinine, Ser  Date Value Ref Range Status  08/18/2021 0.91 0.76 - 1.27 mg/dL Final         Passed - K in normal range and within 180 days    Potassium  Date Value Ref Range Status  08/18/2021 4.2 3.5 - 5.2 mmol/L Final         Passed - Patient is not pregnant      Passed - Last BP in normal range    BP Readings from Last 1 Encounters:  09/23/21 125/76         Passed - Valid encounter within last 6 months    Recent Outpatient Visits           Today Primary hypertension   [facility] Jon Billings, NP   1 month ago Paranoid schizophrenia [facility])   [facility] Jon Billings, NP   7 months ago Annual physical exam   [place], NP   11 months ago Insomnia, unspecified type   [facility] Ax[facility], Santiago Glad,  NP   1 year ago IFG (impaired fasting glucose)   [facility] Charyl Dancer, NP       Future Appointments             In 2 months Jon Billings, NP [facility] [place]

## 2021-09-23 NOTE — Telephone Encounter (Signed)
Did you mean to only send 60 tablets?

## 2021-09-24 ENCOUNTER — Ambulatory Visit: Payer: Self-pay | Admitting: *Deleted

## 2021-09-24 ENCOUNTER — Telehealth: Payer: Self-pay | Admitting: Gastroenterology

## 2021-09-24 NOTE — Telephone Encounter (Signed)
Patients sister is calling and has a few questions about patients medication omeprazole. Requesting a call back.

## 2021-09-24 NOTE — Telephone Encounter (Signed)
Summary: Medication Management.   Pt sister stated he went in to see PCP yesterday, and she was advised he was going to get medication lisinopril (ZESTRIL) 10 MG tablet; however, the pharmacy gave her omeprazole (PRILOSEC) 40 MG capsule, and she is confused and isn't sure what to do.   I asked the patient's sister if she asked the pharmacy if they had the medication lisinopril (ZESTRIL) 10 MG tablet. Pt sister said no, she didn't even open medication until this morning.   Pt sister would like to discuss, as per her knowledge, that he has never taken omeprazole (PRILOSEC) 40 MG capsule   Pt sister seeking clinical advice.      Reason for Disposition  [1] Caller has medicine question about med NOT prescribed by PCP AND [2] triager unable to answer question (e.g., compatibility with other med, storage)  Answer Assessment - Initial Assessment Questions 1. NAME of MEDICATION: "What medicine are you calling about?"     Patient's sister is calling- she received Rx for omeprazole and was not aware of that Rx- she is able to get lisinopril from visit yesterday 2. QUESTION: "What is your question?" (e.g., double dose of medicine, side effect)      Sister advised to call GI provider- they had sent in that Rx- not sure why- no chart notes. Patient is able to get lisinopril for visit yesterday- it is waiting for pick up.  Protocols used: Medication Question Call-A-AH

## 2021-09-28 NOTE — Telephone Encounter (Signed)
Called patient's sister-[name] and she wanted to know if her brother was just switched to take Omeprazole instead of Nexium. I told her that after reviewing Dr. Georgeann Oppenheim notes from his colonoscopy and EGD, he stated that he is to take Omeprazole long term. I also told her that he is to repeat his EGD and colonoscopy in 3 years. I also told her that we will place him in our recall list and that we will call him to schedule. [name] understood and had no further questions.

## 2021-11-23 ENCOUNTER — Encounter: Payer: Self-pay | Admitting: Nurse Practitioner

## 2021-11-23 ENCOUNTER — Ambulatory Visit (INDEPENDENT_AMBULATORY_CARE_PROVIDER_SITE_OTHER): Payer: Medicare Other | Admitting: Nurse Practitioner

## 2021-11-23 VITALS — BP 120/79 | HR 71 | Temp 98.6°F | Wt 185.5 lb

## 2021-11-23 DIAGNOSIS — E782 Mixed hyperlipidemia: Secondary | ICD-10-CM

## 2021-11-23 DIAGNOSIS — I10 Essential (primary) hypertension: Secondary | ICD-10-CM

## 2021-11-23 DIAGNOSIS — N181 Chronic kidney disease, stage 1: Secondary | ICD-10-CM | POA: Diagnosis not present

## 2021-11-23 DIAGNOSIS — R7301 Impaired fasting glucose: Secondary | ICD-10-CM

## 2021-11-23 NOTE — Progress Notes (Signed)
Established Patient Office Visit  Subjective:  Patient ID: George Glenn, male    DOB: 04-Sep-1960  Age: 61 y.o. MRN: [id number]  CC:  Chief Complaint  Patient presents with   Hypertension   Hyperlipidemia   Diabetes    HPI George Glenn presents for follow-up with hyperlipidemia and insomnia. His sister says that he is doing well.  They have change their diet to help with the triglycerides.  HYPERLIPIDEMIA Hyperlipidemia status: chronic Satisfied with current treatment?  yes Side effects:  no Medication compliance: excellent compliance Past cholesterol meds: simvastatin (zocor) Supplements: none Aspirin:  no The ASCVD Risk score (Arnett DK, et al., 2019) failed to calculate for the following reasons:   The valid total cholesterol range is 130 to 320 mg/dL Chest pain:  no  ANEMIA Anemia status: controlled Etiology of anemia: Duration of anemia treatment:  Compliance with treatment: excellent compliance Iron supplementation side effects: no Severity of anemia: mild Fatigue: no Decreased exercise tolerance: no  Dyspnea on exertion: no Palpitations: no Bleeding: no Pica: no   INSOMNIA/MOOD  Doing well with lorazepam. No side effects.   Past Medical History:  Diagnosis Date   Anemia    Barrett's esophagus    Brain damage    Chronic kidney disease    Dizziness    WHEN STANDING   GERD (gastroesophageal reflux disease)    Barrett's   Hyperlipidemia    Hypertension    Hypogonadism in male    Memory loss, short term    Osteoporosis    Paranoid schizophrenia ([place])    Pre-diabetes    Tic    HANDS    Past Surgical History:  Procedure Laterality Date   CATARACT EXTRACTION W/PHACO Right 11/12/2014   Procedure: CATARACT EXTRACTION PHACO AND INTRAOCULAR LENS PLACEMENT ([place]);  Surgeon: Birder Robson, MD;  Location: [facility] ORS;  Service: Ophthalmology;  Laterality: Right;  cassette Lot # [id number]     [place]  00:45 AP 14.7 CDE 6.62   CATARACT EXTRACTION W/PHACO  Left 12/10/2014   Procedure: CATARACT EXTRACTION PHACO AND INTRAOCULAR LENS PLACEMENT ([place]);  Surgeon: Birder Robson, MD;  Location: [facility] ORS;  Service: Ophthalmology;  Laterality: Left;  US:00:46.1 AP:20.0 CDE:9.22 LOT PACK #[id number]   COLONOSCOPY     COLONOSCOPY WITH PROPOFOL N/A 08/27/2021   Procedure: COLONOSCOPY WITH PROPOFOL;  Surgeon: [name], MD;  Location: [facility] ENDOSCOPY;  Service: Gastroenterology;  Laterality: N/A;  Please leave patient as 1st case if possible. Sister to bring due to brain injury. (LG)   ESOPHAGOGASTRODUODENOSCOPY N/A 08/27/2021   Procedure: ESOPHAGOGASTRODUODENOSCOPY (EGD);  Surgeon: [name], MD;  Location: [facility] ENDOSCOPY;  Service: Gastroenterology;  Laterality: N/A;   ESOPHAGOGASTRODUODENOSCOPY (EGD) WITH PROPOFOL N/A 04/23/2016   Procedure: ESOPHAGOGASTRODUODENOSCOPY (EGD) WITH PROPOFOL;  Surgeon: [name], MD;  Location: [facility] ENDOSCOPY;  Service: Endoscopy;  Laterality: N/A;    Family History  Problem Relation Age of Onset   Hypertension Father    Diabetes Father    Heart disease Father    Hyperlipidemia Father    Cancer Mother        Skin   Dementia Mother    Diabetes Brother    Stroke Maternal Grandmother    Stroke Maternal Grandfather    Heart disease Paternal Grandmother    Heart disease Paternal Grandfather     Social History   Socioeconomic History   Marital status: Single    Spouse name: Not on file   Number of children: Not on file   Years of education: Not on  file   Highest education level: Not on file  Occupational History   Occupation: disability   Tobacco Use   Smoking status: Never   Smokeless tobacco: Never  Vaping Use   Vaping Use: Never used  Substance and Sexual Activity   Alcohol use: No    Alcohol/week: 0.0 standard drinks of alcohol   Drug use: No   Sexual activity: Never  Other Topics Concern   Not on file  Social History Narrative   Sister is legal guardian    Social Determinants of Health    Financial Resource Strain: Low Risk  (07/14/2017)   Overall Financial Resource Strain (CARDIA)    Difficulty of Paying Living Expenses: Not hard at all  Food Insecurity: No Food Insecurity (07/14/2017)   Hunger Vital Sign    Worried About Running Out of Food in the Last Year: Never true    Ran Out of Food in the Last Year: Never true  Transportation Needs: No Transportation Needs (07/14/2017)   PRAPARE - Hydrologist (Medical): No    Lack of Transportation (Non-Medical): No  Physical Activity: Inactive (07/14/2017)   Exercise Vital Sign    Days of Exercise per Week: 0 days    Minutes of Exercise per Session: 0 min  Stress: No Stress Concern Present (07/14/2017)   [facility]    Feeling of Stress : Not at all  Social Connections: Moderately Isolated (07/14/2017)   Social Connection and Isolation Panel [NHANES]    Frequency of Communication with Friends and Family: Never    Frequency of Social Gatherings with Friends and Family: More than three times a week    Attends Religious Services: Never    Marine scientist or Organizations: No    Attends Archivist Meetings: Never    Marital Status: Never married  Intimate Partner Violence: Not At Risk (07/14/2017)   Humiliation, Afraid, Rape, and Kick questionnaire    Fear of Current or Ex-Partner: No    Emotionally Abused: No    Physically Abused: No    Sexually Abused: No    Outpatient Medications Prior to Visit  Medication Sig Dispense Refill   Cyanocobalamin (VITAMIN B 12 PO) Take 1 tablet by mouth daily.      Docusate Calcium (STOOL SOFTENER PO) Take 1 capsule by mouth daily as needed.     esomeprazole (NEXIUM) 40 MG capsule TAKE 1 CAPSULE(40 MG) BY MOUTH DAILY 90 capsule 1   lisinopril (ZESTRIL) 10 MG tablet TAKE 1 TABLET(10 MG) BY MOUTH DAILY 90 tablet 0   LORazepam (ATIVAN) 0.5 MG tablet Take daily at bedtime. 30 tablet 2    meclizine (ANTIVERT) 25 MG tablet Take as needed for dizziness. 90 tablet 1   omeprazole (PRILOSEC) 40 MG capsule Take 1 capsule (40 mg total) by mouth daily. 90 capsule 3   simvastatin (ZOCOR) 10 MG tablet TAKE 1 TABLET(10 MG) BY MOUTH DAILY 90 tablet 1   No facility-administered medications prior to visit.    No Known Allergies  ROS Review of Systems  Constitutional: Negative.   HENT: Negative.    Eyes: Negative.   Respiratory: Negative.    Cardiovascular: Negative.   Endocrine: Negative.   Genitourinary: Negative.   Musculoskeletal: Negative.   Neurological: Negative.   Psychiatric/Behavioral: Negative.        Objective:    Physical Exam Vitals and nursing note reviewed.  Constitutional:      Appearance: Normal  appearance.  HENT:     Head: Normocephalic.  Eyes:     Conjunctiva/sclera: Conjunctivae normal.  Cardiovascular:     Rate and Rhythm: Normal rate and regular rhythm.     Pulses: Normal pulses.     Heart sounds: Normal heart sounds.  Pulmonary:     Effort: Pulmonary effort is normal.     Breath sounds: Normal breath sounds.  Musculoskeletal:     Cervical back: Normal range of motion.  Skin:    General: Skin is warm and dry.  Neurological:     Mental Status: He is alert. He is disoriented.  Psychiatric:     Comments: Slow to respond to questions. Flat affect     BP 120/79   Pulse 71   Temp 98.6 F (37 C) (Oral)   Wt 185 lb 8 oz (84.1 kg)   SpO2 96%   BMI 25.16 kg/m  Wt Readings from Last 3 Encounters:  11/23/21 185 lb 8 oz (84.1 kg)  09/23/21 184 lb 3.2 oz (83.6 kg)  08/27/21 187 lb (84.8 kg)    Lab Results  Component Value Date   WBC 4.6 08/18/2021   HGB 16.4 08/18/2021   HCT 48.6 08/18/2021   MCV 86 08/18/2021   PLT 203 08/18/2021   Lab Results  Component Value Date   NA 141 08/18/2021   K 4.2 08/18/2021   CO2 26 08/18/2021   GLUCOSE 95 08/18/2021   BUN 19 08/18/2021   CREATININE 0.91 08/18/2021   BILITOT 0.5 08/18/2021    ALKPHOS 96 08/18/2021   AST 28 08/18/2021   ALT 35 08/18/2021   PROT 6.9 08/18/2021   ALBUMIN 4.4 08/18/2021   CALCIUM 8.8 08/18/2021   EGFR 96 08/18/2021   Lab Results  Component Value Date   CHOL 108 08/18/2021   Lab Results  Component Value Date   HDL 27 (L) 08/18/2021   Lab Results  Component Value Date   LDLCALC 67 08/18/2021   Lab Results  Component Value Date   TRIG 61 08/18/2021    Lab Results  Component Value Date   HGBA1C 6.1 (H) 08/18/2021      Assessment & Plan:   Problem List Items Addressed This Visit       Cardiovascular and Mediastinum   Hypertension - Primary    Chronic. Controlled.  Continue Lisinopril 52m daily. Side effects and benefits of medication discussed during visit.  Follow up in 3 month for reevaluation.  Continue to check blood pressures at home.  Bring log to next visit.      Relevant Orders   Comp Met (CMET)     Endocrine   IFG (impaired fasting glucose)    Labs ordered today. Will make recommendations based on lab results.      Relevant Orders   HgB A1c     Genitourinary   CKD (chronic kidney disease)    Labs ordered at visit today.  Will make recommendations based on lab results.         Other   Hyperlipidemia    Chronic.  Controlled.  Continue with current medication regimen of Simvastatin 187m  Labs ordered today.  Return to clinic in 6 months for reevaluation.  Call sooner if concerns arise.        Relevant Orders   Lipid Profile    No orders of the defined types were placed in this encounter.   Follow-up: No follow-ups on file.    KaJon BillingsNP

## 2021-11-23 NOTE — Assessment & Plan Note (Signed)
Labs ordered at visit today.  Will make recommendations based on lab results.   

## 2021-11-23 NOTE — Assessment & Plan Note (Signed)
Chronic.  Controlled.  Continue with current medication regimen of Simvastatin '10mg'$ .  Labs ordered today.  Return to clinic in 6 months for reevaluation.  Call sooner if concerns arise.

## 2021-11-23 NOTE — Assessment & Plan Note (Signed)
Chronic. Controlled.  Continue Lisinopril '10mg'$  daily. Side effects and benefits of medication discussed during visit.  Follow up in 3 month for reevaluation.  Continue to check blood pressures at home.  Bring log to next visit.

## 2021-11-23 NOTE — Assessment & Plan Note (Signed)
Labs ordered today.  Will make recommendations based on lab results. ?

## 2021-11-24 LAB — COMPREHENSIVE METABOLIC PANEL
ALT: 34 IU/L (ref 0–44)
AST: 20 IU/L (ref 0–40)
Albumin/Globulin Ratio: 1.9 (ref 1.2–2.2)
Albumin: 4.6 g/dL (ref 3.9–4.9)
Alkaline Phosphatase: 102 IU/L (ref 44–121)
BUN/Creatinine Ratio: 17 (ref 10–24)
BUN: 16 mg/dL (ref 8–27)
Bilirubin Total: 0.4 mg/dL (ref 0.0–1.2)
CO2: 24 mmol/L (ref 20–29)
Calcium: 9.1 mg/dL (ref 8.6–10.2)
Chloride: 104 mmol/L (ref 96–106)
Creatinine, Ser: 0.95 mg/dL (ref 0.76–1.27)
Globulin, Total: 2.4 g/dL (ref 1.5–4.5)
Glucose: 112 mg/dL — ABNORMAL HIGH (ref 70–99)
Potassium: 4.5 mmol/L (ref 3.5–5.2)
Sodium: 142 mmol/L (ref 134–144)
Total Protein: 7 g/dL (ref 6.0–8.5)
eGFR: 91 mL/min/{1.73_m2} (ref >59)

## 2021-11-24 LAB — LIPID PANEL
Chol/HDL Ratio: 5.1 ratio — ABNORMAL HIGH (ref 0.0–5.0)
Cholesterol, Total: 137 mg/dL (ref 100–199)
HDL: 27 mg/dL — ABNORMAL LOW (ref >39)
LDL Chol Calc (NIH): 85 mg/dL (ref 0–99)
Triglycerides: 138 mg/dL (ref 0–149)
VLDL Cholesterol Cal: 25 mg/dL (ref 5–40)

## 2021-11-24 LAB — HEMOGLOBIN A1C
Est. average glucose Bld gHb Est-mCnc: 123 mg/dL
Hgb A1c MFr Bld: 5.9 % — ABNORMAL HIGH (ref 4.8–5.6)

## 2021-11-24 NOTE — Progress Notes (Signed)
Please let patient know that his lab work looks good.  Cholesterol looks good.  A1c is well controlled at 5.9.  Continue with current medication regimen.  Follow up as discussed.

## 2021-12-04 ENCOUNTER — Other Ambulatory Visit: Payer: Self-pay | Admitting: Nurse Practitioner

## 2021-12-04 NOTE — Telephone Encounter (Signed)
Requested Prescriptions  Pending Prescriptions Disp Refills  . simvastatin (ZOCOR) 10 MG tablet [Pharmacy Med Name: SIMVASTATIN '10MG'$  TABLETS] 90 tablet 1    Sig: TAKE 1 TABLET(10 MG) BY MOUTH DAILY     Cardiovascular:  Antilipid - Statins Failed - 12/04/2021  7:02 AM      Failed - Lipid Panel in normal range within the last 12 months    Cholesterol, Total  Date Value Ref Range Status  11/23/2021 137 100 - 199 mg/dL Final   LDL Chol Calc (NIH)  Date Value Ref Range Status  11/23/2021 85 0 - 99 mg/dL Final   HDL  Date Value Ref Range Status  11/23/2021 27 (L) >39 mg/dL Final   Triglycerides  Date Value Ref Range Status  11/23/2021 138 0 - 149 mg/dL Final         Passed - Patient is not pregnant      Passed - Valid encounter within last 12 months    Recent Outpatient Visits          1 week ago Primary hypertension   [place], Karen, NP   2 months ago Primary hypertension   [facility] Jon Billings, NP   3 months ago Paranoid schizophrenia ([place])   [facility] Jon Billings, NP   9 months ago Annual physical exam   [facility] Jon Billings, NP   1 year ago Insomnia, unspecified type   [facility] Jon Billings, NP             . lisinopril (ZESTRIL) 10 MG tablet [Pharmacy Med Name: LISINOPRIL '10MG'$  TABLETS] 90 tablet 0    Sig: TAKE 1 TABLET(10 MG) BY MOUTH DAILY     Cardiovascular:  ACE Inhibitors Passed - 12/04/2021  7:02 AM      Passed - Cr in normal range and within 180 days    Creatinine, Ser  Date Value Ref Range Status  11/23/2021 0.95 0.76 - 1.27 mg/dL Final         Passed - K in normal range and within 180 days    Potassium  Date Value Ref Range Status  11/23/2021 4.5 3.5 - 5.2 mmol/L Final         Passed - Patient is not pregnant      Passed - Last BP in normal range    BP Readings from Last 1 Encounters:  11/23/21 120/79         Passed - Valid  encounter within last 6 months    Recent Outpatient Visits          1 week ago Primary hypertension   [facility] Jon Billings, NP   2 months ago Primary hypertension   [facility] Jon Billings, NP   3 months ago Paranoid schizophrenia [facility])   [facility] Jon Billings, NP   9 months ago Annual physical exam   [facility] Jon Billings, NP   1 year ago Insomnia, unspecified type   [facility] Jon Billings, NP

## 2022-02-05 ENCOUNTER — Telehealth: Payer: Self-pay | Admitting: Nurse Practitioner

## 2022-02-05 NOTE — Telephone Encounter (Signed)
Copied from [place] [id number]. Topic: General - Inquiry >> Feb 05, 2022 12:49 PM Rosanne Ashing P wrote: Reason for CRM: Pt needs a new referral for home health care through [facility],  His expired yesterday so it needs to be done asap.  They need to do a new assessment. Can you please fax an order to get his assessment  Fax #  [phone]

## 2022-02-05 NOTE — Telephone Encounter (Signed)
He will have to have an appt if they are requiring new assessment.   Please call the company and get the paperwork they need prior to appt.

## 2022-02-05 NOTE — Telephone Encounter (Signed)
Pt scheduled 10/25

## 2022-02-09 NOTE — Telephone Encounter (Signed)
Caller states [employer] can be contacted for additional questions regarding forms  Caller did not have contact information for [employer]  Caller states pts home health start date was 12-17

## 2022-02-09 NOTE — Telephone Encounter (Signed)
Unable to find # for [facility], contacted [name] (legal guardian) she states she has never had to bring any forms and she does not know how to go about it either. I have no received anything from [facility] to recertify patient.

## 2022-02-09 NOTE — Telephone Encounter (Signed)
Spoke with [name] (legal guardian) she was able to give me the number for [facility] [phone] or [phone], I attempted to call both numbers, unsuccessful. Will try again tomorrow. [name] also gave me the number for the home health nurse, [employer] [facility] but I was unable to call due to their office closing at 4 PM.

## 2022-02-10 ENCOUNTER — Encounter: Payer: Self-pay | Admitting: Nurse Practitioner

## 2022-02-10 ENCOUNTER — Ambulatory Visit (INDEPENDENT_AMBULATORY_CARE_PROVIDER_SITE_OTHER): Payer: Medicare Other | Admitting: Nurse Practitioner

## 2022-02-10 VITALS — BP 108/77 | HR 85 | Temp 98.2°F | Wt 190.1 lb

## 2022-02-10 DIAGNOSIS — Z23 Encounter for immunization: Secondary | ICD-10-CM

## 2022-02-10 DIAGNOSIS — R21 Rash and other nonspecific skin eruption: Secondary | ICD-10-CM | POA: Diagnosis not present

## 2022-02-10 DIAGNOSIS — Z741 Need for assistance with personal care: Secondary | ICD-10-CM | POA: Diagnosis not present

## 2022-02-10 DIAGNOSIS — Z7189 Other specified counseling: Secondary | ICD-10-CM

## 2022-02-10 DIAGNOSIS — Z0000 Encounter for general adult medical examination without abnormal findings: Secondary | ICD-10-CM

## 2022-02-10 MED ORDER — LORAZEPAM 0.5 MG PO TABS: ORAL_TABLET | ORAL | 2 refills | Status: DC

## 2022-02-10 MED ORDER — TRIAMCINOLONE ACETONIDE 0.1 % EX CREA: 1.0000 | TOPICAL_CREAM | Freq: Two times a day (BID) | CUTANEOUS | 0 refills | Status: DC

## 2022-02-10 NOTE — Assessment & Plan Note (Signed)
A voluntary discussion about advance care planning including the explanation and discussion of advance directives was extensively discussed  with the patient for 10 minutes with patient and Legal Guardian Clovia Cuff present.  Explanation about the health care proxy and Living will was reviewed and packet with forms with explanation of how to fill them out was given.  During this discussion, the patient was able to identify a health care proxy as Clovia Cuff and plans to fill out the paperwork required.  Patient was offered a separate [place] visit for further assistance with forms.

## 2022-02-10 NOTE — Progress Notes (Signed)
BP 108/77   Pulse 85   Temp 98.2 F (36.8 C) (Oral)   Wt 190 lb 1.6 oz (86.2 kg)   SpO2 98%   BMI 25.78 kg/m    Subjective:    Patient ID: George Glenn, male    DOB: Aug 14, 1960, 61 y.o.   MRN: [id number]  HPI: George Glenn is a 61 y.o. male presenting on 02/10/2022 for comprehensive medical examination. Current medical complaints include:none  He currently lives with: Interim Problems from his last visit: no   Denies HA, CP, SOB, dizziness, palpitations, visual changes, and lower extremity swelling.   Patient's caregiver states he has a nurse that comes out to the home from [place] by an [place]. The company is now called by [employer]. He has had this company since December 2017. He gets reevaluated annually. Patient doesn't shower by himself.  She helps him clean his room, make his bed.  While patient's caregiver is out of town the aid helps make him meals.    Functional Status Survey:    FALL RISK:    02/10/2022    2:39 PM 11/23/2021   10:03 AM 09/23/2021   10:08 AM 08/18/2021   10:24 AM 02/18/2021   11:11 AM  Fall Risk   Falls in the past year? 0 0 0 0 0  Number falls in past yr: 0 0 0 0 0  Injury with Fall? 0 0 0 0 0  Risk for fall due to : No Fall Risks No Fall Risks No Fall Risks No Fall Risks   Follow up Falls evaluation completed Falls evaluation completed Falls evaluation completed Falls evaluation completed     Depression Screen    02/10/2022    2:39 PM 11/23/2021   10:03 AM 09/23/2021   10:08 AM 08/18/2021   10:24 AM 02/18/2021   11:01 AM  Depression screen PHQ 2/9  Decreased Interest 0 0 0 0 0  Down, Depressed, Hopeless 0 0 0 0 0  PHQ - 2 Score 0 0 0 0 0  Altered sleeping 0 0 0 0 0  Tired, decreased energy 0 0 0 0 0  Change in appetite 0 0 0 0 0  Feeling bad or failure about yourself  0 0 0 0 0  Trouble concentrating 0 0 0 0 0  Moving slowly or fidgety/restless 0 0 0 0 0  Suicidal thoughts 0 0 0 0 0  PHQ-9 Score 0 0 0 0 0  Difficult doing work/chores  Not difficult at all Not difficult at all Not difficult at all Not difficult at all     Advanced Directives Does not have an advanced directive or living will.    Past Medical History:  Past Medical History:  Diagnosis Date   Anemia    Barrett's esophagus    Brain damage    Chronic kidney disease    Dizziness    WHEN STANDING   GERD (gastroesophageal reflux disease)    Barrett's   Hyperlipidemia    Hypertension    Hypogonadism in male    Memory loss, short term    Osteoporosis    Paranoid schizophrenia ([place])    Pre-diabetes    Tic    HANDS    Surgical History:  Past Surgical History:  Procedure Laterality Date   CATARACT EXTRACTION W/PHACO Right 11/12/2014   Procedure: CATARACT EXTRACTION PHACO AND INTRAOCULAR LENS PLACEMENT ([place]);  Surgeon: Birder Robson, MD;  Location: [facility] ORS;  Service: Ophthalmology;  Laterality: Right;  cassette Lot #  [id number]     [place]  00:45 AP 14.7 CDE 6.62   CATARACT EXTRACTION W/PHACO Left 12/10/2014   Procedure: CATARACT EXTRACTION PHACO AND INTRAOCULAR LENS PLACEMENT ([facility]);  Surgeon: Birder Robson, MD;  Location: [facility] ORS;  Service: Ophthalmology;  Laterality: Left;  US:00:46.1 AP:20.0 CDE:9.22 LOT PACK #[id number]   COLONOSCOPY     COLONOSCOPY WITH PROPOFOL N/A 08/27/2021   Procedure: COLONOSCOPY WITH PROPOFOL;  Surgeon: [name], MD;  Location: [facility] ENDOSCOPY;  Service: Gastroenterology;  Laterality: N/A;  Please leave patient as 1st case if possible. Sister to bring due to brain injury. (LG)   ESOPHAGOGASTRODUODENOSCOPY N/A 08/27/2021   Procedure: ESOPHAGOGASTRODUODENOSCOPY (EGD);  Surgeon: [name], MD;  Location: [facility] ENDOSCOPY;  Service: Gastroenterology;  Laterality: N/A;   ESOPHAGOGASTRODUODENOSCOPY (EGD) WITH PROPOFOL N/A 04/23/2016   Procedure: ESOPHAGOGASTRODUODENOSCOPY (EGD) WITH PROPOFOL;  Surgeon: [name], MD;  Location: [facility] ENDOSCOPY;  Service: Endoscopy;  Laterality: N/A;    Medications:  Current Outpatient Medications  on File Prior to Visit  Medication Sig   Cyanocobalamin (VITAMIN B 12 PO) Take 1 tablet by mouth daily.    Docusate Calcium (STOOL SOFTENER PO) Take 1 capsule by mouth daily as needed.   esomeprazole (NEXIUM) 40 MG capsule TAKE 1 CAPSULE(40 MG) BY MOUTH DAILY   lisinopril (ZESTRIL) 10 MG tablet TAKE 1 TABLET(10 MG) BY MOUTH DAILY   meclizine (ANTIVERT) 25 MG tablet Take as needed for dizziness.   omeprazole (PRILOSEC) 40 MG capsule Take 1 capsule (40 mg total) by mouth daily.   simvastatin (ZOCOR) 10 MG tablet TAKE 1 TABLET(10 MG) BY MOUTH DAILY   No current facility-administered medications on file prior to visit.    Allergies:  No Known Allergies  Social History:  Social History   Socioeconomic History   Marital status: Single    Spouse name: Not on file   Number of children: Not on file   Years of education: Not on file   Highest education level: Not on file  Occupational History   Occupation: disability   Tobacco Use   Smoking status: Never   Smokeless tobacco: Never  Vaping Use   Vaping Use: Never used  Substance and Sexual Activity   Alcohol use: No    Alcohol/week: 0.0 standard drinks of alcohol   Drug use: No   Sexual activity: Never  Other Topics Concern   Not on file  Social History Narrative   Sister is legal guardian    Social Determinants of Health   Financial Resource Strain: Low Risk  (07/14/2017)   Overall Financial Resource Strain (CARDIA)    Difficulty of Paying Living Expenses: Not hard at all  Food Insecurity: No Food Insecurity (07/14/2017)   Hunger Vital Sign    Worried About Running Out of Food in the Last Year: Never true    [place] in the Last Year: Never true  Transportation Needs: No Transportation Needs (07/14/2017)   PRAPARE - Hydrologist (Medical): No    Lack of Transportation (Non-Medical): No  Physical Activity: Inactive (07/14/2017)   Exercise Vital Sign    Days of Exercise per Week: 0 days     Minutes of Exercise per Session: 0 min  Stress: No Stress Concern Present (07/14/2017)   [place]    Feeling of Stress : Not at all  Social Connections: Moderately Isolated (07/14/2017)   Social Connection and Isolation Panel [NHANES]    Frequency of Communication with  Friends and Family: Never    Frequency of Social Gatherings with Friends and Family: More than three times a week    Attends Religious Services: Never    Marine scientist or Organizations: No    Attends Archivist Meetings: Never    Marital Status: Never married  Intimate Partner Violence: Not At Risk (07/14/2017)   Humiliation, Afraid, Rape, and Kick questionnaire    Fear of Current or Ex-Partner: No    Emotionally Abused: No    Physically Abused: No    Sexually Abused: No   Social History   Tobacco Use  Smoking Status Never  Smokeless Tobacco Never   Social History   Substance and Sexual Activity  Alcohol Use No   Alcohol/week: 0.0 standard drinks of alcohol    Family History:  Family History  Problem Relation Age of Onset   Hypertension Father    Diabetes Father    Heart disease Father    Hyperlipidemia Father    Cancer Mother        Skin   Dementia Mother    Diabetes Brother    Stroke Maternal Grandmother    Stroke Maternal Grandfather    Heart disease Paternal Grandmother    Heart disease Paternal Grandfather     Past medical history, surgical history, medications, allergies, family history and social history reviewed with patient today and changes made to appropriate areas of the chart.   Review of Systems  Eyes:  Negative for blurred vision and double vision.  Respiratory:  Negative for shortness of breath.   Cardiovascular:  Negative for chest pain, palpitations and leg swelling.  Skin:  Positive for itching and rash.  Neurological:  Negative for dizziness and headaches.   All other ROS negative except what  is listed above and in the HPI.      Objective:    BP 108/77   Pulse 85   Temp 98.2 F (36.8 C) (Oral)   Wt 190 lb 1.6 oz (86.2 kg)   SpO2 98%   BMI 25.78 kg/m   Wt Readings from Last 3 Encounters:  02/10/22 190 lb 1.6 oz (86.2 kg)  11/23/21 185 lb 8 oz (84.1 kg)  09/23/21 184 lb 3.2 oz (83.6 kg)    No results found.  Physical Exam Vitals and nursing note reviewed.  Constitutional:      General: He is not in acute distress.    Appearance: Normal appearance. He is normal weight. He is not ill-appearing, toxic-appearing or diaphoretic.  HENT:     Head: Normocephalic.     Right Ear: Tympanic membrane, ear canal and external ear normal.     Left Ear: Tympanic membrane, ear canal and external ear normal.     Nose: Nose normal. No congestion or rhinorrhea.     Mouth/Throat:     Mouth: Mucous membranes are moist.  Eyes:     General:        Right eye: No discharge.        Left eye: No discharge.     Extraocular Movements: Extraocular movements intact.     Conjunctiva/sclera: Conjunctivae normal.     Pupils: Pupils are equal, round, and reactive to light.  Cardiovascular:     Rate and Rhythm: Normal rate and regular rhythm.     Heart sounds: No murmur heard. Pulmonary:     Effort: Pulmonary effort is normal. No respiratory distress.     Breath sounds: Normal breath sounds. No wheezing, rhonchi or rales.  Abdominal:     General: Abdomen is flat. Bowel sounds are normal. There is no distension.     Palpations: Abdomen is soft.     Tenderness: There is no abdominal tenderness. There is no guarding.  Musculoskeletal:     Cervical back: Normal range of motion and neck supple.  Skin:    General: Skin is warm and dry.     Capillary Refill: Capillary refill takes less than 2 seconds.     Findings: Rash present.       Neurological:     General: No focal deficit present.     Mental Status: He is alert and oriented to person, place, and time.     Cranial Nerves: No cranial  nerve deficit.     Motor: No weakness.     Deep Tendon Reflexes: Reflexes normal.  Psychiatric:        Mood and Affect: Mood normal.        Behavior: Behavior normal.        Thought Content: Thought content normal.        Judgment: Judgment normal.         No data to display          Cognitive Testing - 6-CIT  Correct? Score   What year is it? no 4 Yes = 0    No = 4  What month is it? no 4 Yes = 0    No = 3  Remember:     Pia Mau, [address], [place]     What time is it? no 4 Yes = 0    No = 3  Count backwards from 20 to 1 no 4 Correct = 0    1 error = 2   More than 1 error = 4  Say the months of the year in reverse. no 4 Correct = 0    1 error = 2   More than 1 error = 4  What address did I ask you to remember? no 4 Correct = 0  1 error = 2    2 error = 4    3 error = 6    4 error = 8    All wrong = 10       TOTAL SCORE  28/28   Interpretation:  Normal  Normal (0-7) Abnormal (8-28)    Results for orders placed or performed in visit on 11/23/21  Comp Met (CMET)  Result Value Ref Range   Glucose 112 (H) 70 - 99 mg/dL   BUN 16 8 - 27 mg/dL   Creatinine, Ser 0.95 0.76 - 1.27 mg/dL   eGFR 91 >59 mL/min/1.73   BUN/Creatinine Ratio 17 10 - 24   Sodium 142 134 - 144 mmol/L   Potassium 4.5 3.5 - 5.2 mmol/L   Chloride 104 96 - 106 mmol/L   CO2 24 20 - 29 mmol/L   Calcium 9.1 8.6 - 10.2 mg/dL   Total Protein 7.0 6.0 - 8.5 g/dL   Albumin 4.6 3.9 - 4.9 g/dL   Globulin, Total 2.4 1.5 - 4.5 g/dL   Albumin/Globulin Ratio 1.9 1.2 - 2.2   Bilirubin Total 0.4 0.0 - 1.2 mg/dL   Alkaline Phosphatase 102 44 - 121 IU/L   AST 20 0 - 40 IU/L   ALT 34 0 - 44 IU/L  Lipid Profile  Result Value Ref Range   Cholesterol, Total 137 100 - 199 mg/dL   Triglycerides 138 0 - 149  mg/dL   HDL 27 (L) >39 mg/dL   VLDL Cholesterol Cal 25 5 - 40 mg/dL   LDL Chol Calc (NIH) 85 0 - 99 mg/dL   Chol/HDL Ratio 5.1 (H) 0.0 - 5.0 ratio  HgB A1c  Result Value Ref Range   Hgb A1c MFr Bld 5.9  (H) 4.8 - 5.6 %   Est. average glucose Bld gHb Est-mCnc 123 mg/dL      Assessment & Plan:   Problem List Items Addressed This Visit       Other   Advanced care planning/counseling discussion    A voluntary discussion about advance care planning including the explanation and discussion of advance directives was extensively discussed  with the patient for 10 minutes with patient and Legal Guardian Clovia Cuff present.  Explanation about the health care proxy and Living will was reviewed and packet with forms with explanation of how to fill them out was given.  During this discussion, the patient was able to identify a health care proxy as Clovia Cuff and plans to fill out the paperwork required.  Patient was offered a separate [place] visit for further assistance with forms.         Other Visit Diagnoses     Encounter for annual wellness exam in Medicare patient    -  Primary   Requires assistance with activities of daily living (ADL)       Has been receiving care through [facility] by an Ramos since 2017. Requires assistance with bathing, food prep, and basic household maintenance.   Rash       Will treat with triamcinalone cream. Follow up if not improved.   Need for influenza vaccination       Relevant Orders   Flu Vaccine QUAD 6+ mos PF IM (Fluarix Quad PF) (Completed)        Preventative Services:  Health Risk Assessment and Personalized Prevention Plan: Up to date Bone Mass Measurements: NA CVD Screening: Up to date Colon Cancer Screening: Up to date Depression Screening: Done today Diabetes Screening: Up to date Glaucoma Screening: Needs eye exam Hepatitis B vaccine: NA Hepatitis C screening: Up to date HIV Screening: Up to date Flu Vaccine: Given today Lung cancer Screening: NA Obesity Screening: Up to date Pneumonia Vaccines (2): Up to date STI Screening: NA PSA screening: Up to date  Discussed aspirin prophylaxis for myocardial infarction  prevention and decision was it was not indicated  LABORATORY TESTING:  Health maintenance labs ordered today as discussed above.   The natural history of prostate cancer and ongoing controversy regarding screening and potential treatment outcomes of prostate cancer has been discussed with the patient. The meaning of a false positive PSA and a false negative PSA has been discussed. He indicates understanding of the limitations of this screening test and wishes to proceed with screening PSA testing.   IMMUNIZATIONS:   - Tdap: Tetanus vaccination status reviewed: On medicare. - Influenza: Administered today - Pneumovax: Up to date - Prevnar: Not applicable - Zostavax vaccine:  Discussed at visit today  SCREENING: - Colonoscopy: Up to date  Discussed with patient purpose of the colonoscopy is to detect colon cancer at curable precancerous or early stages   - AAA Screening: Not applicable  -Hearing Test: Not applicable  -Spirometry: Not applicable   PATIENT COUNSELING:    Sexuality: Discussed sexually transmitted diseases, partner selection, use of condoms, avoidance of unintended pregnancy  and contraceptive alternatives.   Advised to avoid cigarette smoking.  I discussed with the patient that most people either abstain from alcohol or drink within safe limits (<=14/week and <=4 drinks/occasion for males, <=7/weeks and <= 3 drinks/occasion for females) and that the risk for alcohol disorders and other health effects rises proportionally with the number of drinks per week and how often a drinker exceeds daily limits.  Discussed cessation/primary prevention of drug use and availability of treatment for abuse.   Diet: Encouraged to adjust caloric intake to maintain  or achieve ideal body weight, to reduce intake of dietary saturated fat and total fat, to limit sodium intake by avoiding high sodium foods and not adding table salt, and to maintain adequate dietary potassium and calcium  preferably from fresh fruits, vegetables, and low-fat dairy products.    stressed the importance of regular exercise  Injury prevention: Discussed safety belts, safety helmets, smoke detector, smoking near bedding or upholstery.   Dental health: Discussed importance of regular tooth brushing, flossing, and dental visits.   Follow up plan: NEXT PREVENTATIVE PHYSICAL DUE IN 1 YEAR. Return in about 2 years (around 02/11/2024) for HTN, HLD, DM2 FU.

## 2022-04-08 NOTE — Progress Notes (Signed)
Established Patient Office Visit  Subjective:  Patient ID: George Glenn, male    DOB: August 06, 1960  Age: 61 y.o. MRN: [id number]  CC:  Chief Complaint  Patient presents with   Diabetes   Hyperlipidemia   Hypertension    HPI George Glenn presents for follow-up with hyperlipidemia and insomnia. His sister says that he is doing well.  They have change their diet to help with the triglycerides.  Patient is doing well.   HYPERLIPIDEMIA Hyperlipidemia status: chronic Satisfied with current treatment?  yes Side effects:  no Medication compliance: excellent compliance Past cholesterol meds: simvastatin (zocor) Supplements: none Aspirin:  no The 10-year ASCVD risk score (Arnett DK, et al., 2019) is: 19.8%   Values used to calculate the score:     Age: 73 years     Sex: Male     Is Non-Hispanic African American: No     Diabetic: Yes     Tobacco smoker: No     Systolic Blood Pressure: 254 mmHg     Is BP treated: Yes     HDL Cholesterol: 27 mg/dL     Total Cholesterol: 137 mg/dL Chest pain:  no  ANEMIA Anemia status: controlled Etiology of anemia: Duration of anemia treatment:  Compliance with treatment: excellent compliance Iron supplementation side effects: no Severity of anemia: mild Fatigue: no Decreased exercise tolerance: no  Dyspnea on exertion: no Palpitations: no Bleeding: no Pica: no   INSOMNIA/MOOD Doing well with lorazepam. No side effects.  Denies concerns at visit today.    Past Medical History:  Diagnosis Date   Anemia    Barrett's esophagus    Brain damage    Chronic kidney disease    Dizziness    WHEN STANDING   GERD (gastroesophageal reflux disease)    Barrett's   Hyperlipidemia    Hypertension    Hypogonadism in male    Memory loss, short term    Osteoporosis    Paranoid schizophrenia ([facility])    Pre-diabetes    Tic    HANDS    Past Surgical History:  Procedure Laterality Date   CATARACT EXTRACTION W/PHACO Right 11/12/2014    Procedure: CATARACT EXTRACTION PHACO AND INTRAOCULAR LENS PLACEMENT ([place]);  Surgeon: Birder Robson, MD;  Location: [facility] ORS;  Service: Ophthalmology;  Laterality: Right;  cassette Lot # [id number]     [place]  00:45 AP 14.7 CDE 6.62   CATARACT EXTRACTION W/PHACO Left 12/10/2014   Procedure: CATARACT EXTRACTION PHACO AND INTRAOCULAR LENS PLACEMENT ([place]);  Surgeon: Birder Robson, MD;  Location: [facility] ORS;  Service: Ophthalmology;  Laterality: Left;  US:00:46.1 AP:20.0 CDE:9.22 LOT PACK #[id number]   COLONOSCOPY     COLONOSCOPY WITH PROPOFOL N/A 08/27/2021   Procedure: COLONOSCOPY WITH PROPOFOL;  Surgeon: [name], MD;  Location: [facility] ENDOSCOPY;  Service: Gastroenterology;  Laterality: N/A;  Please leave patient as 1st case if possible. Sister to bring due to brain injury. (LG)   ESOPHAGOGASTRODUODENOSCOPY N/A 08/27/2021   Procedure: ESOPHAGOGASTRODUODENOSCOPY (EGD);  Surgeon: [name], MD;  Location: [facility] ENDOSCOPY;  Service: Gastroenterology;  Laterality: N/A;   ESOPHAGOGASTRODUODENOSCOPY (EGD) WITH PROPOFOL N/A 04/23/2016   Procedure: ESOPHAGOGASTRODUODENOSCOPY (EGD) WITH PROPOFOL;  Surgeon: [name], MD;  Location: [facility] ENDOSCOPY;  Service: Endoscopy;  Laterality: N/A;    Family History  Problem Relation Age of Onset   Hypertension Father    Diabetes Father    Heart disease Father    Hyperlipidemia Father    Cancer Mother        Skin  Dementia Mother    Diabetes Brother    Stroke Maternal Grandmother    Stroke Maternal Grandfather    Heart disease Paternal Grandmother    Heart disease Paternal Grandfather     Social History   Socioeconomic History   Marital status: Single    Spouse name: Not on file   Number of children: Not on file   Years of education: Not on file   Highest education level: Not on file  Occupational History   Occupation: disability   Tobacco Use   Smoking status: Never   Smokeless tobacco: Never  Vaping Use   Vaping Use: Never used  Substance and  Sexual Activity   Alcohol use: No    Alcohol/week: 0.0 standard drinks of alcohol   Drug use: No   Sexual activity: Never  Other Topics Concern   Not on file  Social History Narrative   Sister is legal guardian    Social Determinants of Health   Financial Resource Strain: Low Risk  (07/14/2017)   Overall Financial Resource Strain (CARDIA)    Difficulty of Paying Living Expenses: Not hard at all  Food Insecurity: No Food Insecurity (07/14/2017)   Hunger Vital Sign    Worried About Running Out of Food in the Last Year: Never true    [place] in the Last Year: Never true  Transportation Needs: No Transportation Needs (07/14/2017)   PRAPARE - Hydrologist (Medical): No    Lack of Transportation (Non-Medical): No  Physical Activity: Inactive (07/14/2017)   Exercise Vital Sign    Days of Exercise per Week: 0 days    Minutes of Exercise per Session: 0 min  Stress: No Stress Concern Present (07/14/2017)   [place]    Feeling of Stress : Not at all  Social Connections: Moderately Isolated (07/14/2017)   Social Connection and Isolation Panel [NHANES]    Frequency of Communication with Friends and Family: Never    Frequency of Social Gatherings with Friends and Family: More than three times a week    Attends Religious Services: Never    Marine scientist or Organizations: No    Attends Archivist Meetings: Never    Marital Status: Never married  Intimate Partner Violence: Not At Risk (07/14/2017)   Humiliation, Afraid, Rape, and Kick questionnaire    Fear of Current or Ex-Partner: No    Emotionally Abused: No    Physically Abused: No    Sexually Abused: No    Outpatient Medications Prior to Visit  Medication Sig Dispense Refill   Cyanocobalamin (VITAMIN B 12 PO) Take 1 tablet by mouth daily.      Docusate Calcium (STOOL SOFTENER PO) Take 1 capsule by mouth daily as  needed.     esomeprazole (NEXIUM) 40 MG capsule TAKE 1 CAPSULE(40 MG) BY MOUTH DAILY 90 capsule 1   lisinopril (ZESTRIL) 10 MG tablet TAKE 1 TABLET(10 MG) BY MOUTH DAILY 90 tablet 1   meclizine (ANTIVERT) 25 MG tablet Take as needed for dizziness. 90 tablet 1   omeprazole (PRILOSEC) 40 MG capsule Take 1 capsule (40 mg total) by mouth daily. 90 capsule 3   simvastatin (ZOCOR) 10 MG tablet TAKE 1 TABLET(10 MG) BY MOUTH DAILY 90 tablet 3   triamcinolone cream (KENALOG) 0.1 % Apply 1 Application topically 2 (two) times daily. 30 g 0   LORazepam (ATIVAN) 0.5 MG tablet Take daily at bedtime. [place]  tablet 2   No facility-administered medications prior to visit.    No Known Allergies  ROS Review of Systems  Constitutional: Negative.   HENT: Negative.    Eyes: Negative.   Respiratory: Negative.    Cardiovascular: Negative.   Endocrine: Negative.   Genitourinary: Negative.   Musculoskeletal: Negative.   Neurological: Negative.   Psychiatric/Behavioral: Negative.        Objective:    Physical Exam Vitals and nursing note reviewed.  Constitutional:      Appearance: Normal appearance.  HENT:     Head: Normocephalic.  Eyes:     Conjunctiva/sclera: Conjunctivae normal.  Cardiovascular:     Rate and Rhythm: Normal rate and regular rhythm.     Pulses: Normal pulses.     Heart sounds: Normal heart sounds.  Pulmonary:     Effort: Pulmonary effort is normal.     Breath sounds: Normal breath sounds.  Musculoskeletal:     Cervical back: Normal range of motion.  Skin:    General: Skin is warm and dry.  Neurological:     Mental Status: He is alert. He is disoriented.  Psychiatric:     Comments: Slow to respond to questions. Flat affect     BP 115/73   Pulse 80   Temp 98 F (36.7 C) (Oral)   Ht _0  (1.753 m)   Wt 191 lb 8 oz (86.9 kg)   SpO2 94%   BMI 28.28 kg/m  Wt Readings from Last 3 Encounters:  04/13/22 191 lb 8 oz (86.9 kg)  02/10/22 190 lb 1.6 oz (86.2 kg)  11/23/21  185 lb 8 oz (84.1 kg)    Lab Results  Component Value Date   WBC 4.6 08/18/2021   HGB 16.4 08/18/2021   HCT 48.6 08/18/2021   MCV 86 08/18/2021   PLT 203 08/18/2021   Lab Results  Component Value Date   NA 142 11/23/2021   K 4.5 11/23/2021   CO2 24 11/23/2021   GLUCOSE 112 (H) 11/23/2021   BUN 16 11/23/2021   CREATININE 0.95 11/23/2021   BILITOT 0.4 11/23/2021   ALKPHOS 102 11/23/2021   AST 20 11/23/2021   ALT 34 11/23/2021   PROT 7.0 11/23/2021   ALBUMIN 4.6 11/23/2021   CALCIUM 9.1 11/23/2021   EGFR 91 11/23/2021   Lab Results  Component Value Date   CHOL 137 11/23/2021   Lab Results  Component Value Date   HDL 27 (L) 11/23/2021   Lab Results  Component Value Date   LDLCALC 85 11/23/2021   Lab Results  Component Value Date   TRIG 138 11/23/2021    Lab Results  Component Value Date   HGBA1C 5.9 (H) 11/23/2021      Assessment & Plan:   Problem List Items Addressed This Visit       Cardiovascular and Mediastinum   Hypertension    Chronic. Controlled.  Continue Lisinopril 35m daily. Side effects and benefits of medication discussed during visit.  Follow up in 3 month for reevaluation.  Continue to check blood pressures at home.  Bring log to next visit.  Labs ordered today.        Relevant Orders   Comp Met (CMET)     Endocrine   IFG (impaired fasting glucose)    Labs ordered today. Will make recommendations based on lab results.      Relevant Orders   HgB A1c     Genitourinary   CKD (chronic kidney disease)  Labs ordered at visit today.  Will make recommendations based on lab results.          Other   Paranoid schizophrenia ([place]) - Primary    Chronic. Controlled without medication.  Patient does take Lorazepam at bedtime to help with sleep and his mood.  Working well for him.  Discussion had with him and his sister, Clovia Cuff, who is his legal guardian the risks involved taking long term Benzodiazepines. Okay with risks and would  like to continue.   Refills sent today.  Follow up in 3 months.  Call sooner if concerns arise.       Anemia    Chronic.  Controlled.  Continue with current medication regimen.  Labs ordered today.  Return to clinic in 6 months for reevaluation.  Call sooner if concerns arise.        Relevant Orders   CBC w/Diff   Hyperlipidemia    Chronic.  Controlled.  Continue with current medication regimen of Simvastatin.  Labs ordered today.  Return to clinic in 6 months for reevaluation.  Call sooner if concerns arise.        Relevant Orders   Lipid Profile    Meds ordered this encounter  Medications   LORazepam (ATIVAN) 0.5 MG tablet    Sig: Take daily at bedtime.    Dispense:  30 tablet    Refill:  2    Order Specific Question:   Supervising Provider    Answer:   Valerie Roys [[id number]]    Follow-up: No follow-ups on file.    Jon Billings, NP

## 2022-04-13 ENCOUNTER — Ambulatory Visit (INDEPENDENT_AMBULATORY_CARE_PROVIDER_SITE_OTHER): Payer: Medicare Other | Admitting: Nurse Practitioner

## 2022-04-13 ENCOUNTER — Encounter: Payer: Self-pay | Admitting: Nurse Practitioner

## 2022-04-13 VITALS — BP 115/73 | HR 80 | Temp 98.0°F | Ht 69.0 in | Wt 191.5 lb

## 2022-04-13 DIAGNOSIS — F200 Paranoid schizophrenia: Secondary | ICD-10-CM | POA: Diagnosis not present

## 2022-04-13 DIAGNOSIS — E782 Mixed hyperlipidemia: Secondary | ICD-10-CM

## 2022-04-13 DIAGNOSIS — I10 Essential (primary) hypertension: Secondary | ICD-10-CM | POA: Diagnosis not present

## 2022-04-13 DIAGNOSIS — N181 Chronic kidney disease, stage 1: Secondary | ICD-10-CM

## 2022-04-13 DIAGNOSIS — D649 Anemia, unspecified: Secondary | ICD-10-CM | POA: Diagnosis not present

## 2022-04-13 DIAGNOSIS — R7301 Impaired fasting glucose: Secondary | ICD-10-CM

## 2022-04-13 MED ORDER — LORAZEPAM 0.5 MG PO TABS: ORAL_TABLET | ORAL | 2 refills | Status: DC

## 2022-04-13 NOTE — Assessment & Plan Note (Signed)
Chronic.  Controlled.  Continue with current medication regimen.  Labs ordered today.  Return to clinic in 6 months for reevaluation.  Call sooner if concerns arise.  ? ?

## 2022-04-13 NOTE — Assessment & Plan Note (Signed)
Chronic. Controlled.  Continue Lisinopril '10mg'$  daily. Side effects and benefits of medication discussed during visit.  Follow up in 3 month for reevaluation.  Continue to check blood pressures at home.  Bring log to next visit.  Labs ordered today.

## 2022-04-13 NOTE — Assessment & Plan Note (Signed)
Labs ordered at visit today.  Will make recommendations based on lab results.   

## 2022-04-13 NOTE — Assessment & Plan Note (Signed)
Chronic.  Controlled.  Continue with current medication regimen of Simvastatin.  Labs ordered today.  Return to clinic in 6 months for reevaluation.  Call sooner if concerns arise.

## 2022-04-13 NOTE — Assessment & Plan Note (Signed)
Chronic. Controlled without medication.  Patient does take Lorazepam at bedtime to help with sleep and his mood.  Working well for him.  Discussion had with him and his sister, George Glenn, who is his legal guardian the risks involved taking long term Benzodiazepines. Okay with risks and would like to continue.   Refills sent today.  Follow up in 3 months.  Call sooner if concerns arise.

## 2022-04-13 NOTE — Assessment & Plan Note (Signed)
Labs ordered today.  Will make recommendations based on lab results. ?

## 2022-04-14 LAB — COMPREHENSIVE METABOLIC PANEL
ALT: 31 IU/L (ref 0–44)
AST: 23 IU/L (ref 0–40)
Albumin/Globulin Ratio: 1.8 (ref 1.2–2.2)
Albumin: 4.2 g/dL (ref 3.9–4.9)
Alkaline Phosphatase: 97 IU/L (ref 44–121)
BUN/Creatinine Ratio: 21 (ref 10–24)
BUN: 20 mg/dL (ref 8–27)
Bilirubin Total: 0.4 mg/dL (ref 0.0–1.2)
CO2: 23 mmol/L (ref 20–29)
Calcium: 8.4 mg/dL — ABNORMAL LOW (ref 8.6–10.2)
Chloride: 101 mmol/L (ref 96–106)
Creatinine, Ser: 0.94 mg/dL (ref 0.76–1.27)
Globulin, Total: 2.4 g/dL (ref 1.5–4.5)
Glucose: 144 mg/dL — ABNORMAL HIGH (ref 70–99)
Potassium: 4.3 mmol/L (ref 3.5–5.2)
Sodium: 138 mmol/L (ref 134–144)
Total Protein: 6.6 g/dL (ref 6.0–8.5)
eGFR: 92 mL/min/{1.73_m2} (ref >59)

## 2022-04-14 LAB — CBC WITH DIFFERENTIAL/PLATELET
Basophils Absolute: 0.1 10*3/uL (ref 0.0–0.2)
Basos: 1 %
EOS (ABSOLUTE): 0.1 10*3/uL (ref 0.0–0.4)
Eos: 2 %
Hematocrit: 48.6 % (ref 37.5–51.0)
Hemoglobin: 16.3 g/dL (ref 13.0–17.7)
Immature Grans (Abs): 0 10*3/uL (ref 0.0–0.1)
Immature Granulocytes: 0 %
Lymphocytes Absolute: 2.2 10*3/uL (ref 0.7–3.1)
Lymphs: 33 %
MCH: 30.5 pg (ref 26.6–33.0)
MCHC: 33.5 g/dL (ref 31.5–35.7)
MCV: 91 fL (ref 79–97)
Monocytes Absolute: 0.6 10*3/uL (ref 0.1–0.9)
Monocytes: 8 %
Neutrophils Absolute: 3.7 10*3/uL (ref 1.4–7.0)
Neutrophils: 56 %
Platelets: 211 10*3/uL (ref 150–450)
RBC: 5.34 x10E6/uL (ref 4.14–5.80)
RDW: 12.5 % (ref 11.6–15.4)
WBC: 6.6 10*3/uL (ref 3.4–10.8)

## 2022-04-14 LAB — LIPID PANEL
Chol/HDL Ratio: 4.5 ratio (ref 0.0–5.0)
Cholesterol, Total: 117 mg/dL (ref 100–199)
HDL: 26 mg/dL — ABNORMAL LOW (ref >39)
LDL Chol Calc (NIH): 73 mg/dL (ref 0–99)
Triglycerides: 92 mg/dL (ref 0–149)
VLDL Cholesterol Cal: 18 mg/dL (ref 5–40)

## 2022-04-14 LAB — HEMOGLOBIN A1C
Est. average glucose Bld gHb Est-mCnc: 131 mg/dL
Hgb A1c MFr Bld: 6.2 % — ABNORMAL HIGH (ref 4.8–5.6)

## 2022-06-02 ENCOUNTER — Other Ambulatory Visit: Payer: Self-pay | Admitting: Nurse Practitioner

## 2022-06-02 NOTE — Telephone Encounter (Signed)
Requested Prescriptions  Pending Prescriptions Disp Refills   lisinopril (ZESTRIL) 10 MG tablet [Pharmacy Med Name: LISINOPRIL 10MG TABLETS] 90 tablet 0    Sig: TAKE 1 TABLET(10 MG) BY MOUTH DAILY     Cardiovascular:  ACE Inhibitors Passed - 06/02/2022  7:15 AM      Passed - Cr in normal range and within 180 days    Creatinine, Ser  Date Value Ref Range Status  04/13/2022 0.94 0.76 - 1.27 mg/dL Final         Passed - K in normal range and within 180 days    Potassium  Date Value Ref Range Status  04/13/2022 4.3 3.5 - 5.2 mmol/L Final         Passed - Patient is not pregnant      Passed - Last BP in normal range    BP Readings from Last 1 Encounters:  04/13/22 115/73         Passed - Valid encounter within last 6 months    Recent Outpatient Visits           1 month ago Paranoid schizophrenia ([place])   [place], Karen, NP   3 months ago Encounter for annual wellness exam in Medicare patient   [place], NP   6 months ago Primary hypertension   [place], NP   8 months ago Primary hypertension   [place], NP   9 months ago Paranoid schizophrenia [facility])   [place] Jon Billings, NP

## 2022-07-15 ENCOUNTER — Other Ambulatory Visit: Payer: Self-pay | Admitting: Nurse Practitioner

## 2022-07-15 NOTE — Telephone Encounter (Signed)
Requested medication (s) are due for refill today: yes  Requested medication (s) are on the active medication list: yes    Last refill: 08/18/21  #90  1 refill  Future visit scheduled no  Notes to clinic:Not delegated, please review. Thank you.  Requested Prescriptions  Pending Prescriptions Disp Refills   meclizine (ANTIVERT) 25 MG tablet [Pharmacy Med Name: MECLIZINE 25MG  RX TABLETS] 90 tablet 1    Sig: Take as needed for dizziness.     Not Delegated - Gastroenterology: Antiemetics Failed - 07/15/2022  1:45 PM      Failed - This refill cannot be delegated      Passed - Valid encounter within last 6 months    Recent Outpatient Visits           3 months ago Paranoid schizophrenia [facility])   [place], NP   5 months ago Encounter for annual wellness exam in Medicare patient   [place], NP   7 months ago Primary hypertension   [place], NP   9 months ago Primary hypertension   [place], NP   11 months ago Paranoid schizophrenia [facility])   [place] Jon Billings, NP

## 2022-08-19 ENCOUNTER — Other Ambulatory Visit: Payer: Self-pay | Admitting: Nurse Practitioner

## 2022-08-20 NOTE — Telephone Encounter (Signed)
Requested medication (s) are due for refill today - yes  Requested medication (s) are on the active medication list -yes  Future visit scheduled -no  Last refill: 04/13/22 #30 2RF  Notes to clinic: non delegated Rx  Requested Prescriptions  Pending Prescriptions Disp Refills   LORazepam (ATIVAN) 0.5 MG tablet [Pharmacy Med Name: LORAZEPAM 0.5MG  TABLETS] 30 tablet     Sig: TAKE 1 TABLET BY MOUTH EVERY NIGHT AT BEDTIME     Not Delegated - Psychiatry: Anxiolytics/Hypnotics 2 Failed - 08/19/2022  5:36 PM      Failed - This refill cannot be delegated      Failed - Urine Drug Screen completed in last 360 days      Passed - Patient is not pregnant      Passed - Valid encounter within last 6 months    Recent Outpatient Visits           4 months ago Paranoid schizophrenia ([facility])   [place] [facility] Larae Grooms, NP   6 months ago Encounter for annual wellness exam in Medicare patient   [place] [facility] Larae Grooms, NP   9 months ago Primary hypertension   [place] [facility] Larae Grooms, NP   11 months ago Primary hypertension   [place] [facility] Larae Grooms, NP   1 year ago Paranoid schizophrenia [facility])   [place] [facility] Larae Grooms, NP                 Requested Prescriptions  Pending Prescriptions Disp Refills   LORazepam (ATIVAN) 0.5 MG tablet [Pharmacy Med Name: LORAZEPAM 0.5MG  TABLETS] 30 tablet     Sig: TAKE 1 TABLET BY MOUTH EVERY NIGHT AT BEDTIME     Not Delegated - Psychiatry: Anxiolytics/Hypnotics 2 Failed - 08/19/2022  5:36 PM      Failed - This refill cannot be delegated      Failed - Urine Drug Screen completed in last 360 days      Passed - Patient is not pregnant      Passed - Valid encounter within last 6 months    Recent Outpatient Visits           4 months ago Paranoid schizophrenia (HCC)   [place] [facility]  Larae Grooms, NP   6 months ago Encounter for annual wellness exam in Medicare patient   [place] [facility] Larae Grooms, NP   9 months ago Primary hypertension   [place] [facility] Larae Grooms, NP   11 months ago Primary hypertension   [place] [facility] Larae Grooms, NP   1 year ago Paranoid schizophrenia [facility])   [place] [facility] Larae Grooms, NP

## 2022-08-25 ENCOUNTER — Telehealth: Payer: Self-pay

## 2022-08-25 NOTE — Telephone Encounter (Signed)
Please call and schedule the patient a follow up appointment per Clydie Braun.

## 2022-08-25 NOTE — Telephone Encounter (Signed)
-----   Message from Larae Grooms, NP sent at 08/23/2022  8:13 AM EDT ----- Can we call George Glenn and let her know he hasn't been seen since December and needs another appt.

## 2022-08-26 ENCOUNTER — Encounter: Payer: Self-pay | Admitting: Nurse Practitioner

## 2022-08-26 ENCOUNTER — Ambulatory Visit (INDEPENDENT_AMBULATORY_CARE_PROVIDER_SITE_OTHER): Payer: 59 | Admitting: Nurse Practitioner

## 2022-08-26 VITALS — BP 114/76 | HR 77 | Temp 97.7°F | Wt 187.6 lb

## 2022-08-26 DIAGNOSIS — N181 Chronic kidney disease, stage 1: Secondary | ICD-10-CM

## 2022-08-26 DIAGNOSIS — B351 Tinea unguium: Secondary | ICD-10-CM

## 2022-08-26 DIAGNOSIS — E782 Mixed hyperlipidemia: Secondary | ICD-10-CM | POA: Diagnosis not present

## 2022-08-26 DIAGNOSIS — R7301 Impaired fasting glucose: Secondary | ICD-10-CM

## 2022-08-26 DIAGNOSIS — F200 Paranoid schizophrenia: Secondary | ICD-10-CM | POA: Diagnosis not present

## 2022-08-26 DIAGNOSIS — I10 Essential (primary) hypertension: Secondary | ICD-10-CM | POA: Diagnosis not present

## 2022-08-26 MED ORDER — EMPAGLIFLOZIN 10 MG PO TABS: 10.0000 mg | ORAL_TABLET | Freq: Every day | ORAL | 1 refills | Status: DC

## 2022-08-26 NOTE — Assessment & Plan Note (Signed)
Chronic. Controlled.  Continue Lisinopril 10mg  daily.  Follow up in 3 month for reevaluation.  Continue to check blood pressures at home.  Bring log to next visit.  Labs ordered today.

## 2022-08-26 NOTE — Assessment & Plan Note (Addendum)
Chronic. Controlled without medication.  Patient does take Lorazepam at bedtime to help with sleep and his mood.  Working well for him.  Discussion had with him and his sister, Darlyn Read, who is his legal guardian the risks involved taking long term Benzodiazepines. Okay with risks and would like to continue.  Patient has been taking medication for many years.  Follow up in 3 months.  Call sooner if concerns arise.

## 2022-08-26 NOTE — Assessment & Plan Note (Signed)
Chronic.  Controlled.  Continue with current medication regimen of Simvastatin.  Labs ordered today.  Return to clinic in 3 months for reevaluation.  Call sooner if concerns arise.   

## 2022-08-26 NOTE — Progress Notes (Signed)
Established Patient Office Visit  Subjective:  Patient ID: George Glenn, male    DOB: 11/08/1960  Age: 62 y.o. MRN: [id number]  CC:  Chief Complaint  Patient presents with   Anxiety    HPI George Glenn presents for follow-up with hyperlipidemia and insomnia. His sister says that he is doing well.  They have change their diet to help with the triglycerides.  Patient is doing well.   HYPERLIPIDEMIA Hyperlipidemia status: chronic Satisfied with current treatment?  yes Side effects:  no Medication compliance: excellent compliance Past cholesterol meds: simvastatin (zocor) Supplements: none Aspirin:  no The ASCVD Risk score (Arnett DK, et al., 2019) failed to calculate for the following reasons:   The valid total cholesterol range is 130 to 320 mg/dL Chest pain:  no  ANEMIA Anemia status: controlled Etiology of anemia: Duration of anemia treatment:  Compliance with treatment: excellent compliance Iron supplementation side effects: no Severity of anemia: mild Fatigue: no Decreased exercise tolerance: no  Dyspnea on exertion: no Palpitations: no Bleeding: no Pica: no  INSOMNIA/MOOD Doing well with lorazepam. No side effects.  Denies concerns at visit today.    CHRONIC KIDNEY DISEASE CKD status: stable Medications renally dose: yes Previous renal evaluation: no Pneumovax:  Up to Date Influenza Vaccine:  Up to Date   Past Medical History:  Diagnosis Date   Anemia    Barrett's esophagus    Brain damage    Chronic kidney disease    Dizziness    WHEN STANDING   GERD (gastroesophageal reflux disease)    Barrett's   Hyperlipidemia    Hypertension    Hypogonadism in male    Memory loss, short term    Osteoporosis    Paranoid schizophrenia (HCC)    Pre-diabetes    Tic    HANDS    Past Surgical History:  Procedure Laterality Date   CATARACT EXTRACTION W/PHACO Right 11/12/2014   Procedure: CATARACT EXTRACTION PHACO AND INTRAOCULAR LENS PLACEMENT  (IOC);  Surgeon: Galen Manila, MD;  Location: [facility] ORS;  Service: Ophthalmology;  Laterality: Right;  cassette Lot # [id number]     [place]  00:45 AP 14.7 CDE 6.62   CATARACT EXTRACTION W/PHACO Left 12/10/2014   Procedure: CATARACT EXTRACTION PHACO AND INTRAOCULAR LENS PLACEMENT (IOC);  Surgeon: Galen Manila, MD;  Location: [facility] ORS;  Service: Ophthalmology;  Laterality: Left;  US:00:46.1 AP:20.0 CDE:9.22 LOT PACK #[id number]   COLONOSCOPY     COLONOSCOPY WITH PROPOFOL N/A 08/27/2021   Procedure: COLONOSCOPY WITH PROPOFOL;  Surgeon: Wyline Mood, MD;  Location: [facility] ENDOSCOPY;  Service: Gastroenterology;  Laterality: N/A;  Please leave patient as 1st case if possible. Sister to bring due to brain injury. (LG)   ESOPHAGOGASTRODUODENOSCOPY N/A 08/27/2021   Procedure: ESOPHAGOGASTRODUODENOSCOPY (EGD);  Surgeon: Wyline Mood, MD;  Location: [facility] ENDOSCOPY;  Service: Gastroenterology;  Laterality: N/A;   ESOPHAGOGASTRODUODENOSCOPY (EGD) WITH PROPOFOL N/A 04/23/2016   Procedure: ESOPHAGOGASTRODUODENOSCOPY (EGD) WITH PROPOFOL;  Surgeon: Wyline Mood, MD;  Location: [facility] ENDOSCOPY;  Service: Endoscopy;  Laterality: N/A;    Family History  Problem Relation Age of Onset   Hypertension Father    Diabetes Father    Heart disease Father    Hyperlipidemia Father    Cancer Mother        Skin   Dementia Mother    Diabetes Brother    Stroke Maternal Grandmother    Stroke Maternal Grandfather    Heart disease Paternal Grandmother    Heart disease Paternal Grandfather     Social History  Socioeconomic History   Marital status: Single    Spouse name: Not on file   Number of children: Not on file   Years of education: Not on file   Highest education level: Not on file  Occupational History   Occupation: disability   Tobacco Use   Smoking status: Never   Smokeless tobacco: Never  Vaping Use   Vaping Use: Never used  Substance and Sexual Activity   Alcohol use: No    Alcohol/week: 0.0 standard drinks  of alcohol   Drug use: No   Sexual activity: Never  Other Topics Concern   Not on file  Social History Narrative   Sister is legal guardian    Social Determinants of Health   Financial Resource Strain: Low Risk  (07/14/2017)   Overall Financial Resource Strain (CARDIA)    Difficulty of Paying Living Expenses: Not hard at all  Food Insecurity: No Food Insecurity (07/14/2017)   Hunger Vital Sign    Worried About Running Out of Food in the Last Year: Never true    Ran Out of Food in the Last Year: Never true  Transportation Needs: No Transportation Needs (07/14/2017)   PRAPARE - Administrator, Civil Service (Medical): No    Lack of Transportation (Non-Medical): No  Physical Activity: Inactive (07/14/2017)   Exercise Vital Sign    Days of Exercise per Week: 0 days    Minutes of Exercise per Session: 0 min  Stress: No Stress Concern Present (07/14/2017)   [employer] of Occupational Health - Occupational Stress Questionnaire    Feeling of Stress : Not at all  Social Connections: Moderately Isolated (07/14/2017)   Social Connection and Isolation Panel [NHANES]    Frequency of Communication with Friends and Family: Never    Frequency of Social Gatherings with Friends and Family: More than three times a week    Attends Religious Services: Never    Database administrator or Organizations: No    Attends Banker Meetings: Never    Marital Status: Never married  Intimate Partner Violence: Not At Risk (07/14/2017)   Humiliation, Afraid, Rape, and Kick questionnaire    Fear of Current or Ex-Partner: No    Emotionally Abused: No    Physically Abused: No    Sexually Abused: No    Outpatient Medications Prior to Visit  Medication Sig Dispense Refill   Cyanocobalamin (VITAMIN B 12 PO) Take 1 tablet by mouth daily.      Docusate Calcium (STOOL SOFTENER PO) Take 1 capsule by mouth daily as needed.     esomeprazole (NEXIUM) 40 MG capsule TAKE 1 CAPSULE(40 MG) BY  MOUTH DAILY 90 capsule 1   lisinopril (ZESTRIL) 10 MG tablet TAKE 1 TABLET(10 MG) BY MOUTH DAILY 90 tablet 0   LORazepam (ATIVAN) 0.5 MG tablet TAKE 1 TABLET BY MOUTH EVERY NIGHT AT BEDTIME 30 tablet 0   meclizine (ANTIVERT) 25 MG tablet TAKE AS NEEDED FOR DIZZINESS 90 tablet 1   omeprazole (PRILOSEC) 40 MG capsule Take 1 capsule (40 mg total) by mouth daily. 90 capsule 3   simvastatin (ZOCOR) 10 MG tablet TAKE 1 TABLET(10 MG) BY MOUTH DAILY 90 tablet 3   triamcinolone cream (KENALOG) 0.1 % Apply 1 Application topically 2 (two) times daily. 30 g 0   No facility-administered medications prior to visit.    No Known Allergies  ROS Review of Systems  Constitutional: Negative.   HENT: Negative.    Eyes: Negative.   Respiratory:  Negative.    Cardiovascular: Negative.   Endocrine: Negative.   Genitourinary: Negative.   Musculoskeletal: Negative.   Skin:        Toenail fungus  Neurological: Negative.   Psychiatric/Behavioral: Negative.        Objective:    Physical Exam Vitals and nursing note reviewed.  Constitutional:      Appearance: Normal appearance.  HENT:     Head: Normocephalic.  Eyes:     Conjunctiva/sclera: Conjunctivae normal.  Cardiovascular:     Rate and Rhythm: Normal rate and regular rhythm.     Pulses: Normal pulses.     Heart sounds: Normal heart sounds.  Pulmonary:     Effort: Pulmonary effort is normal.     Breath sounds: Normal breath sounds.  Musculoskeletal:     Cervical back: Normal range of motion.  Skin:    General: Skin is warm and dry.  Neurological:     Mental Status: He is alert. He is disoriented.  Psychiatric:     Comments: Slow to respond to questions. Flat affect     BP 114/76   Pulse 77   Temp 97.7 F (36.5 C) (Oral)   Wt 187 lb 9.6 oz (85.1 kg)   SpO2 97%   BMI 27.70 kg/m  Wt Readings from Last 3 Encounters:  08/26/22 187 lb 9.6 oz (85.1 kg)  04/13/22 191 lb 8 oz (86.9 kg)  02/10/22 190 lb 1.6 oz (86.2 kg)    Lab  Results  Component Value Date   WBC 6.6 04/13/2022   HGB 16.3 04/13/2022   HCT 48.6 04/13/2022   MCV 91 04/13/2022   PLT 211 04/13/2022   Lab Results  Component Value Date   NA 138 04/13/2022   K 4.3 04/13/2022   CO2 23 04/13/2022   GLUCOSE 144 (H) 04/13/2022   BUN 20 04/13/2022   CREATININE 0.94 04/13/2022   BILITOT 0.4 04/13/2022   ALKPHOS 97 04/13/2022   AST 23 04/13/2022   ALT 31 04/13/2022   PROT 6.6 04/13/2022   ALBUMIN 4.2 04/13/2022   CALCIUM 8.4 (L) 04/13/2022   EGFR 92 04/13/2022   Lab Results  Component Value Date   CHOL 117 04/13/2022   Lab Results  Component Value Date   HDL 26 (L) 04/13/2022   Lab Results  Component Value Date   LDLCALC 73 04/13/2022   Lab Results  Component Value Date   TRIG 92 04/13/2022    Lab Results  Component Value Date   HGBA1C 6.2 (H) 04/13/2022      Assessment & Plan:   Problem List Items Addressed This Visit       Cardiovascular and Mediastinum   Hypertension    Chronic. Controlled.  Continue Lisinopril 10mg  daily.  Follow up in 3 month for reevaluation.  Continue to check blood pressures at home.  Bring log to next visit.  Labs ordered today.        Relevant Orders   Comp Met (CMET)     Endocrine   IFG (impaired fasting glucose)    Labs ordered at visit today.  Will make recommendations based on lab results.        Relevant Orders   HgB A1c     Genitourinary   CKD (chronic kidney disease)   Relevant Orders   CBC w/Diff     Other   Paranoid schizophrenia (HCC) - Primary    Chronic. Controlled without medication.  Patient does take Lorazepam at bedtime to help with sleep  and his mood.  Working well for him.  Discussion had with him and his sister, Darlyn Read, who is his legal guardian the risks involved taking long term Benzodiazepines. Okay with risks and would like to continue.  Patient has been taking medication for many years.  Follow up in 3 months.  Call sooner if concerns arise.        Hyperlipidemia    Chronic.  Controlled.  Continue with current medication regimen of Simvastatin.  Labs ordered today.  Return to clinic in 3 months for reevaluation.  Call sooner if concerns arise.        Relevant Orders   Lipid Profile   Other Visit Diagnoses     Toenail fungus       New referral placed for patient to see Podiatry.   Relevant Orders   Ambulatory referral to Podiatry       Meds ordered this encounter  Medications   empagliflozin (JARDIANCE) 10 MG TABS tablet    Sig: Take 1 tablet (10 mg total) by mouth daily before breakfast.    Dispense:  90 tablet    Refill:  1    Order Specific Question:   Supervising Provider    Answer:   Dorcas Carrow [[id number]]    Follow-up: Return in about 3 months (around 11/26/2022) for HTN, HLD, DM2 FU.    Larae Grooms, NP

## 2022-08-26 NOTE — Assessment & Plan Note (Signed)
Labs ordered at visit today.  Will make recommendations based on lab results.   

## 2022-08-27 LAB — CBC WITH DIFFERENTIAL/PLATELET
Basophils Absolute: 0 10*3/uL (ref 0.0–0.2)
Basos: 1 %
EOS (ABSOLUTE): 0.1 10*3/uL (ref 0.0–0.4)
Eos: 2 %
Hematocrit: 49.9 % (ref 37.5–51.0)
Hemoglobin: 16.2 g/dL (ref 13.0–17.7)
Immature Grans (Abs): 0 10*3/uL (ref 0.0–0.1)
Immature Granulocytes: 0 %
Lymphocytes Absolute: 1.8 10*3/uL (ref 0.7–3.1)
Lymphs: 38 %
MCH: 29 pg (ref 26.6–33.0)
MCHC: 32.5 g/dL (ref 31.5–35.7)
MCV: 89 fL (ref 79–97)
Monocytes Absolute: 0.4 10*3/uL (ref 0.1–0.9)
Monocytes: 8 %
Neutrophils Absolute: 2.5 10*3/uL (ref 1.4–7.0)
Neutrophils: 51 %
Platelets: 213 10*3/uL (ref 150–450)
RBC: 5.58 x10E6/uL (ref 4.14–5.80)
RDW: 13.1 % (ref 11.6–15.4)
WBC: 4.9 10*3/uL (ref 3.4–10.8)

## 2022-08-27 LAB — LIPID PANEL
Chol/HDL Ratio: 5.4 ratio — ABNORMAL HIGH (ref 0.0–5.0)
Cholesterol, Total: 134 mg/dL (ref 100–199)
HDL: 25 mg/dL — ABNORMAL LOW (ref >39)
LDL Chol Calc (NIH): 89 mg/dL (ref 0–99)
Triglycerides: 109 mg/dL (ref 0–149)
VLDL Cholesterol Cal: 20 mg/dL (ref 5–40)

## 2022-08-27 LAB — COMPREHENSIVE METABOLIC PANEL
ALT: 30 IU/L (ref 0–44)
AST: 23 IU/L (ref 0–40)
Albumin/Globulin Ratio: 1.7 (ref 1.2–2.2)
Albumin: 4.5 g/dL (ref 3.9–4.9)
Alkaline Phosphatase: 98 IU/L (ref 44–121)
BUN/Creatinine Ratio: 21 (ref 10–24)
BUN: 23 mg/dL (ref 8–27)
Bilirubin Total: 0.5 mg/dL (ref 0.0–1.2)
CO2: 24 mmol/L (ref 20–29)
Calcium: 9.2 mg/dL (ref 8.6–10.2)
Chloride: 100 mmol/L (ref 96–106)
Creatinine, Ser: 1.08 mg/dL (ref 0.76–1.27)
Globulin, Total: 2.7 g/dL (ref 1.5–4.5)
Glucose: 107 mg/dL — ABNORMAL HIGH (ref 70–99)
Potassium: 4.5 mmol/L (ref 3.5–5.2)
Sodium: 140 mmol/L (ref 134–144)
Total Protein: 7.2 g/dL (ref 6.0–8.5)
eGFR: 78 mL/min/{1.73_m2} (ref >59)

## 2022-08-27 LAB — HEMOGLOBIN A1C
Est. average glucose Bld gHb Est-mCnc: 128 mg/dL
Hgb A1c MFr Bld: 6.1 % — ABNORMAL HIGH (ref 4.8–5.6)

## 2022-08-27 NOTE — Progress Notes (Signed)
Hi [name]. It was nice to see you and Ed yesterday.  Your lab work looks good.  No concerns at this time. Continue with your current medication regimen.  Follow up as discussed.  Please let me know if you have any questions.

## 2022-09-03 ENCOUNTER — Other Ambulatory Visit: Payer: Self-pay | Admitting: Nurse Practitioner

## 2022-09-03 NOTE — Telephone Encounter (Signed)
Requested medication (s) are due for refill today: routing for review  Requested medication (s) are on the active medication list: yes  Last refill:  09/22/21  Future visit scheduled: no  Notes to clinic:  Unable to refill per protocol, last refill by another provider not at this practice.     Requested Prescriptions  Pending Prescriptions Disp Refills   omeprazole (PRILOSEC) 40 MG capsule [Pharmacy Med Name: OMEPRAZOLE 40MG  CAPSULES] 90 capsule 3    Sig: TAKE 1 CAPSULE(40 MG) BY MOUTH DAILY     There is no refill protocol information for this order

## 2022-09-24 DIAGNOSIS — B351 Tinea unguium: Secondary | ICD-10-CM | POA: Diagnosis not present

## 2022-09-24 DIAGNOSIS — M79675 Pain in left toe(s): Secondary | ICD-10-CM | POA: Diagnosis not present

## 2022-09-24 DIAGNOSIS — M79674 Pain in right toe(s): Secondary | ICD-10-CM | POA: Diagnosis not present

## 2022-09-30 ENCOUNTER — Other Ambulatory Visit: Payer: Self-pay | Admitting: Nurse Practitioner

## 2022-09-30 NOTE — Telephone Encounter (Signed)
Medication Refill - Medication: LORazepam (ATIVAN) 0.5 MG tablet   Has the patient contacted their pharmacy? Yes.   (Agent: If no, request that the patient contact the pharmacy for the refill. If patient does not wish to contact the pharmacy document the reason why and proceed with request.) (Agent: If yes, when and what did the pharmacy advise?) told to call dr  Preferred Pharmacy (with phone number or street name):  [facility] #[id number] [place], [place] - [address] AT [facility] OF [address] [facility] Phone: [phone]  Fax: [phone]     Has the patient been seen for an appointment in the last year OR does the patient have an upcoming appointment? Yes.   08/26/22  Agent: Please be advised that RX refills may take up to 3 business days. We ask that you follow-up with your pharmacy.

## 2022-09-30 NOTE — Telephone Encounter (Signed)
   Notes to clinic: see below- duplicate request, non delegated Rx  Requested Prescriptions  Pending Prescriptions Disp Refills   LORazepam (ATIVAN) 0.5 MG tablet [Pharmacy Med Name: LORAZEPAM 0.5MG  TABLETS] 30 tablet     Sig: TAKE 1 TABLET BY MOUTH EVERY NIGHT AT BEDTIME     Not Delegated - Psychiatry: Anxiolytics/Hypnotics 2 Failed - 09/30/2022 10:50 AM      Failed - This refill cannot be delegated      Failed - Urine Drug Screen completed in last 360 days      Passed - Patient is not pregnant      Passed - Valid encounter within last 6 months    Recent Outpatient Visits           1 month ago Paranoid schizophrenia (HCC)   [facility] Larae Grooms, NP   5 months ago Paranoid schizophrenia [facility])   [place] [facility] Larae Grooms, NP   7 months ago Encounter for annual wellness exam in Medicare patient   [place] [facility] Larae Grooms, NP   10 months ago Primary hypertension   [place] [facility] Larae Grooms, NP   1 year ago Primary hypertension   [place] [facility] Larae Grooms, NP                 Requested Prescriptions  Pending Prescriptions Disp Refills   LORazepam (ATIVAN) 0.5 MG tablet [Pharmacy Med Name: LORAZEPAM 0.5MG  TABLETS] 30 tablet     Sig: TAKE 1 TABLET BY MOUTH EVERY NIGHT AT BEDTIME     Not Delegated - Psychiatry: Anxiolytics/Hypnotics 2 Failed - 09/30/2022 10:50 AM      Failed - This refill cannot be delegated      Failed - Urine Drug Screen completed in last 360 days      Passed - Patient is not pregnant      Passed - Valid encounter within last 6 months    Recent Outpatient Visits           1 month ago Paranoid schizophrenia (HCC)   [place] [facility] Larae Grooms, NP   5 months ago Paranoid schizophrenia [facility])   [place] [facility] Larae Grooms, NP   7 months ago Encounter  for annual wellness exam in Medicare patient   [place] [facility] Larae Grooms, NP   10 months ago Primary hypertension   [place] [facility] Larae Grooms, NP   1 year ago Primary hypertension   [place] [facility] Larae Grooms, NP

## 2022-09-30 NOTE — Telephone Encounter (Signed)
Requested medication (s) are due for refill today - yes  Requested medication (s) are on the active medication list -yes  Future visit scheduled -no  Last refill: 08/23/22 #30  Notes to clinic: non delegated Rx  Requested Prescriptions  Pending Prescriptions Disp Refills   LORazepam (ATIVAN) 0.5 MG tablet [Pharmacy Med Name: LORAZEPAM 0.5MG  TABLETS] 30 tablet     Sig: TAKE 1 TABLET BY MOUTH EVERY NIGHT AT BEDTIME     Not Delegated - Psychiatry: Anxiolytics/Hypnotics 2 Failed - 09/30/2022 10:50 AM      Failed - This refill cannot be delegated      Failed - Urine Drug Screen completed in last 360 days      Passed - Patient is not pregnant      Passed - Valid encounter within last 6 months    Recent Outpatient Visits           1 month ago Paranoid schizophrenia (HCC)   [place] [facility] Larae Grooms, NP   5 months ago Paranoid schizophrenia [facility])   [place] [facility] Larae Grooms, NP   7 months ago Encounter for annual wellness exam in Medicare patient   [place] [facility] Larae Grooms, NP   10 months ago Primary hypertension   [place] [facility] Larae Grooms, NP   1 year ago Primary hypertension   [place] [facility] Larae Grooms, NP                 Requested Prescriptions  Pending Prescriptions Disp Refills   LORazepam (ATIVAN) 0.5 MG tablet [Pharmacy Med Name: LORAZEPAM 0.5MG  TABLETS] 30 tablet     Sig: TAKE 1 TABLET BY MOUTH EVERY NIGHT AT BEDTIME     Not Delegated - Psychiatry: Anxiolytics/Hypnotics 2 Failed - 09/30/2022 10:50 AM      Failed - This refill cannot be delegated      Failed - Urine Drug Screen completed in last 360 days      Passed - Patient is not pregnant      Passed - Valid encounter within last 6 months    Recent Outpatient Visits           1 month ago Paranoid schizophrenia (HCC)   [place] [facility]  Larae Grooms, NP   5 months ago Paranoid schizophrenia [facility])   [place] [facility] Larae Grooms, NP   7 months ago Encounter for annual wellness exam in Medicare patient   [place] [facility] Larae Grooms, NP   10 months ago Primary hypertension   [place] [facility] Larae Grooms, NP   1 year ago Primary hypertension    [facility] Larae Grooms, NP

## 2022-11-16 ENCOUNTER — Other Ambulatory Visit: Payer: Self-pay | Admitting: Nurse Practitioner

## 2022-11-17 NOTE — Telephone Encounter (Signed)
Requested Prescriptions  Pending Prescriptions Disp Refills   lisinopril (ZESTRIL) 10 MG tablet [Pharmacy Med Name: LISINOPRIL 10MG  TABLETS] 90 tablet 0    Sig: TAKE 1 TABLET(10 MG) BY MOUTH DAILY     Cardiovascular:  ACE Inhibitors Passed - 11/16/2022  9:51 AM      Passed - Cr in normal range and within 180 days    Creatinine, Ser  Date Value Ref Range Status  08/26/2022 1.08 0.76 - 1.27 mg/dL Final         Passed - K in normal range and within 180 days    Potassium  Date Value Ref Range Status  08/26/2022 4.5 3.5 - 5.2 mmol/L Final         Passed - Patient is not pregnant      Passed - Last BP in normal range    BP Readings from Last 1 Encounters:  08/26/22 114/76         Passed - Valid encounter within last 6 months    Recent Outpatient Visits           2 months ago Paranoid schizophrenia [facility])   [place] [facility] Larae Grooms, NP   7 months ago Paranoid schizophrenia [facility])   [place] [facility] Larae Grooms, NP   9 months ago Encounter for annual wellness exam in Medicare patient   [place] [facility] Larae Grooms, NP   11 months ago Primary hypertension   [place] [facility] Larae Grooms, NP   1 year ago Primary hypertension   [place] [facility] Larae Grooms, NP       Future Appointments             In 2 months Larae Grooms, NP [place] [facility]

## 2022-12-13 ENCOUNTER — Other Ambulatory Visit: Payer: Self-pay | Admitting: Nurse Practitioner

## 2022-12-13 NOTE — Telephone Encounter (Signed)
Medication Refill - Medication: simvastatin (ZOCOR) 10 MG tablet   Pt is completely out of current supply    Has the patient contacted their pharmacy? Yes.   (Agent: If no, request that the patient contact the pharmacy for the refill. If patient does not wish to contact the pharmacy document the reason why and proceed with request.) (Agent: If yes, when and what did the pharmacy advise?)  Preferred Pharmacy (with phone number or street name):  [facility] #[id number] [place]ree D[place], [place] - [address] AT [facility] OF [address] [facility]  [address] [place] [address]  Phone: [phone] Fax: [phone]   Has the patient been seen for an appointment in the last year OR does the patient have an upcoming appointment? Yes.    Agent: Please be advised that RX refills may take up to 3 business days. We ask that you follow-up with your pharmacy.

## 2022-12-14 MED ORDER — SIMVASTATIN 10 MG PO TABS: ORAL_TABLET | ORAL | 0 refills | Status: DC

## 2022-12-14 NOTE — Telephone Encounter (Signed)
Requested Prescriptions  Pending Prescriptions Disp Refills   simvastatin (ZOCOR) 10 MG tablet 90 tablet 0    Sig: TAKE 1 TABLET(10 MG) BY MOUTH DAILY     Cardiovascular:  Antilipid - Statins Failed - 12/13/2022  9:52 AM      Failed - Lipid Panel in normal range within the last 12 months    Cholesterol, Total  Date Value Ref Range Status  08/26/2022 134 100 - 199 mg/dL Final   LDL Chol Calc (NIH)  Date Value Ref Range Status  08/26/2022 89 0 - 99 mg/dL Final   HDL  Date Value Ref Range Status  08/26/2022 25 (L) >39 mg/dL Final   Triglycerides  Date Value Ref Range Status  08/26/2022 109 0 - 149 mg/dL Final         Passed - Patient is not pregnant      Passed - Valid encounter within last 12 months    Recent Outpatient Visits           3 months ago Paranoid schizophrenia (HCC)   [place] [facility] Larae Grooms, NP   8 months ago Paranoid [facility])   [place] [facility] Larae Grooms, NP   10 months ago Encounter for annual wellness exam in Medicare patient   [place] [facility] Larae Grooms, NP   1 year ago Primary hypertension   [place] [facility] Larae Grooms, NP   1 year ago Primary hypertension   [place] [facility] Larae Grooms, NP       Future Appointments             In 1 month Larae Grooms, NP [place] [facility]

## 2022-12-15 ENCOUNTER — Other Ambulatory Visit: Payer: Self-pay | Admitting: Nurse Practitioner

## 2022-12-15 NOTE — Telephone Encounter (Signed)
Medication Refill - Medication: lisinopril (ZESTRIL) 10 MG tablet   Has the patient contacted their pharmacy? No.  Preferred Pharmacy (with phone number or street name):  [facility] #[id number] - [place], [place] - [address] AT [facility] OF [address] [facility] Phone: [phone]  Fax: [phone]     Has the patient been seen for an appointment in the last year OR does the patient have an upcoming appointment? Yes.    Agent: Please be advised that RX refills may take up to 3 business days. We ask that you follow-up with your pharmacy.  Patients sister called stated patient has been out of medication for days

## 2022-12-16 ENCOUNTER — Telehealth: Payer: Self-pay | Admitting: Nurse Practitioner

## 2022-12-16 ENCOUNTER — Other Ambulatory Visit: Payer: Self-pay | Admitting: Nurse Practitioner

## 2022-12-16 NOTE — Telephone Encounter (Signed)
Refilled 11/17/22 # 90. Requested Prescriptions  Refused Prescriptions Disp Refills   lisinopril (ZESTRIL) 10 MG tablet 90 tablet 0     Cardiovascular:  ACE Inhibitors Passed - 12/15/2022  2:27 PM      Passed - Cr in normal range and within 180 days    Creatinine, Ser  Date Value Ref Range Status  08/26/2022 1.08 0.76 - 1.27 mg/dL Final         Passed - K in normal range and within 180 days    Potassium  Date Value Ref Range Status  08/26/2022 4.5 3.5 - 5.2 mmol/L Final         Passed - Patient is not pregnant      Passed - Last BP in normal range    BP Readings from Last 1 Encounters:  08/26/22 114/76         Passed - Valid encounter within last 6 months    Recent Outpatient Visits           3 months ago Paranoid schizophrenia [facility])   [place] [facility] Larae Grooms, NP   8 months ago Paranoid schizophrenia [facility])   [facility] Larae Grooms, NP   10 months ago Encounter for annual wellness exam in Medicare patient   [place] [facility] Larae Grooms, NP   1 year ago Primary hypertension   [place] [facility] Larae Grooms, NP   1 year ago Primary hypertension    [place] [facility] Larae Grooms, NP       Future Appointments             In 1 month Larae Grooms, NP  [facility]

## 2022-12-16 NOTE — Telephone Encounter (Signed)
[facility] Pharmacist [place] reports that they never received the Rx refill request for lisinopril (ZESTRIL) 10 MG tablet. [facility] requests that the Rx be re-sent to them so they can fill the Rx.  [facility] #[id number] [place]ree D[place], [place] - [address] AT [facility] OF [address] [facility] Phone: [phone]  Fax: [phone]

## 2022-12-16 NOTE — Telephone Encounter (Signed)
Resent to the pharmacy in a separate refill encounter received today from the pharmacy.

## 2022-12-16 NOTE — Telephone Encounter (Signed)
Resending.  [facility] Pharmacist [place] reports that they never received the Rx refill request for lisinopril (ZESTRIL) 10 MG tablet. [facility] requests that the Rx be re-sent to them so they can fill the Rx.   [id number] [place], [place] - [address] AT [facility] OF [address] [facility] Phone: [phone]  Fax: [phone]        Requested Prescriptions  Pending Prescriptions Disp Refills   lisinopril (ZESTRIL) 10 MG tablet [Pharmacy Med Name: LISINOPRIL 10MG  TABLETS] 90 tablet 0    Sig: TAKE 1 TABLET(10 MG) BY MOUTH DAILY     Cardiovascular:  ACE Inhibitors Passed - 12/16/2022  3:57 PM      Passed - Cr in normal range and within 180 days    Creatinine, Ser  Date Value Ref Range Status  08/26/2022 1.08 0.76 - 1.27 mg/dL Final         Passed - K in normal range and within 180 days    Potassium  Date Value Ref Range Status  08/26/2022 4.5 3.5 - 5.2 mmol/L Final         Passed - Patient is not pregnant      Passed - Last BP in normal range    BP Readings from Last 1 Encounters:  08/26/22 114/76         Passed - Valid encounter within last 6 months    Recent Outpatient Visits           3 months ago Paranoid schizophrenia [facility])   [facility] Larae Grooms, NP   8 months ago Paranoid schizophrenia [facility])   [facility] Larae Grooms, NP   10 months ago Encounter for annual wellness exam in Medicare patient   [place] [facility] Larae Grooms, NP   1 year ago Primary hypertension   [place] [facility] Larae Grooms, NP   1 year ago Primary hypertension   [place] [facility] Larae Grooms, NP       Future Appointments             In 1 month Larae Grooms, NP [facility]

## 2023-01-24 ENCOUNTER — Ambulatory Visit: Payer: 59 | Admitting: Nurse Practitioner

## 2023-01-24 NOTE — Progress Notes (Deleted)
Established Patient Office Visit  Subjective:  Patient ID: George Glenn, male    DOB: October 03, 1960  Age: 62 y.o. MRN: [id number]  CC:  No chief complaint on file.   HPI George Glenn presents for follow-up with hyperlipidemia and insomnia. His sister says that he is doing well.  They have change their diet to help with the triglycerides.  Patient is doing well.   HYPERLIPIDEMIA Hyperlipidemia status: chronic Satisfied with current treatment?  yes Side effects:  no Medication compliance: excellent compliance Past cholesterol meds: simvastatin (zocor) Supplements: none Aspirin:  no The 10-year ASCVD risk score (Arnett DK, et al., 2019) is: 19.8%   Values used to calculate the score:     Age: 66 years     Sex: Male     Is Non-Hispanic African American: No     Diabetic: Yes     Tobacco smoker: No     Systolic Blood Pressure: 108 mmHg     Is BP treated: Yes     HDL Cholesterol: 25 mg/dL     Total Cholesterol: 134 mg/dL Chest pain:  no  ANEMIA Anemia status: controlled Etiology of anemia: Duration of anemia treatment:  Compliance with treatment: excellent compliance Iron supplementation side effects: no Severity of anemia: mild Fatigue: no Decreased exercise tolerance: no  Dyspnea on exertion: no Palpitations: no Bleeding: no Pica: no  INSOMNIA/MOOD Doing well with lorazepam. No side effects.  Denies concerns at visit today.    CHRONIC KIDNEY DISEASE CKD status: stable Medications renally dose: yes Previous renal evaluation: no Pneumovax:  Up to Date Influenza Vaccine:  Up to Date   Past Medical History:  Diagnosis Date  . Anemia   . Barrett's esophagus   . Brain damage   . Chronic kidney disease   . Dizziness    WHEN STANDING  . GERD (gastroesophageal reflux disease)    Barrett's  . Hyperlipidemia   . Hypertension   . Hypogonadism in male   . Memory loss, short term   . Osteoporosis   . Paranoid schizophrenia (HCC)   . Pre-diabetes   . Tic     HANDS    Past Surgical History:  Procedure Laterality Date  . CATARACT EXTRACTION W/PHACO Right 11/12/2014   Procedure: CATARACT EXTRACTION PHACO AND INTRAOCULAR LENS PLACEMENT (IOC);  Surgeon: Galen Manila, MD;  Location: [facility] ORS;  Service: Ophthalmology;  Laterality: Right;  cassette Lot # [id number]     [place]  00:45 AP 14.7 CDE 6.62  . CATARACT EXTRACTION W/PHACO Left 12/10/2014   Procedure: CATARACT EXTRACTION PHACO AND INTRAOCULAR LENS PLACEMENT (IOC);  Surgeon: Galen Manila, MD;  Location: [facility] ORS;  Service: Ophthalmology;  Laterality: Left;  US:00:46.1 AP:20.0 CDE:9.22 LOT PACK #[id number]  . COLONOSCOPY    . COLONOSCOPY WITH PROPOFOL N/A 08/27/2021   Procedure: COLONOSCOPY WITH PROPOFOL;  Surgeon: Wyline Mood, MD;  Location: [facility] ENDOSCOPY;  Service: Gastroenterology;  Laterality: N/A;  Please leave patient as 1st case if possible. Sister to bring due to brain injury. (LG)  . ESOPHAGOGASTRODUODENOSCOPY N/A 08/27/2021   Procedure: ESOPHAGOGASTRODUODENOSCOPY (EGD);  Surgeon: Wyline Mood, MD;  [facility] END[facility]COPY;  Service: Gastroenterology;  Laterality: N/A;  . ESOPHAGOGASTRODUODENOSCOPY (EGD) WITH PROPOFOL N/A 04/23/2016   Procedure: ESOPHAGOGASTRODUODENOSCOPY (EGD) WITH PROPOFOL;  Surgeon: Wyline Mood, MD;  Location: [facility] ENDOSCOPY;  Service: Endoscopy;  Laterality: N/A;    Family History  Problem Relation Age of Onset  . Hypertension Father   . Diabetes Father   . Heart disease Father   . Hyperlipidemia  Father   . Cancer Mother        Skin  . Dementia Mother   . Diabetes Brother   . Stroke Maternal Grandmother   . Stroke Maternal Grandfather   . Heart disease Paternal Grandmother   . Heart disease Paternal Grandfather     Social History   Socioeconomic History  . Marital status: Single    Spouse name: Not on file  . Number of children: Not on file  . Years of education: Not on file  . Highest education level: Not on file  Occupational History  . Occupation:  disability   Tobacco Use  . Smoking status: Never  . Smokeless tobacco: Never  Vaping Use  . Vaping status: Never Used  Substance and Sexual Activity  . Alcohol use: No    Alcohol/week: 0.0 standard drinks of alcohol  . Drug use: No  . Sexual activity: Never  Other Topics Concern  . Not on file  Social History Narrative   Sister is legal guardian    Social Determinants of Health   Financial Resource Strain: Low Risk  (07/14/2017)   Overall Financial Resource Strain (CARDIA)   . Difficulty of Paying Living Expenses: Not hard at all  Food Insecurity: No Food Insecurity (07/14/2017)   Hunger Vital Sign   . Worried About Programme researcher, broadcasting/film/video in the Last Year: Never true   . Ran Out of Food in the Last Year: Never true  Transportation Needs: No Transportation Needs (07/14/2017)   PRAPARE - Transportation   . Lack of Transportation (Medical): No   . Lack of Transportation (Non-Medical): No  Physical Activity: Inactive (07/14/2017)   Exercise Vital Sign   . Days of Exercise per Week: 0 days   . Minutes of Exercise per Session: 0 min  Stress: No Stress Concern Present (07/14/2017)   [employer] of Occupational Health - Occupational Stress Questionnaire   . Feeling of Stress : Not at all  Social Connections: Moderately Isolated (07/14/2017)   Social Connection and Isolation Panel [NHANES]   . Frequency of Communication with Friends and Family: Never   . Frequency of Social Gatherings with Friends and Family: More than three times a week   . Attends Religious Services: Never   . Active Member of Clubs or Organizations: No   . Attends Banker Meetings: Never   . Marital Status: Never married  Intimate Partner Violence: Not At Risk (07/14/2017)   Humiliation, Afraid, Rape, and Kick questionnaire   . Fear of Current or Ex-Partner: No   . Emotionally Abused: No   . Physically Abused: No   . Sexually Abused: No    Outpatient Medications Prior to Visit  Medication Sig  Dispense Refill  . Cyanocobalamin (VITAMIN B 12 PO) Take 1 tablet by mouth daily.     Tery Sanfilippo Calcium (STOOL SOFTENER PO) Take 1 capsule by mouth daily as needed.    . empagliflozin (JARDIANCE) 10 MG TABS tablet Take 1 tablet (10 mg total) by mouth daily before breakfast. 90 tablet 1  . esomeprazole (NEXIUM) 40 MG capsule TAKE 1 CAPSULE(40 MG) BY MOUTH DAILY 90 capsule 1  . lisinopril (ZESTRIL) 10 MG tablet TAKE 1 TABLET(10 MG) BY MOUTH DAILY 90 tablet 0  . LORazepam (ATIVAN) 0.5 MG tablet TAKE 1 TABLET BY MOUTH EVERY NIGHT AT BEDTIME 30 tablet 3  . meclizine (ANTIVERT) 25 MG tablet TAKE AS NEEDED FOR DIZZINESS 90 tablet 1  . omeprazole (PRILOSEC) 40 MG capsule TAKE 1  CAPSULE(40 MG) BY MOUTH DAILY 90 capsule 3  . simvastatin (ZOCOR) 10 MG tablet TAKE 1 TABLET(10 MG) BY MOUTH DAILY 90 tablet 0  . triamcinolone cream (KENALOG) 0.1 % Apply 1 Application topically 2 (two) times daily. 30 g 0   No facility-administered medications prior to visit.    No Known Allergies  ROS Review of Systems  Constitutional: Negative.   HENT: Negative.    Eyes: Negative.   Respiratory: Negative.    Cardiovascular: Negative.   Endocrine: Negative.   Genitourinary: Negative.   Musculoskeletal: Negative.   Skin:        Toenail fungus  Neurological: Negative.   Psychiatric/Behavioral: Negative.        Objective:    Physical Exam Vitals and nursing note reviewed.  Constitutional:      Appearance: Normal appearance.  HENT:     Head: Normocephalic.  Eyes:     Conjunctiva/sclera: Conjunctivae normal.  Cardiovascular:     Rate and Rhythm: Normal rate and regular rhythm.     Pulses: Normal pulses.     Heart sounds: Normal heart sounds.  Pulmonary:     Effort: Pulmonary effort is normal.     Breath sounds: Normal breath sounds.  Musculoskeletal:     Cervical back: Normal range of motion.  Skin:    General: Skin is warm and dry.  Neurological:     Mental Status: He is alert. He is  disoriented.  Psychiatric:     Comments: Slow to respond to questions. Flat affect    There were no vitals taken for this visit. Wt Readings from Last 3 Encounters:  08/26/22 187 lb 9.6 oz (85.1 kg)  04/13/22 191 lb 8 oz (86.9 kg)  02/10/22 190 lb 1.6 oz (86.2 kg)    Lab Results  Component Value Date   WBC 4.9 08/26/2022   HGB 16.2 08/26/2022   HCT 49.9 08/26/2022   MCV 89 08/26/2022   PLT 213 08/26/2022   Lab Results  Component Value Date   NA 140 08/26/2022   K 4.5 08/26/2022   CO2 24 08/26/2022   GLUCOSE 107 (H) 08/26/2022   BUN 23 08/26/2022   CREATININE 1.08 08/26/2022   BILITOT 0.5 08/26/2022   ALKPHOS 98 08/26/2022   AST 23 08/26/2022   ALT 30 08/26/2022   PROT 7.2 08/26/2022   ALBUMIN 4.5 08/26/2022   CALCIUM 9.2 08/26/2022   EGFR 78 08/26/2022   Lab Results  Component Value Date   CHOL 134 08/26/2022   Lab Results  Component Value Date   HDL 25 (L) 08/26/2022   Lab Results  Component Value Date   LDLCALC 89 08/26/2022   Lab Results  Component Value Date   TRIG 109 08/26/2022    Lab Results  Component Value Date   HGBA1C 6.1 (H) 08/26/2022      Assessment & Plan:   Problem List Items Addressed This Visit   None    No orders of the defined types were placed in this encounter.   Follow-up: No follow-ups on file.    Larae Grooms, NP

## 2023-02-19 ENCOUNTER — Other Ambulatory Visit: Payer: Self-pay | Admitting: Nurse Practitioner

## 2023-02-21 ENCOUNTER — Other Ambulatory Visit: Payer: Self-pay | Admitting: Nurse Practitioner

## 2023-02-21 NOTE — Telephone Encounter (Signed)
Requested medication (s) are due for refill today: yes  Requested medication (s) are on the active medication list: yes  Last refill:  Meclizine: 07/16/22 #90 1 RF             Lorazepam: 10/01/22 #30 3 RF  Future visit scheduled: no  Notes to clinic:  neither meds delegated to NT to reorder   Requested Prescriptions  Pending Prescriptions Disp Refills   LORazepam (ATIVAN) 0.5 MG tablet [Pharmacy Med Name: LORAZEPAM 0.5MG  TABLETS] 30 tablet     Sig: TAKE 1 TABLET BY MOUTH EVERY NIGHT AT BEDTIME     Not Delegated - Psychiatry: Anxiolytics/Hypnotics 2 Failed - 02/19/2023 12:34 PM      Failed - This refill cannot be delegated      Failed - Urine Drug Screen completed in last 360 days      Passed - Patient is not pregnant      Passed - Valid encounter within last 6 months    Recent Outpatient Visits           5 months ago Paranoid schizophrenia (HCC)   [place] [facility] Larae Grooms, NP   10 months ago Paranoid schizophrenia [facility])   [place] [facility] Larae Grooms, NP   1 year ago Encounter for annual wellness exam in Medicare patient   [place] [facility] Larae Grooms, NP   1 year ago Primary hypertension   [facility] Larae Grooms, NP   1 year ago Primary hypertension   [place] [facility] Larae Grooms, NP               meclizine (ANTIVERT) 25 MG tablet [Pharmacy Med Name: MECLIZINE 25MG  RX TABLETS] 90 tablet 1    Sig: TAKE 1 TABLET BY MOUTH AS NEEDED FOR DIZZINESS     Not Delegated - Gastroenterology: Antiemetics Failed - 02/19/2023 12:34 PM      Failed - This refill cannot be delegated      Passed - Valid encounter within last 6 months    Recent Outpatient Visits           5 months ago Paranoid schizophrenia [facility])   [place] [facility] Larae Grooms, NP   10 months ago Paranoid schizophrenia [facility])   [place] Larae Grooms, NP   1 year ago Encounter for annual wellness exam in Medicare patient   [place] [facility] Larae Grooms, NP   1 year ago Primary hypertension   [place] [facility] Larae Grooms, NP   1 year ago Primary hypertension   [place] [facility] Larae Grooms, NP

## 2023-02-21 NOTE — Telephone Encounter (Signed)
Patient no showed follow up appointment last month. Please call to reschedule and then route to the provider for refill.

## 2023-02-22 ENCOUNTER — Ambulatory Visit: Payer: 59 | Admitting: Nurse Practitioner

## 2023-02-22 ENCOUNTER — Encounter: Payer: Self-pay | Admitting: Nurse Practitioner

## 2023-02-22 VITALS — BP 121/83 | HR 91 | Temp 97.6°F | Ht 70.5 in | Wt 188.8 lb

## 2023-02-22 DIAGNOSIS — I10 Essential (primary) hypertension: Secondary | ICD-10-CM | POA: Diagnosis not present

## 2023-02-22 DIAGNOSIS — Z23 Encounter for immunization: Secondary | ICD-10-CM

## 2023-02-22 DIAGNOSIS — E782 Mixed hyperlipidemia: Secondary | ICD-10-CM

## 2023-02-22 DIAGNOSIS — N189 Chronic kidney disease, unspecified: Secondary | ICD-10-CM

## 2023-02-22 DIAGNOSIS — E785 Hyperlipidemia, unspecified: Secondary | ICD-10-CM

## 2023-02-22 DIAGNOSIS — N181 Chronic kidney disease, stage 1: Secondary | ICD-10-CM

## 2023-02-22 DIAGNOSIS — F200 Paranoid schizophrenia: Secondary | ICD-10-CM

## 2023-02-22 DIAGNOSIS — R7301 Impaired fasting glucose: Secondary | ICD-10-CM | POA: Diagnosis not present

## 2023-02-22 DIAGNOSIS — Z9181 History of falling: Secondary | ICD-10-CM | POA: Diagnosis not present

## 2023-02-22 DIAGNOSIS — Z7189 Other specified counseling: Secondary | ICD-10-CM | POA: Diagnosis not present

## 2023-02-22 DIAGNOSIS — Z0000 Encounter for general adult medical examination without abnormal findings: Secondary | ICD-10-CM | POA: Diagnosis not present

## 2023-02-22 DIAGNOSIS — R29898 Other symptoms and signs involving the musculoskeletal system: Secondary | ICD-10-CM

## 2023-02-22 MED ORDER — LORAZEPAM 0.5 MG PO TABS: ORAL_TABLET | ORAL | 2 refills | Status: DC

## 2023-02-22 NOTE — Assessment & Plan Note (Signed)
Chronic. Controlled without medication.  Patient does take Lorazepam at bedtime to help with sleep and his mood.  Working well for him.  Discussion had with him and his sister, Darlyn Read, who is his legal guardian the risks involved taking long term Benzodiazepines. Okay with risks and would like to continue.  Patient has been taking medication for many years.  Follow up in 3 months.  Call sooner if concerns arise.

## 2023-02-22 NOTE — Assessment & Plan Note (Signed)
Chronic.  Controlled.  Continue with current medication regimen.  Labs ordered today.  Return to clinic in 6 months for reevaluation.  Call sooner if concerns arise.  ? ?

## 2023-02-22 NOTE — Telephone Encounter (Signed)
Requested Prescriptions  Pending Prescriptions Disp Refills   JARDIANCE 10 MG TABS tablet [Pharmacy Med Name: JARDIANCE 10MG  TABLETS] 90 tablet 1    Sig: TAKE 1 TABLET(10 MG) BY MOUTH DAILY BEFORE BREAKFAST     Endocrinology:  Diabetes - SGLT2 Inhibitors Passed - 02/21/2023  9:42 AM      Passed - Cr in normal range and within 360 days    Creatinine, Ser  Date Value Ref Range Status  08/26/2022 1.08 0.76 - 1.27 mg/dL Final         Passed - HBA1C is between 0 and 7.9 and within 180 days    HB A1C (BAYER DCA - WAIVED)  Date Value Ref Range Status  07/18/2020 5.7 <7.0 % Final    Comment:                                          Diabetic Adult            <7.0                                       Healthy Adult        4.3 - 5.7                                                           (DCCT/NGSP) American Diabetes Association's Summary of Glycemic Recommendations for Adults with Diabetes: Hemoglobin A1c <7.0%. More stringent glycemic goals (A1c <6.0%) may further reduce complications at the cost of increased risk of hypoglycemia.    Hgb A1c MFr Bld  Date Value Ref Range Status  08/26/2022 6.1 (H) 4.8 - 5.6 % Final    Comment:             Prediabetes: 5.7 - 6.4          Diabetes: >6.4          Glycemic control for adults with diabetes: <7.0          Passed - eGFR in normal range and within 360 days    GFR calc Af Amer  Date Value Ref Range Status  12/13/2019 104 >59 mL/min/1.73 Final    Comment:    **[facility] currently reports eGFR in compliance with the current**   recommendations of the [employer]. [facility] will   update reporting as new guidelines are published from the NKF-ASN   Task force.    GFR calc non Af Amer  Date Value Ref Range Status  12/13/2019 90 >59 mL/min/1.73 Final   eGFR  Date Value Ref Range Status  08/26/2022 78 >59 mL/min/1.73 Final         Passed - Valid encounter within last 6 months    Recent Outpatient Visits           6  months ago Paranoid schizophrenia [facility])   [place] [facility] Larae Grooms, NP   10 months ago Paranoid schizophrenia [facility])   [place] [facility] Larae Grooms, NP   1 year ago Encounter for annual wellness exam in Medicare patient   [place] [facility] Larae Grooms,  NP   1 year ago Primary hypertension   [place] [facility] Larae Grooms, NP   1 year ago Primary hypertension   [place] [facility] Larae Grooms, NP       Future Appointments             Today Larae Grooms, NP [facility]

## 2023-02-22 NOTE — Telephone Encounter (Signed)
Duplicate  Requested Prescriptions  Pending Prescriptions Disp Refills   JARDIANCE 10 MG TABS tablet [Pharmacy Med Name: JARDIANCE 10MG  TABLETS] 90 tablet 1    Sig: TAKE 1 TABLET(10 MG) BY MOUTH DAILY BEFORE BREAKFAST     Endocrinology:  Diabetes - SGLT2 Inhibitors Passed - 02/21/2023 10:00 AM      Passed - Cr in normal range and within 360 days    Creatinine, Ser  Date Value Ref Range Status  08/26/2022 1.08 0.76 - 1.27 mg/dL Final         Passed - HBA1C is between 0 and 7.9 and within 180 days    HB A1C (BAYER DCA - WAIVED)  Date Value Ref Range Status  07/18/2020 5.7 <7.0 % Final    Comment:                                          Diabetic Adult            <7.0                                       Healthy Adult        4.3 - 5.7                                                           (DCCT/NGSP) American Diabetes Association's Summary of Glycemic Recommendations for Adults with Diabetes: Hemoglobin A1c <7.0%. More stringent glycemic goals (A1c <6.0%) may further reduce complications at the cost of increased risk of hypoglycemia.    Hgb A1c MFr Bld  Date Value Ref Range Status  08/26/2022 6.1 (H) 4.8 - 5.6 % Final    Comment:             Prediabetes: 5.7 - 6.4          Diabetes: >6.4          Glycemic control for adults with diabetes: <7.0          Passed - eGFR in normal range and within 360 days    GFR calc Af Amer  Date Value Ref Range Status  12/13/2019 104 >59 mL/min/1.73 Final    Comment:    **[facility] currently reports eGFR in compliance with the current**   recommendations of the [employer]. [facility] will   update reporting as new guidelines are published from the NKF-ASN   Task force.    GFR calc non Af Amer  Date Value Ref Range Status  12/13/2019 90 >59 mL/min/1.73 Final   eGFR  Date Value Ref Range Status  08/26/2022 78 >59 mL/min/1.73 Final         Passed - Valid encounter within last 6 months    Recent Outpatient Visits            6 months ago Paranoid schizophrenia [place] [facility])   [place] [facility] Larae Grooms, NP   10 months ago Paranoid schizophrenia [facility])   [place] [facility] Larae Grooms, NP   1 year ago Encounter for annual wellness exam in Medicare patient   [place] [facility]  Clydie Braun, NP   1 year ago Primary hypertension   [place] [facility] Larae Grooms, NP   1 year ago Primary hypertension   [place] [facility] Larae Grooms, NP       Future Appointments             Today Larae Grooms, NP [place] [facility]

## 2023-02-22 NOTE — Assessment & Plan Note (Signed)
Chronic. Controlled.  Labs ordered at visit today. Will make recommendations based on lab results.

## 2023-02-22 NOTE — Assessment & Plan Note (Signed)
A voluntary discussion about advance care planning including the explanation and discussion of advance directives was extensively discussed  with the patient for 5 minutes with patient and Legal Guardian George Glenn present.  Explanation about the health care proxy and Living will was reviewed and packet with forms with explanation of how to fill them out was given.  During this discussion, the patient was able to identify a health care proxy as George Glenn and plans to fill out the paperwork required.  Patient was offered a separate Advance Care Planning visit for further assistance with forms.

## 2023-02-22 NOTE — Progress Notes (Signed)
BP 121/83 (BP Location: Left Arm, Patient Position: Sitting, Cuff Size: Large)   Pulse 91   Temp 97.6 F (36.4 C) (Oral)   Ht 5' 10.5" (1.791 m)   Wt 188 lb 12.8 oz (85.6 kg)   SpO2 92%   BMI 26.71 kg/m    Subjective:    Patient ID: George Glenn, male    DOB: 02-Nov-1960, 62 y.o.   MRN: [id number]  HPI: George Glenn is a 62 y.o. male presenting on 02/22/2023 for comprehensive medical examination. Current medical complaints include:none  He currently lives with: Interim Problems from his last visit: no  HYPERLIPIDEMIA Hyperlipidemia status: chronic Satisfied with current treatment?  yes Side effects:  no Medication compliance: excellent compliance Past cholesterol meds: simvastatin (zocor) Supplements: none Aspirin:  no The 10-year ASCVD risk score (Arnett DK, et al., 2019) is: 19.8%   Values used to calculate the score:     Age: 37 years     Sex: Male     Is Non-Hispanic African American: No     Diabetic: Yes     Tobacco smoker: No     Systolic Blood Pressure: 108 mmHg     Is BP treated: Yes     HDL Cholesterol: 25 mg/dL     Total Cholesterol: 134 mg/dL Chest pain:  no  ANEMIA Anemia status: controlled Etiology of anemia: Duration of anemia treatment:  Compliance with treatment: excellent compliance Iron supplementation side effects: no Severity of anemia: mild Fatigue: no Decreased exercise tolerance: no  Dyspnea on exertion: no Palpitations: no Bleeding: no Pica: no  INSOMNIA/MOOD Doing well with lorazepam. No side effects.  Denies concerns at visit today.    CHRONIC KIDNEY DISEASE CKD status: stable Medications renally dose: yes Previous renal evaluation: no Pneumovax:  Up to Date Influenza Vaccine:  Up to Date  Patient's caregiver is concerned about patient's lower extremity strength.  States his legs go numb when he is sitting a lot.  He has trouble walking around the block.  She is concerned with his balance and that he may fall.     Patient's caregiver states he has a nurse that comes out to the home from [place] by an [employer]. The company is now called by [employer]. He has had this company since December 2017. He gets reevaluated annually. Patient doesn't shower by himself.  She helps him clean his room, make his bed.  While patient's caregiver is out of town the aid helps make him meals.    Functional Status Survey: Is the patient deaf or have difficulty hearing?: No Does the patient have difficulty seeing, even when wearing glasses/contacts?: No Does the patient have difficulty concentrating, remembering, or making decisions?: Yes Does the patient have difficulty walking or climbing stairs?: No Does the patient have difficulty dressing or bathing?: Yes Does the patient have difficulty doing errands alone such as visiting a doctor's office or shopping?: Yes  FALL RISK:    08/26/2022    9:51 AM 04/13/2022    9:19 AM 02/10/2022    2:39 PM 11/23/2021   10:03 AM 09/23/2021   10:08 AM  Fall Risk   Falls in the past year? 0 0 0 0 0  Number falls in past yr: 0 0 0 0 0  Injury with Fall? 0 0 0 0 0  Risk for fall due to :  No Fall Risks No Fall Risks No Fall Risks No Fall Risks  Follow up Falls evaluation completed Falls evaluation completed Falls evaluation completed  Falls evaluation completed Falls evaluation completed    Depression Screen    02/22/2023    2:25 PM 08/26/2022    9:52 AM 04/13/2022    9:19 AM 02/10/2022    2:39 PM 11/23/2021   10:03 AM  Depression screen PHQ 2/9  Decreased Interest 0 0 0 0 0  Down, Depressed, Hopeless 0 0 0 0 0  PHQ - 2 Score 0 0 0 0 0  Altered sleeping 0 0 0 0 0  Tired, decreased energy 0 0 0 0 0  Change in appetite 0 0 0 0 0  Feeling bad or failure about yourself  0 0 0 0 0  Trouble concentrating 0 0 0 0 0  Moving slowly or fidgety/restless 0 0 0 0 0  Suicidal thoughts 0 0 0 0 0  PHQ-9 Score 0 0 0 0 0  Difficult doing work/chores   Not difficult at all Not difficult at all Not  difficult at all    Advanced Directives Does not have an advanced directive or living will.    Past Medical History:  Past Medical History:  Diagnosis Date   Anemia    Barrett's esophagus    Brain damage    Chronic kidney disease    Dizziness    WHEN STANDING   GERD (gastroesophageal reflux disease)    Barrett's   Hyperlipidemia    Hypertension    Hypogonadism in male    Memory loss, short term    Osteoporosis    Paranoid schizophrenia (HCC)    Pre-diabetes    Tic    HANDS    Surgical History:  Past Surgical History:  Procedure Laterality Date   CATARACT EXTRACTION W/PHACO Right 11/12/2014   Procedure: CATARACT EXTRACTION PHACO AND INTRAOCULAR LENS PLACEMENT (IOC);  Surgeon: Galen Manila, MD;  Location: [facility] ORS;  Service: Ophthalmology;  Laterality: Right;  cassette Lot # [id number]     [place]  00:45 AP 14.7 CDE 6.62   CATARACT EXTRACTION W/PHACO Left 12/10/2014   Procedure: CATARACT EXTRACTION PHACO AND INTRAOCULAR LENS PLACEMENT (IOC);  Surgeon: Galen Manila, MD;  Location: [facility] ORS;  Service: Ophthalmology;  Laterality: Left;  US:00:46.1 AP:20.0 CDE:9.22 LOT PACK #[id number]   COLONOSCOPY     COLONOSCOPY WITH PROPOFOL N/A 08/27/2021   Procedure: COLONOSCOPY WITH PROPOFOL;  Surgeon: Wyline Mood, MD;  Location: [facility] ENDOSCOPY;  Service: Gastroenterology;  Laterality: N/A;  Please leave patient as 1st case if possible. Sister to bring due to brain injury. (LG)   ESOPHAGOGASTRODUODENOSCOPY N/A 08/27/2021   Procedure: ESOPHAGOGASTRODUODENOSCOPY (EGD);  Surgeon: Wyline Mood, MD;  Location: [facility] ENDOSCOPY;  Service: Gastroenterology;  Laterality: N/A;   ESOPHAGOGASTRODUODENOSCOPY (EGD) WITH PROPOFOL N/A 04/23/2016   Procedure: ESOPHAGOGASTRODUODENOSCOPY (EGD) WITH PROPOFOL;  Surgeon: Wyline Mood, MD;  Location: [facility] ENDOSCOPY;  Service: Endoscopy;  Laterality: N/A;    Medications:  Current Outpatient Medications on File Prior to Visit  Medication Sig   Cyanocobalamin (VITAMIN  B 12 PO) Take 1 tablet by mouth daily.    Docusate Calcium (STOOL SOFTENER PO) Take 1 capsule by mouth daily as needed.   empagliflozin (JARDIANCE) 10 MG TABS tablet TAKE 1 TABLET(10 MG) BY MOUTH DAILY BEFORE BREAKFAST   esomeprazole (NEXIUM) 40 MG capsule TAKE 1 CAPSULE(40 MG) BY MOUTH DAILY   lisinopril (ZESTRIL) 10 MG tablet TAKE 1 TABLET(10 MG) BY MOUTH DAILY   meclizine (ANTIVERT) 25 MG tablet TAKE AS NEEDED FOR DIZZINESS   omeprazole (PRILOSEC) 40 MG capsule TAKE 1 CAPSULE(40 MG) BY MOUTH DAILY  simvastatin (ZOCOR) 10 MG tablet TAKE 1 TABLET(10 MG) BY MOUTH DAILY   triamcinolone cream (KENALOG) 0.1 % Apply 1 Application topically 2 (two) times daily.   No current facility-administered medications on file prior to visit.    Allergies:  No Known Allergies  Social History:  Social History   Socioeconomic History   Marital status: Single    Spouse name: Not on file   Number of children: Not on file   Years of education: Not on file   Highest education level: Not on file  Occupational History   Occupation: disability   Tobacco Use   Smoking status: Never   Smokeless tobacco: Never  Vaping Use   Vaping status: Never Used  Substance and Sexual Activity   Alcohol use: No    Alcohol/week: 0.0 standard drinks of alcohol   Drug use: No   Sexual activity: Never  Other Topics Concern   Not on file  Social History Narrative   Sister is legal guardian    Social Determinants of Health   Financial Resource Strain: Low Risk  (07/14/2017)   Overall Financial Resource Strain (CARDIA)    Difficulty of Paying Living Expenses: Not hard at all  Food Insecurity: No Food Insecurity (07/14/2017)   Hunger Vital Sign    Worried About Running Out of Food in the Last Year: Never true    Ran Out of Food in the Last Year: Never true  Transportation Needs: No Transportation Needs (07/14/2017)   PRAPARE - Administrator, Civil Service (Medical): No    Lack of Transportation  (Non-Medical): No  Physical Activity: Inactive (07/14/2017)   Exercise Vital Sign    Days of Exercise per Week: 0 days    Minutes of Exercise per Session: 0 min  Stress: No Stress Concern Present (07/14/2017)   [employer] of Occupational Health - Occupational Stress Questionnaire    Feeling of Stress : Not at all  Social Connections: Moderately Isolated (07/14/2017)   Social Connection and Isolation Panel [NHANES]    Frequency of Communication with Friends and Family: Never    Frequency of Social Gatherings with Friends and Family: More than three times a week    Attends Religious Services: Never    Database administrator or Organizations: No    Attends Banker Meetings: Never    Marital Status: Never married  Intimate Partner Violence: Not At Risk (07/14/2017)   Humiliation, Afraid, Rape, and Kick questionnaire    Fear of Current or Ex-Partner: No    Emotionally Abused: No    Physically Abused: No    Sexually Abused: No   Social History   Tobacco Use  Smoking Status Never  Smokeless Tobacco Never   Social History   Substance and Sexual Activity  Alcohol Use No   Alcohol/week: 0.0 standard drinks of alcohol    Family History:  Family History  Problem Relation Age of Onset   Hypertension Father    Diabetes Father    Heart disease Father    Hyperlipidemia Father    Cancer Mother        Skin   Dementia Mother    Diabetes Brother    Stroke Maternal Grandmother    Stroke Maternal Grandfather    Heart disease Paternal Grandmother    Heart disease Paternal Grandfather     Past medical history, surgical history, medications, allergies, family history and social history reviewed with patient today and changes made to appropriate areas of the chart.  Review of Systems  Constitutional:  Negative for malaise/fatigue.  Psychiatric/Behavioral:  Positive for depression. Negative for suicidal ideas.    All other ROS negative except what is listed above and  in the HPI.      Objective:    BP 121/83 (BP Location: Left Arm, Patient Position: Sitting, Cuff Size: Large)   Pulse 91   Temp 97.6 F (36.4 C) (Oral)   Ht 5' 10.5" (1.791 m)   Wt 188 lb 12.8 oz (85.6 kg)   SpO2 92%   BMI 26.71 kg/m   Wt Readings from Last 3 Encounters:  02/22/23 188 lb 12.8 oz (85.6 kg)  08/26/22 187 lb 9.6 oz (85.1 kg)  04/13/22 191 lb 8 oz (86.9 kg)    No results found.  Physical Exam Vitals and nursing note reviewed.  Constitutional:      General: He is not in acute distress.    Appearance: Normal appearance. He is not ill-appearing, toxic-appearing or diaphoretic.  HENT:     Head: Normocephalic.     Right Ear: Tympanic membrane, ear canal and external ear normal.     Left Ear: Tympanic membrane, ear canal and external ear normal.     Nose: Nose normal. No congestion or rhinorrhea.     Mouth/Throat:     Mouth: Mucous membranes are moist.  Eyes:     General:        Right eye: No discharge.        Left eye: No discharge.     Extraocular Movements: Extraocular movements intact.     Conjunctiva/sclera: Conjunctivae normal.     Pupils: Pupils are equal, round, and reactive to light.  Cardiovascular:     Rate and Rhythm: Normal rate and regular rhythm.     Heart sounds: No murmur heard. Pulmonary:     Effort: Pulmonary effort is normal. No respiratory distress.     Breath sounds: Normal breath sounds. No wheezing, rhonchi or rales.  Abdominal:     General: Abdomen is flat. Bowel sounds are normal. There is no distension.     Palpations: Abdomen is soft.     Tenderness: There is no abdominal tenderness. There is no guarding.  Musculoskeletal:     Cervical back: Normal range of motion and neck supple.  Skin:    General: Skin is warm and dry.     Capillary Refill: Capillary refill takes less than 2 seconds.  Neurological:     General: No focal deficit present.     Mental Status: He is alert and oriented to person, place, and time.     Cranial  Nerves: No cranial nerve deficit.     Motor: No weakness.     Deep Tendon Reflexes: Reflexes normal.  Psychiatric:        Mood and Affect: Mood normal.        Behavior: Behavior normal.        Thought Content: Thought content normal.        Judgment: Judgment normal.        02/22/2023    2:29 PM  6CIT Screen  What Year? 4 points  What month? 0 points  What time? 3 points  Count back from 20 4 points  Months in reverse 4 points  Repeat phrase 6 points  Total Score 21 points      Results for orders placed or performed in visit on 08/26/22  Comp Met (CMET)  Result Value Ref Range   Glucose 107 (H) 70 -  99 mg/dL   BUN 23 8 - 27 mg/dL   Creatinine, Ser 1.61 0.76 - 1.27 mg/dL   eGFR 78 >09 UE/AVW/0.98   BUN/Creatinine Ratio 21 10 - 24   Sodium 140 134 - 144 mmol/L   Potassium 4.5 3.5 - 5.2 mmol/L   Chloride 100 96 - 106 mmol/L   CO2 24 20 - 29 mmol/L   Calcium 9.2 8.6 - 10.2 mg/dL   Total Protein 7.2 6.0 - 8.5 g/dL   Albumin 4.5 3.9 - 4.9 g/dL   Globulin, Total 2.7 1.5 - 4.5 g/dL   Albumin/Globulin Ratio 1.7 1.2 - 2.2   Bilirubin Total 0.5 0.0 - 1.2 mg/dL   Alkaline Phosphatase 98 44 - 121 IU/L   AST 23 0 - 40 IU/L   ALT 30 0 - 44 IU/L  Lipid Profile  Result Value Ref Range   Cholesterol, Total 134 100 - 199 mg/dL   Triglycerides 119 0 - 149 mg/dL   HDL 25 (L) >14 mg/dL   VLDL Cholesterol Cal 20 5 - 40 mg/dL   LDL Chol Calc (NIH) 89 0 - 99 mg/dL   Chol/HDL Ratio 5.4 (H) 0.0 - 5.0 ratio  HgB A1c  Result Value Ref Range   Hgb A1c MFr Bld 6.1 (H) 4.8 - 5.6 %   Est. average glucose Bld gHb Est-mCnc 128 mg/dL  CBC w/Diff  Result Value Ref Range   WBC 4.9 3.4 - 10.8 x10E3/uL   RBC 5.58 4.14 - 5.80 x10E6/uL   Hemoglobin 16.2 13.0 - 17.7 g/dL   Hematocrit 78.2 95.6 - 51.0 %   MCV 89 79 - 97 fL   MCH 29.0 26.6 - 33.0 pg   MCHC 32.5 31.5 - 35.7 g/dL   RDW 21.3 08.6 - 57.8 %   Platelets 213 150 - 450 x10E3/uL   Neutrophils 51 Not Estab. %   Lymphs 38 Not Estab. %    Monocytes 8 Not Estab. %   Eos 2 Not Estab. %   Basos 1 Not Estab. %   Neutrophils Absolute 2.5 1.4 - 7.0 x10E3/uL   Lymphocytes Absolute 1.8 0.7 - 3.1 x10E3/uL   Monocytes Absolute 0.4 0.1 - 0.9 x10E3/uL   EOS (ABSOLUTE) 0.1 0.0 - 0.4 x10E3/uL   Basophils Absolute 0.0 0.0 - 0.2 x10E3/uL   Immature Granulocytes 0 Not Estab. %   Immature Grans (Abs) 0.0 0.0 - 0.1 x10E3/uL      Assessment & Plan:   Problem List Items Addressed This Visit       Cardiovascular and Mediastinum   Hypertension    Chronic. Controlled.  Continue Lisinopril 10mg  daily.  Recommend checking blood pressures at home and bringing log to next visit.  Follow up in 3 month for reevaluation.  Labs ordered today.          Endocrine   IFG (impaired fasting glucose)    Chronic.  Controlled.  Continue with current medication regimen.  Labs ordered today.  Return to clinic in 6 months for reevaluation.  Call sooner if concerns arise.        Relevant Orders   HgB A1c     Genitourinary   CKD (chronic kidney disease)    Chronic.  Controlled.  Labs ordered at visit today.  Will make recommendations based on lab results.        Relevant Orders   Comp Met (CMET)     Other   Paranoid schizophrenia (HCC)    Chronic. Controlled without medication.  Patient  does take Lorazepam at bedtime to help with sleep and his mood.  Working well for him.  Discussion had with him and his sister, Darlyn Read, who is his legal guardian the risks involved taking long term Benzodiazepines. Okay with risks and would like to continue.  Patient has been taking medication for many years.  Follow up in 3 months.  Call sooner if concerns arise.       Relevant Orders   CBC w/Diff   Hyperlipidemia    Chronic.  Controlled.  Continue with current medication regimen of Simvastatin.  Labs ordered today.  Return to clinic in 3 months for reevaluation.  Call sooner if concerns arise.        Relevant Orders   Lipid Profile   Advanced care  planning/counseling discussion    A voluntary discussion about advance care planning including the explanation and discussion of advance directives was extensively discussed  with the patient for 5 minutes with patient and Legal Guardian Darlyn Read present.  Explanation about the health care proxy and Living will was reviewed and packet with forms with explanation of how to fill them out was given.  During this discussion, the patient was able to identify a health care proxy as Darlyn Read and plans to fill out the paperwork required.  Patient was offered a separate Advance Care Planning visit for further assistance with forms.        Other Visit Diagnoses     Encounter for annual wellness exam in Medicare patient    -  Primary   Annual physical exam       Health maintenance reviewed during visit today.  Labs ordered.  Vaccines reviewed.  Colonoscopy up to date.   Relevant Orders   PSA   Need for influenza vaccination       Relevant Orders   Flu vaccine trivalent PF, 6mos and older(Flulaval,Afluria,Fluarix,Fluzone) (Completed)   Risk for falls       Decreased strength of lower extremity       Relevant Orders   Ambulatory referral to Home Health         Preventative Services:  Health Risk Assessment and Personalized Prevention Plan: Up to date Bone Mass Measurements: NA CVD Screening: Up to date Colon Cancer Screening: Up to date Depression Screening: Done today Diabetes Screening: Up to date Glaucoma Screening: Needs eye exam Hepatitis B vaccine: NA Hepatitis C screening: Up to date HIV Screening: Up to date Flu Vaccine: Given today Lung cancer Screening: NA Obesity Screening: Up to date Pneumonia Vaccines (2): Up to date STI Screening: NA PSA screening: Up to date  Discussed aspirin prophylaxis for myocardial infarction prevention and decision was it was not indicated  LABORATORY TESTING:  Health maintenance labs ordered today as discussed above.   The natural  history of prostate cancer and ongoing controversy regarding screening and potential treatment outcomes of prostate cancer has been discussed with the patient. The meaning of a false positive PSA and a false negative PSA has been discussed. He indicates understanding of the limitations of this screening test and wishes to proceed with screening PSA testing.   IMMUNIZATIONS:   - Tdap: Tetanus vaccination status reviewed: On medicare. - Influenza: Administered today - Pneumovax: Up to date - Prevnar: Not applicable - Zostavax vaccine:  Discussed at visit today  SCREENING: - Colonoscopy: Up to date  Discussed with patient purpose of the colonoscopy is to detect colon cancer at curable precancerous or early stages   - AAA Screening:  Not applicable  -Hearing Test: Not applicable  -Spirometry: Not applicable   PATIENT COUNSELING:    Sexuality: Discussed sexually transmitted diseases, partner selection, use of condoms, avoidance of unintended pregnancy  and contraceptive alternatives.   Advised to avoid cigarette smoking.  I discussed with the patient that most people either abstain from alcohol or drink within safe limits (<=14/week and <=4 drinks/occasion for males, <=7/weeks and <= 3 drinks/occasion for females) and that the risk for alcohol disorders and other health effects rises proportionally with the number of drinks per week and how often a drinker exceeds daily limits.  Discussed cessation/primary prevention of drug use and availability of treatment for abuse.   Diet: Encouraged to adjust caloric intake to maintain  or achieve ideal body weight, to reduce intake of dietary saturated fat and total fat, to limit sodium intake by avoiding high sodium foods and not adding table salt, and to maintain adequate dietary potassium and calcium preferably from fresh fruits, vegetables, and low-fat dairy products.    stressed the importance of regular exercise  Injury prevention: Discussed  safety belts, safety helmets, smoke detector, smoking near bedding or upholstery.   Dental health: Discussed importance of regular tooth brushing, flossing, and dental visits.   Follow up plan: NEXT PREVENTATIVE PHYSICAL DUE IN 1 YEAR. Return in about 3 months (around 05/25/2023) for HTN, HLD, DM2 FU.

## 2023-02-22 NOTE — Assessment & Plan Note (Signed)
Chronic.  Controlled.  Continue with current medication regimen of Simvastatin.  Labs ordered today.  Return to clinic in 3 months for reevaluation.  Call sooner if concerns arise.

## 2023-02-22 NOTE — Assessment & Plan Note (Signed)
Chronic. Controlled.  Continue Lisinopril 10mg  daily.  Recommend checking blood pressures at home and bringing log to next visit.  Follow up in 3 month for reevaluation.  Labs ordered today.

## 2023-02-22 NOTE — Telephone Encounter (Signed)
Called and scheduled appointment on today 02/22/2023 @ 2:20 pm.

## 2023-02-23 LAB — CBC WITH DIFFERENTIAL/PLATELET
Basophils Absolute: 0.1 10*3/uL (ref 0.0–0.2)
Basos: 1 %
EOS (ABSOLUTE): 0.1 10*3/uL (ref 0.0–0.4)
Eos: 3 %
Hematocrit: 49.7 % (ref 37.5–51.0)
Hemoglobin: 16.4 g/dL (ref 13.0–17.7)
Immature Grans (Abs): 0 10*3/uL (ref 0.0–0.1)
Immature Granulocytes: 0 %
Lymphocytes Absolute: 1.7 10*3/uL (ref 0.7–3.1)
Lymphs: 34 %
MCH: 29 pg (ref 26.6–33.0)
MCHC: 33 g/dL (ref 31.5–35.7)
MCV: 88 fL (ref 79–97)
Monocytes Absolute: 0.5 10*3/uL (ref 0.1–0.9)
Monocytes: 10 %
Neutrophils Absolute: 2.6 10*3/uL (ref 1.4–7.0)
Neutrophils: 52 %
Platelets: 235 10*3/uL (ref 150–450)
RBC: 5.66 x10E6/uL (ref 4.14–5.80)
RDW: 13.5 % (ref 11.6–15.4)
WBC: 4.9 10*3/uL (ref 3.4–10.8)

## 2023-02-23 LAB — PSA: Prostate Specific Ag, Serum: 0.5 ng/mL (ref 0.0–4.0)

## 2023-02-23 LAB — HEMOGLOBIN A1C
Est. average glucose Bld gHb Est-mCnc: 134 mg/dL
Hgb A1c MFr Bld: 6.3 % — ABNORMAL HIGH (ref 4.8–5.6)

## 2023-02-23 LAB — COMPREHENSIVE METABOLIC PANEL
ALT: 32 [IU]/L (ref 0–44)
AST: 25 [IU]/L (ref 0–40)
Albumin: 4.4 g/dL (ref 3.9–4.9)
Alkaline Phosphatase: 100 [IU]/L (ref 44–121)
BUN/Creatinine Ratio: 18 (ref 10–24)
BUN: 17 mg/dL (ref 8–27)
Bilirubin Total: 0.4 mg/dL (ref 0.0–1.2)
CO2: 21 mmol/L (ref 20–29)
Calcium: 8.7 mg/dL (ref 8.6–10.2)
Chloride: 103 mmol/L (ref 96–106)
Creatinine, Ser: 0.96 mg/dL (ref 0.76–1.27)
Globulin, Total: 2.7 g/dL (ref 1.5–4.5)
Glucose: 114 mg/dL — ABNORMAL HIGH (ref 70–99)
Potassium: 4.5 mmol/L (ref 3.5–5.2)
Sodium: 142 mmol/L (ref 134–144)
Total Protein: 7.1 g/dL (ref 6.0–8.5)
eGFR: 89 mL/min/{1.73_m2} (ref >59)

## 2023-02-23 LAB — LIPID PANEL
Chol/HDL Ratio: 6 ratio — ABNORMAL HIGH (ref 0.0–5.0)
Cholesterol, Total: 144 mg/dL (ref 100–199)
HDL: 24 mg/dL — ABNORMAL LOW (ref >39)
LDL Chol Calc (NIH): 82 mg/dL (ref 0–99)
Triglycerides: 227 mg/dL — ABNORMAL HIGH (ref 0–149)
VLDL Cholesterol Cal: 38 mg/dL (ref 5–40)

## 2023-02-24 ENCOUNTER — Other Ambulatory Visit: Payer: Self-pay | Admitting: Nurse Practitioner

## 2023-02-25 NOTE — Telephone Encounter (Signed)
Requested medications are due for refill today.  yes  Requested medications are on the active medications list.  yes  Last refill. 07/16/2022 #90 1 rf  Future visit scheduled.   yes  Notes to clinic.  Refill/refusal not delegated.    Requested Prescriptions  Pending Prescriptions Disp Refills   meclizine (ANTIVERT) 25 MG tablet [Pharmacy Med Name: MECLIZINE 25MG  RX TABLETS] 90 tablet 1    Sig: TAKE 1 TABLET BY MOUTH AS NEEDED FOR DIZZINESS     Not Delegated - Gastroenterology: Antiemetics Failed - 02/24/2023  3:00 PM      Failed - This refill cannot be delegated      Passed - Valid encounter within last 6 months    Recent Outpatient Visits           3 days ago Encounter for annual wellness exam in Medicare patient   [place] [facility] Larae Grooms, NP   6 months ago Paranoid schizophrenia [facility])   [place] [facility] Larae Grooms, NP   10 months ago Paranoid schizophrenia [facility])   [place] [facility] Larae Grooms, NP   1 year ago Encounter for annual wellness exam in Medicare patient   [place] [facility] Larae Grooms, NP   1 year ago Primary hypertension   [place] [facility] Larae Grooms, NP       Future Appointments             In 2 months Larae Grooms, NP [place] [facility]

## 2023-02-26 ENCOUNTER — Other Ambulatory Visit: Payer: Self-pay | Admitting: Nurse Practitioner

## 2023-02-28 ENCOUNTER — Telehealth: Payer: Self-pay | Admitting: Nurse Practitioner

## 2023-02-28 NOTE — Telephone Encounter (Signed)
FYI to provider

## 2023-02-28 NOTE — Telephone Encounter (Signed)
Requested Prescriptions  Pending Prescriptions Disp Refills   simvastatin (ZOCOR) 10 MG tablet [Pharmacy Med Name: SIMVASTATIN 10MG  TABLETS] 90 tablet 3    Sig: TAKE 1 TABLET(10 MG) BY MOUTH DAILY     Cardiovascular:  Antilipid - Statins Failed - 02/26/2023  7:02 AM      Failed - Lipid Panel in normal range within the last 12 months    Cholesterol, Total  Date Value Ref Range Status  02/22/2023 144 100 - 199 mg/dL Final   LDL Chol Calc (NIH)  Date Value Ref Range Status  02/22/2023 82 0 - 99 mg/dL Final   HDL  Date Value Ref Range Status  02/22/2023 24 (L) >39 mg/dL Final   Triglycerides  Date Value Ref Range Status  02/22/2023 227 (H) 0 - 149 mg/dL Final         Passed - Patient is not pregnant      Passed - Valid encounter within last 12 months    Recent Outpatient Visits           6 days ago Encounter for annual wellness exam in Medicare patient   [place] [facility] Larae Grooms, NP   6 months ago Paranoid schizophrenia [facility])   [place] [facility] Larae Grooms, NP   10 months ago Paranoid schizophrenia [facility])   [place] [facility] Larae Grooms, NP   1 year ago Encounter for annual wellness exam in Medicare patient   [place] [facility] Larae Grooms, NP   1 year ago Primary hypertension   [place] [facility] Larae Grooms, NP       Future Appointments             In 2 months Larae Grooms, NP [facility]

## 2023-02-28 NOTE — Telephone Encounter (Signed)
George Glenn from [facility] stated tomorrow will be the first eval visit for home care.

## 2023-03-01 DIAGNOSIS — K219 Gastro-esophageal reflux disease without esophagitis: Secondary | ICD-10-CM | POA: Diagnosis not present

## 2023-03-01 DIAGNOSIS — R7303 Prediabetes: Secondary | ICD-10-CM | POA: Diagnosis not present

## 2023-03-01 DIAGNOSIS — R531 Weakness: Secondary | ICD-10-CM | POA: Diagnosis not present

## 2023-03-01 DIAGNOSIS — G4700 Insomnia, unspecified: Secondary | ICD-10-CM | POA: Diagnosis not present

## 2023-03-01 DIAGNOSIS — Z961 Presence of intraocular lens: Secondary | ICD-10-CM | POA: Diagnosis not present

## 2023-03-01 DIAGNOSIS — Z9841 Cataract extraction status, right eye: Secondary | ICD-10-CM | POA: Diagnosis not present

## 2023-03-01 DIAGNOSIS — K2270 Barrett's esophagus without dysplasia: Secondary | ICD-10-CM | POA: Diagnosis not present

## 2023-03-01 DIAGNOSIS — Z9181 History of falling: Secondary | ICD-10-CM | POA: Diagnosis not present

## 2023-03-01 DIAGNOSIS — I129 Hypertensive chronic kidney disease with stage 1 through stage 4 chronic kidney disease, or unspecified chronic kidney disease: Secondary | ICD-10-CM | POA: Diagnosis not present

## 2023-03-01 DIAGNOSIS — Z9842 Cataract extraction status, left eye: Secondary | ICD-10-CM | POA: Diagnosis not present

## 2023-03-01 DIAGNOSIS — Z556 Problems related to health literacy: Secondary | ICD-10-CM | POA: Diagnosis not present

## 2023-03-01 DIAGNOSIS — E782 Mixed hyperlipidemia: Secondary | ICD-10-CM | POA: Diagnosis not present

## 2023-03-01 DIAGNOSIS — M810 Age-related osteoporosis without current pathological fracture: Secondary | ICD-10-CM | POA: Diagnosis not present

## 2023-03-01 DIAGNOSIS — M1990 Unspecified osteoarthritis, unspecified site: Secondary | ICD-10-CM | POA: Diagnosis not present

## 2023-03-01 DIAGNOSIS — D631 Anemia in chronic kidney disease: Secondary | ICD-10-CM | POA: Diagnosis not present

## 2023-03-01 DIAGNOSIS — Z7984 Long term (current) use of oral hypoglycemic drugs: Secondary | ICD-10-CM | POA: Diagnosis not present

## 2023-03-01 DIAGNOSIS — R29898 Other symptoms and signs involving the musculoskeletal system: Secondary | ICD-10-CM | POA: Diagnosis not present

## 2023-03-01 DIAGNOSIS — N181 Chronic kidney disease, stage 1: Secondary | ICD-10-CM | POA: Diagnosis not present

## 2023-03-07 DIAGNOSIS — Z9842 Cataract extraction status, left eye: Secondary | ICD-10-CM | POA: Diagnosis not present

## 2023-03-07 DIAGNOSIS — N181 Chronic kidney disease, stage 1: Secondary | ICD-10-CM | POA: Diagnosis not present

## 2023-03-07 DIAGNOSIS — Z961 Presence of intraocular lens: Secondary | ICD-10-CM | POA: Diagnosis not present

## 2023-03-07 DIAGNOSIS — Z9181 History of falling: Secondary | ICD-10-CM | POA: Diagnosis not present

## 2023-03-07 DIAGNOSIS — M1990 Unspecified osteoarthritis, unspecified site: Secondary | ICD-10-CM | POA: Diagnosis not present

## 2023-03-07 DIAGNOSIS — I129 Hypertensive chronic kidney disease with stage 1 through stage 4 chronic kidney disease, or unspecified chronic kidney disease: Secondary | ICD-10-CM | POA: Diagnosis not present

## 2023-03-07 DIAGNOSIS — G4700 Insomnia, unspecified: Secondary | ICD-10-CM | POA: Diagnosis not present

## 2023-03-07 DIAGNOSIS — K2270 Barrett's esophagus without dysplasia: Secondary | ICD-10-CM | POA: Diagnosis not present

## 2023-03-07 DIAGNOSIS — M810 Age-related osteoporosis without current pathological fracture: Secondary | ICD-10-CM | POA: Diagnosis not present

## 2023-03-07 DIAGNOSIS — Z556 Problems related to health literacy: Secondary | ICD-10-CM | POA: Diagnosis not present

## 2023-03-07 DIAGNOSIS — R7303 Prediabetes: Secondary | ICD-10-CM | POA: Diagnosis not present

## 2023-03-07 DIAGNOSIS — R531 Weakness: Secondary | ICD-10-CM | POA: Diagnosis not present

## 2023-03-07 DIAGNOSIS — Z9841 Cataract extraction status, right eye: Secondary | ICD-10-CM | POA: Diagnosis not present

## 2023-03-07 DIAGNOSIS — K219 Gastro-esophageal reflux disease without esophagitis: Secondary | ICD-10-CM | POA: Diagnosis not present

## 2023-03-07 DIAGNOSIS — D631 Anemia in chronic kidney disease: Secondary | ICD-10-CM | POA: Diagnosis not present

## 2023-03-07 DIAGNOSIS — R29898 Other symptoms and signs involving the musculoskeletal system: Secondary | ICD-10-CM | POA: Diagnosis not present

## 2023-03-07 DIAGNOSIS — E782 Mixed hyperlipidemia: Secondary | ICD-10-CM | POA: Diagnosis not present

## 2023-03-07 DIAGNOSIS — Z7984 Long term (current) use of oral hypoglycemic drugs: Secondary | ICD-10-CM | POA: Diagnosis not present

## 2023-03-11 DIAGNOSIS — Z961 Presence of intraocular lens: Secondary | ICD-10-CM | POA: Diagnosis not present

## 2023-03-11 DIAGNOSIS — Z9181 History of falling: Secondary | ICD-10-CM | POA: Diagnosis not present

## 2023-03-11 DIAGNOSIS — E782 Mixed hyperlipidemia: Secondary | ICD-10-CM | POA: Diagnosis not present

## 2023-03-11 DIAGNOSIS — Z7984 Long term (current) use of oral hypoglycemic drugs: Secondary | ICD-10-CM | POA: Diagnosis not present

## 2023-03-11 DIAGNOSIS — G4700 Insomnia, unspecified: Secondary | ICD-10-CM | POA: Diagnosis not present

## 2023-03-11 DIAGNOSIS — M810 Age-related osteoporosis without current pathological fracture: Secondary | ICD-10-CM | POA: Diagnosis not present

## 2023-03-11 DIAGNOSIS — Z556 Problems related to health literacy: Secondary | ICD-10-CM | POA: Diagnosis not present

## 2023-03-11 DIAGNOSIS — R29898 Other symptoms and signs involving the musculoskeletal system: Secondary | ICD-10-CM | POA: Diagnosis not present

## 2023-03-11 DIAGNOSIS — Z9841 Cataract extraction status, right eye: Secondary | ICD-10-CM | POA: Diagnosis not present

## 2023-03-11 DIAGNOSIS — I129 Hypertensive chronic kidney disease with stage 1 through stage 4 chronic kidney disease, or unspecified chronic kidney disease: Secondary | ICD-10-CM | POA: Diagnosis not present

## 2023-03-11 DIAGNOSIS — K219 Gastro-esophageal reflux disease without esophagitis: Secondary | ICD-10-CM | POA: Diagnosis not present

## 2023-03-11 DIAGNOSIS — N181 Chronic kidney disease, stage 1: Secondary | ICD-10-CM | POA: Diagnosis not present

## 2023-03-11 DIAGNOSIS — Z9842 Cataract extraction status, left eye: Secondary | ICD-10-CM | POA: Diagnosis not present

## 2023-03-11 DIAGNOSIS — M1990 Unspecified osteoarthritis, unspecified site: Secondary | ICD-10-CM | POA: Diagnosis not present

## 2023-03-11 DIAGNOSIS — R7303 Prediabetes: Secondary | ICD-10-CM | POA: Diagnosis not present

## 2023-03-11 DIAGNOSIS — R531 Weakness: Secondary | ICD-10-CM | POA: Diagnosis not present

## 2023-03-11 DIAGNOSIS — K2270 Barrett's esophagus without dysplasia: Secondary | ICD-10-CM | POA: Diagnosis not present

## 2023-03-11 DIAGNOSIS — D631 Anemia in chronic kidney disease: Secondary | ICD-10-CM | POA: Diagnosis not present

## 2023-03-14 DIAGNOSIS — K219 Gastro-esophageal reflux disease without esophagitis: Secondary | ICD-10-CM | POA: Diagnosis not present

## 2023-03-14 DIAGNOSIS — M1990 Unspecified osteoarthritis, unspecified site: Secondary | ICD-10-CM | POA: Diagnosis not present

## 2023-03-14 DIAGNOSIS — Z9842 Cataract extraction status, left eye: Secondary | ICD-10-CM | POA: Diagnosis not present

## 2023-03-14 DIAGNOSIS — I129 Hypertensive chronic kidney disease with stage 1 through stage 4 chronic kidney disease, or unspecified chronic kidney disease: Secondary | ICD-10-CM | POA: Diagnosis not present

## 2023-03-14 DIAGNOSIS — R29898 Other symptoms and signs involving the musculoskeletal system: Secondary | ICD-10-CM | POA: Diagnosis not present

## 2023-03-14 DIAGNOSIS — K2270 Barrett's esophagus without dysplasia: Secondary | ICD-10-CM | POA: Diagnosis not present

## 2023-03-14 DIAGNOSIS — M810 Age-related osteoporosis without current pathological fracture: Secondary | ICD-10-CM | POA: Diagnosis not present

## 2023-03-14 DIAGNOSIS — Z961 Presence of intraocular lens: Secondary | ICD-10-CM | POA: Diagnosis not present

## 2023-03-14 DIAGNOSIS — Z9841 Cataract extraction status, right eye: Secondary | ICD-10-CM | POA: Diagnosis not present

## 2023-03-14 DIAGNOSIS — R531 Weakness: Secondary | ICD-10-CM | POA: Diagnosis not present

## 2023-03-14 DIAGNOSIS — G4700 Insomnia, unspecified: Secondary | ICD-10-CM | POA: Diagnosis not present

## 2023-03-14 DIAGNOSIS — R7303 Prediabetes: Secondary | ICD-10-CM | POA: Diagnosis not present

## 2023-03-14 DIAGNOSIS — Z7984 Long term (current) use of oral hypoglycemic drugs: Secondary | ICD-10-CM | POA: Diagnosis not present

## 2023-03-14 DIAGNOSIS — E782 Mixed hyperlipidemia: Secondary | ICD-10-CM | POA: Diagnosis not present

## 2023-03-14 DIAGNOSIS — Z9181 History of falling: Secondary | ICD-10-CM | POA: Diagnosis not present

## 2023-03-14 DIAGNOSIS — Z556 Problems related to health literacy: Secondary | ICD-10-CM | POA: Diagnosis not present

## 2023-03-14 DIAGNOSIS — N181 Chronic kidney disease, stage 1: Secondary | ICD-10-CM | POA: Diagnosis not present

## 2023-03-14 DIAGNOSIS — D631 Anemia in chronic kidney disease: Secondary | ICD-10-CM | POA: Diagnosis not present

## 2023-03-16 DIAGNOSIS — M810 Age-related osteoporosis without current pathological fracture: Secondary | ICD-10-CM | POA: Diagnosis not present

## 2023-03-16 DIAGNOSIS — N181 Chronic kidney disease, stage 1: Secondary | ICD-10-CM | POA: Diagnosis not present

## 2023-03-16 DIAGNOSIS — Z556 Problems related to health literacy: Secondary | ICD-10-CM | POA: Diagnosis not present

## 2023-03-16 DIAGNOSIS — R531 Weakness: Secondary | ICD-10-CM | POA: Diagnosis not present

## 2023-03-16 DIAGNOSIS — R29898 Other symptoms and signs involving the musculoskeletal system: Secondary | ICD-10-CM | POA: Diagnosis not present

## 2023-03-16 DIAGNOSIS — K219 Gastro-esophageal reflux disease without esophagitis: Secondary | ICD-10-CM | POA: Diagnosis not present

## 2023-03-16 DIAGNOSIS — I129 Hypertensive chronic kidney disease with stage 1 through stage 4 chronic kidney disease, or unspecified chronic kidney disease: Secondary | ICD-10-CM | POA: Diagnosis not present

## 2023-03-16 DIAGNOSIS — Z961 Presence of intraocular lens: Secondary | ICD-10-CM | POA: Diagnosis not present

## 2023-03-16 DIAGNOSIS — M1990 Unspecified osteoarthritis, unspecified site: Secondary | ICD-10-CM | POA: Diagnosis not present

## 2023-03-16 DIAGNOSIS — Z7984 Long term (current) use of oral hypoglycemic drugs: Secondary | ICD-10-CM | POA: Diagnosis not present

## 2023-03-16 DIAGNOSIS — R7303 Prediabetes: Secondary | ICD-10-CM | POA: Diagnosis not present

## 2023-03-16 DIAGNOSIS — K2270 Barrett's esophagus without dysplasia: Secondary | ICD-10-CM | POA: Diagnosis not present

## 2023-03-16 DIAGNOSIS — Z9181 History of falling: Secondary | ICD-10-CM | POA: Diagnosis not present

## 2023-03-16 DIAGNOSIS — E782 Mixed hyperlipidemia: Secondary | ICD-10-CM | POA: Diagnosis not present

## 2023-03-16 DIAGNOSIS — Z9842 Cataract extraction status, left eye: Secondary | ICD-10-CM | POA: Diagnosis not present

## 2023-03-16 DIAGNOSIS — D631 Anemia in chronic kidney disease: Secondary | ICD-10-CM | POA: Diagnosis not present

## 2023-03-16 DIAGNOSIS — Z9841 Cataract extraction status, right eye: Secondary | ICD-10-CM | POA: Diagnosis not present

## 2023-03-16 DIAGNOSIS — G4700 Insomnia, unspecified: Secondary | ICD-10-CM | POA: Diagnosis not present

## 2023-03-23 ENCOUNTER — Other Ambulatory Visit: Payer: Self-pay | Admitting: Nurse Practitioner

## 2023-03-24 DIAGNOSIS — G4700 Insomnia, unspecified: Secondary | ICD-10-CM | POA: Diagnosis not present

## 2023-03-24 DIAGNOSIS — R7303 Prediabetes: Secondary | ICD-10-CM | POA: Diagnosis not present

## 2023-03-24 DIAGNOSIS — R531 Weakness: Secondary | ICD-10-CM | POA: Diagnosis not present

## 2023-03-24 DIAGNOSIS — E782 Mixed hyperlipidemia: Secondary | ICD-10-CM | POA: Diagnosis not present

## 2023-03-24 DIAGNOSIS — M1990 Unspecified osteoarthritis, unspecified site: Secondary | ICD-10-CM | POA: Diagnosis not present

## 2023-03-24 DIAGNOSIS — Z961 Presence of intraocular lens: Secondary | ICD-10-CM | POA: Diagnosis not present

## 2023-03-24 DIAGNOSIS — Z9181 History of falling: Secondary | ICD-10-CM | POA: Diagnosis not present

## 2023-03-24 DIAGNOSIS — K219 Gastro-esophageal reflux disease without esophagitis: Secondary | ICD-10-CM | POA: Diagnosis not present

## 2023-03-24 DIAGNOSIS — I129 Hypertensive chronic kidney disease with stage 1 through stage 4 chronic kidney disease, or unspecified chronic kidney disease: Secondary | ICD-10-CM | POA: Diagnosis not present

## 2023-03-24 DIAGNOSIS — Z556 Problems related to health literacy: Secondary | ICD-10-CM | POA: Diagnosis not present

## 2023-03-24 DIAGNOSIS — R29898 Other symptoms and signs involving the musculoskeletal system: Secondary | ICD-10-CM | POA: Diagnosis not present

## 2023-03-24 DIAGNOSIS — Z7984 Long term (current) use of oral hypoglycemic drugs: Secondary | ICD-10-CM | POA: Diagnosis not present

## 2023-03-24 DIAGNOSIS — D631 Anemia in chronic kidney disease: Secondary | ICD-10-CM | POA: Diagnosis not present

## 2023-03-24 DIAGNOSIS — Z9842 Cataract extraction status, left eye: Secondary | ICD-10-CM | POA: Diagnosis not present

## 2023-03-24 DIAGNOSIS — M810 Age-related osteoporosis without current pathological fracture: Secondary | ICD-10-CM | POA: Diagnosis not present

## 2023-03-24 DIAGNOSIS — K2270 Barrett's esophagus without dysplasia: Secondary | ICD-10-CM | POA: Diagnosis not present

## 2023-03-24 DIAGNOSIS — Z9841 Cataract extraction status, right eye: Secondary | ICD-10-CM | POA: Diagnosis not present

## 2023-03-24 DIAGNOSIS — N181 Chronic kidney disease, stage 1: Secondary | ICD-10-CM | POA: Diagnosis not present

## 2023-03-25 NOTE — Telephone Encounter (Signed)
Requested Prescriptions  Pending Prescriptions Disp Refills   lisinopril (ZESTRIL) 10 MG tablet [Pharmacy Med Name: LISINOPRIL 10MG  TABLETS] 90 tablet 0    Sig: TAKE 1 TABLET(10 MG) BY MOUTH DAILY     Cardiovascular:  ACE Inhibitors Passed - 03/23/2023 11:24 AM      Passed - Cr in normal range and within 180 days    Creatinine, Ser  Date Value Ref Range Status  02/22/2023 0.96 0.76 - 1.27 mg/dL Final         Passed - K in normal range and within 180 days    Potassium  Date Value Ref Range Status  02/22/2023 4.5 3.5 - 5.2 mmol/L Final         Passed - Patient is not pregnant      Passed - Last BP in normal range    BP Readings from Last 1 Encounters:  02/22/23 121/83         Passed - Valid encounter within last 6 months    Recent Outpatient Visits           1 month ago Encounter for annual wellness exam in Medicare patient   [facility] Larae Grooms, NP   7 months ago Paranoid schizophrenia [facility])   [place] [facility] Larae Grooms, NP   11 months ago Paranoid schizophrenia [facility])   [facility] Larae Grooms, NP   1 year ago Encounter for annual wellness exam in Medicare patient   [facility] Larae Grooms, NP   1 year ago Primary hypertension   [place] Larae Grooms, NP       Future Appointments             In 2 months Larae Grooms, NP [place] [facility]

## 2023-03-28 DIAGNOSIS — M6281 Muscle weakness (generalized): Secondary | ICD-10-CM | POA: Diagnosis not present

## 2023-03-28 DIAGNOSIS — I129 Hypertensive chronic kidney disease with stage 1 through stage 4 chronic kidney disease, or unspecified chronic kidney disease: Secondary | ICD-10-CM | POA: Diagnosis not present

## 2023-03-28 DIAGNOSIS — N181 Chronic kidney disease, stage 1: Secondary | ICD-10-CM | POA: Diagnosis not present

## 2023-03-28 DIAGNOSIS — R262 Difficulty in walking, not elsewhere classified: Secondary | ICD-10-CM | POA: Diagnosis not present

## 2023-03-28 DIAGNOSIS — D631 Anemia in chronic kidney disease: Secondary | ICD-10-CM | POA: Diagnosis not present

## 2023-03-29 DIAGNOSIS — M810 Age-related osteoporosis without current pathological fracture: Secondary | ICD-10-CM | POA: Diagnosis not present

## 2023-03-29 DIAGNOSIS — G4700 Insomnia, unspecified: Secondary | ICD-10-CM | POA: Diagnosis not present

## 2023-03-29 DIAGNOSIS — K219 Gastro-esophageal reflux disease without esophagitis: Secondary | ICD-10-CM | POA: Diagnosis not present

## 2023-03-29 DIAGNOSIS — M1990 Unspecified osteoarthritis, unspecified site: Secondary | ICD-10-CM | POA: Diagnosis not present

## 2023-03-29 DIAGNOSIS — K2270 Barrett's esophagus without dysplasia: Secondary | ICD-10-CM | POA: Diagnosis not present

## 2023-03-29 DIAGNOSIS — E782 Mixed hyperlipidemia: Secondary | ICD-10-CM | POA: Diagnosis not present

## 2023-03-29 DIAGNOSIS — I129 Hypertensive chronic kidney disease with stage 1 through stage 4 chronic kidney disease, or unspecified chronic kidney disease: Secondary | ICD-10-CM | POA: Diagnosis not present

## 2023-03-29 DIAGNOSIS — Z7984 Long term (current) use of oral hypoglycemic drugs: Secondary | ICD-10-CM | POA: Diagnosis not present

## 2023-03-29 DIAGNOSIS — N181 Chronic kidney disease, stage 1: Secondary | ICD-10-CM | POA: Diagnosis not present

## 2023-03-29 DIAGNOSIS — Z9181 History of falling: Secondary | ICD-10-CM | POA: Diagnosis not present

## 2023-03-29 DIAGNOSIS — Z9841 Cataract extraction status, right eye: Secondary | ICD-10-CM | POA: Diagnosis not present

## 2023-03-29 DIAGNOSIS — R7303 Prediabetes: Secondary | ICD-10-CM | POA: Diagnosis not present

## 2023-03-29 DIAGNOSIS — D631 Anemia in chronic kidney disease: Secondary | ICD-10-CM | POA: Diagnosis not present

## 2023-03-29 DIAGNOSIS — Z961 Presence of intraocular lens: Secondary | ICD-10-CM | POA: Diagnosis not present

## 2023-03-29 DIAGNOSIS — R531 Weakness: Secondary | ICD-10-CM | POA: Diagnosis not present

## 2023-03-29 DIAGNOSIS — R29898 Other symptoms and signs involving the musculoskeletal system: Secondary | ICD-10-CM | POA: Diagnosis not present

## 2023-03-29 DIAGNOSIS — Z9842 Cataract extraction status, left eye: Secondary | ICD-10-CM | POA: Diagnosis not present

## 2023-03-29 DIAGNOSIS — Z556 Problems related to health literacy: Secondary | ICD-10-CM | POA: Diagnosis not present

## 2023-04-05 ENCOUNTER — Ambulatory Visit (INDEPENDENT_AMBULATORY_CARE_PROVIDER_SITE_OTHER): Payer: 59 | Admitting: Nurse Practitioner

## 2023-04-05 ENCOUNTER — Ambulatory Visit: Payer: Self-pay | Admitting: *Deleted

## 2023-04-05 ENCOUNTER — Encounter: Payer: Self-pay | Admitting: Nurse Practitioner

## 2023-04-05 VITALS — BP 103/69 | HR 77 | Temp 98.2°F | Resp 16 | Wt 187.8 lb

## 2023-04-05 DIAGNOSIS — H539 Unspecified visual disturbance: Secondary | ICD-10-CM | POA: Diagnosis not present

## 2023-04-05 DIAGNOSIS — J069 Acute upper respiratory infection, unspecified: Secondary | ICD-10-CM | POA: Insufficient documentation

## 2023-04-05 DIAGNOSIS — K5901 Slow transit constipation: Secondary | ICD-10-CM | POA: Diagnosis not present

## 2023-04-05 NOTE — Patient Instructions (Addendum)
-   Metamucil gummies daily. - Coricidin over the counter - Diabetic Tussin  Constipation, Adult Constipation is when a person has trouble pooping (having a bowel movement). When you have this condition, you may poop fewer than 3 times a week. Your poop (stool) may also be dry, hard, or bigger than normal. Follow these instructions at home: Eating and drinking  Eat foods that have a lot of fiber, such as: Fresh fruits and vegetables. Whole grains. Beans. Eat less of foods that are low in fiber and high in fat and sugar, such as: [place] fries. Hamburgers. Cookies. Candy. Soda. Drink enough fluid to keep your pee (urine) pale yellow. General instructions Exercise regularly or as told by your doctor. Try to do 150 minutes of exercise each week. Go to the restroom when you feel like you need to poop. Do not hold it in. Take over-the-counter and prescription medicines only as told by your doctor. These include any fiber supplements. When you poop: Do deep breathing while relaxing your lower belly (abdomen). Relax your pelvic floor. The pelvic floor is a group of muscles that support the rectum, bladder, and intestines (as well as the uterus in women). Watch your condition for any changes. Tell your doctor if you notice any. Keep all follow-up visits as told by your doctor. This is important. Contact a doctor if: You have pain that gets worse. You have a fever. You have not pooped for 4 days. You vomit. You are not hungry. You lose weight. You are bleeding from the opening of the butt (anus). You have thin, pencil-like poop. Get help right away if: You have a fever, and your symptoms suddenly get worse. You leak poop or have blood in your poop. Your belly feels hard or bigger than normal (bloated). You have very bad belly pain. You feel dizzy or you faint. Summary Constipation is when a person poops fewer than 3 times a week, has trouble pooping, or has poop that is dry, hard, or  bigger than normal. Eat foods that have a lot of fiber. Drink enough fluid to keep your pee (urine) pale yellow. Take over-the-counter and prescription medicines only as told by your doctor. These include any fiber supplements. This information is not intended to replace advice given to you by your health care provider. Make sure you discuss any questions you have with your health care provider. Document Revised: 02/17/2022 Document Reviewed: 02/17/2022 Elsevier Patient Education  2024 [employer].

## 2023-04-05 NOTE — Progress Notes (Signed)
BP 103/69 (BP Location: Left Arm, Patient Position: Sitting, Cuff Size: Normal)   Pulse 77   Temp 98.2 F (36.8 C) (Oral)   Resp 16   Wt 187 lb 12.8 oz (85.2 kg)   SpO2 93%   BMI 26.57 kg/m    Subjective:    Patient ID: George Glenn, male    DOB: 1960/12/07, 62 y.o.   MRN: [id number]  HPI: George Glenn is a 62 y.o. male  Chief Complaint  Patient presents with   Nasal Congestion    Blowing his nose a lot, head congestion, sneezing, coughing some, choking some while eating    Constipation    Stomach ache, not eating all weekend long. Went to bathroom day before yesterday   Sister is requesting eye exam at [facility] for patient.  Sister is at bedside to assist with HPI.  UPPER RESPIRATORY TRACT INFECTION Started on the weekend with symptoms. Fever: no Cough: yes Shortness of breath: no Wheezing: no Chest pain: no Chest tightness: no Chest congestion: no Nasal congestion: yes Runny nose: yes Post nasal drip: yes Sneezing: no Sore throat: no Swollen glands: no Sinus pressure: yes Headache: yes Face pain: no Toothache: no Ear pain: none Ear pressure: none Eyes red/itching:no Eye drainage/crusting: no  Vomiting:  nausea only Rash: no Fatigue: yes Sick contacts: yes Strep contacts: no  Context: stable Recurrent sinusitis: no Relief with OTC cold/cough medications: no  Treatments attempted: cold/sinus    CONSTIPATION Started on Friday, that is when he told sister.  He reports last BM yesterday, was difficult to get out.  Appetite has been reduced. Duration:days Status: stable Treatments attempted: Senna Fever: no Nausea: yes Vomiting: no Weight loss: no Decreased appetite: yes Diarrhea: no Constipation: yes Blood in stool: no Heartburn: no Jaundice: no Rash: no Dysuria/urinary frequency: no Hematuria: no History of sexually transmitted disease: no Recurrent NSAID use: no   Relevant past medical, surgical, family and social history  reviewed and updated as indicated. Interim medical history since our last visit reviewed. Allergies and medications reviewed and updated.  Review of Systems  Constitutional:  Positive for fatigue. Negative for activity change, appetite change, chills, diaphoresis and fever.  HENT:  Positive for congestion, postnasal drip, rhinorrhea and sinus pressure. Negative for ear discharge, ear pain, sinus pain, sneezing, sore throat and voice change.   Respiratory:  Positive for cough. Negative for chest tightness, shortness of breath and wheezing.   Cardiovascular:  Negative for chest pain, palpitations and leg swelling.  Gastrointestinal:  Positive for constipation.  Neurological:  Positive for headaches.  Psychiatric/Behavioral: Negative.      Per HPI unless specifically indicated above     Objective:    BP 103/69 (BP Location: Left Arm, Patient Position: Sitting, Cuff Size: Normal)   Pulse 77   Temp 98.2 F (36.8 C) (Oral)   Resp 16   Wt 187 lb 12.8 oz (85.2 kg)   SpO2 93%   BMI 26.57 kg/m   Wt Readings from Last 3 Encounters:  04/05/23 187 lb 12.8 oz (85.2 kg)  02/22/23 188 lb 12.8 oz (85.6 kg)  08/26/22 187 lb 9.6 oz (85.1 kg)    Physical Exam Vitals and nursing note reviewed.  Constitutional:      General: He is awake. He is not in acute distress.    Appearance: Normal appearance. He is well-developed and well-groomed. He is not ill-appearing or toxic-appearing.  HENT:     Head: Normocephalic.     Right Ear: Hearing, ear  canal and external ear normal. No tenderness. A middle ear effusion is present. Tympanic membrane is not injected or perforated.     Left Ear: Hearing, ear canal and external ear normal. No tenderness. A middle ear effusion is present. Tympanic membrane is not injected or perforated.     Nose: Rhinorrhea present. Rhinorrhea is clear.     Right Sinus: No maxillary sinus tenderness or frontal sinus tenderness.     Left Sinus: No maxillary sinus tenderness or  frontal sinus tenderness.     Mouth/Throat:     Mouth: Mucous membranes are moist.     Pharynx: No pharyngeal swelling, oropharyngeal exudate or posterior oropharyngeal erythema.  Eyes:     General: Lids are normal.     Extraocular Movements: Extraocular movements intact.     Conjunctiva/sclera: Conjunctivae normal.  Neck:     Thyroid: No thyromegaly.     Vascular: No carotid bruit.  Cardiovascular:     Rate and Rhythm: Normal rate and regular rhythm.     Heart sounds: Normal heart sounds.  Pulmonary:     Effort: Pulmonary effort is normal. No accessory muscle usage or respiratory distress.     Breath sounds: Normal breath sounds. No decreased breath sounds, wheezing or rhonchi.  Abdominal:     General: Bowel sounds are normal. There is no distension.     Palpations: Abdomen is soft. There is no hepatomegaly.     Tenderness: There is no abdominal tenderness.  Musculoskeletal:     Cervical back: Full passive range of motion without pain.     Right lower leg: No edema.     Left lower leg: No edema.  Lymphadenopathy:     Cervical: No cervical adenopathy.  Skin:    General: Skin is warm.     Capillary Refill: Capillary refill takes less than 2 seconds.  Neurological:     Mental Status: He is alert and oriented to person, place, and time.     Deep Tendon Reflexes: Reflexes are normal and symmetric.     Reflex Scores:      Brachioradialis reflexes are 2+ on the right side and 2+ on the left side.      Patellar reflexes are 2+ on the right side and 2+ on the left side. Psychiatric:        Attention and Perception: Attention normal.        Mood and Affect: Mood normal.        Speech: Speech normal.        Behavior: Behavior normal. Behavior is cooperative.        Thought Content: Thought content normal.     Results for orders placed or performed in visit on 02/22/23  Comp Met (CMET)   Collection Time: 02/22/23  2:47 PM  Result Value Ref Range   Glucose 114 (H) 70 - 99 mg/dL    BUN 17 8 - 27 mg/dL   Creatinine, Ser 1.02 0.76 - 1.27 mg/dL   eGFR 89 >72 ZD/GUY/4.03   BUN/Creatinine Ratio 18 10 - 24   Sodium 142 134 - 144 mmol/L   Potassium 4.5 3.5 - 5.2 mmol/L   Chloride 103 96 - 106 mmol/L   CO2 21 20 - 29 mmol/L   Calcium 8.7 8.6 - 10.2 mg/dL   Total Protein 7.1 6.0 - 8.5 g/dL   Albumin 4.4 3.9 - 4.9 g/dL   Globulin, Total 2.7 1.5 - 4.5 g/dL   Bilirubin Total 0.4 0.0 - 1.2 mg/dL  Alkaline Phosphatase 100 44 - 121 IU/L   AST 25 0 - 40 IU/L   ALT 32 0 - 44 IU/L  CBC w/Diff   Collection Time: 02/22/23  2:47 PM  Result Value Ref Range   WBC 4.9 3.4 - 10.8 x10E3/uL   RBC 5.66 4.14 - 5.80 x10E6/uL   Hemoglobin 16.4 13.0 - 17.7 g/dL   Hematocrit 21.3 08.6 - 51.0 %   MCV 88 79 - 97 fL   MCH 29.0 26.6 - 33.0 pg   MCHC 33.0 31.5 - 35.7 g/dL   RDW 57.8 46.9 - 62.9 %   Platelets 235 150 - 450 x10E3/uL   Neutrophils 52 Not Estab. %   Lymphs 34 Not Estab. %   Monocytes 10 Not Estab. %   Eos 3 Not Estab. %   Basos 1 Not Estab. %   Neutrophils Absolute 2.6 1.4 - 7.0 x10E3/uL   Lymphocytes Absolute 1.7 0.7 - 3.1 x10E3/uL   Monocytes Absolute 0.5 0.1 - 0.9 x10E3/uL   EOS (ABSOLUTE) 0.1 0.0 - 0.4 x10E3/uL   Basophils Absolute 0.1 0.0 - 0.2 x10E3/uL   Immature Granulocytes 0 Not Estab. %   Immature Grans (Abs) 0.0 0.0 - 0.1 x10E3/uL  HgB A1c   Collection Time: 02/22/23  2:47 PM  Result Value Ref Range   Hgb A1c MFr Bld 6.3 (H) 4.8 - 5.6 %   Est. average glucose Bld gHb Est-mCnc 134 mg/dL  Lipid Profile   Collection Time: 02/22/23  2:47 PM  Result Value Ref Range   Cholesterol, Total 144 100 - 199 mg/dL   Triglycerides 528 (H) 0 - 149 mg/dL   HDL 24 (L) >41 mg/dL   VLDL Cholesterol Cal 38 5 - 40 mg/dL   LDL Chol Calc (NIH) 82 0 - 99 mg/dL   Chol/HDL Ratio 6.0 (H) 0.0 - 5.0 ratio  PSA   Collection Time: 02/22/23  2:47 PM  Result Value Ref Range   Prostate Specific Ag, Serum 0.5 0.0 - 4.0 ng/mL      Assessment & Plan:   Problem List Items  Addressed This Visit       Respiratory   Upper respiratory tract infection - Primary   Acute for 3-4 days.  Overall exam reassuring today.  Symptoms are more consistent with a viral upper respiratory infection. Discussed with them that these often run their course in 5-7 days. Antibiotics don't work against viruses and increase risk of side effects. If continues to feel worse or poorly by end of week and alert provider, will send in abx at that time. Recommend: - Increased rest - Increasing Fluids - Acetaminophen as needed for fever/pain.  - Salt water gargling, chloraseptic spray and throat lozenges - OTC Coricidin or Diabetic Tussin - Mucinex.  - Humidifying the air.         Digestive   Slow transit constipation   Occasional issues with this.  Recommend they start daily Metamucil gummies to help increase fiber intake + focus on diet changes for this.  Continue good fluid intake at home.  No red flags on exam.      Other Visit Diagnoses       Vision changes       Referral to [facility] placed.   Relevant Orders   Ambulatory referral to Ophthalmology        Follow up plan: Return if symptoms worsen or fail to improve.

## 2023-04-05 NOTE — Assessment & Plan Note (Signed)
Occasional issues with this.  Recommend they start daily Metamucil gummies to help increase fiber intake + focus on diet changes for this.  Continue good fluid intake at home.  No red flags on exam.

## 2023-04-05 NOTE — Assessment & Plan Note (Signed)
Acute for 3-4 days.  Overall exam reassuring today.  Symptoms are more consistent with a viral upper respiratory infection. Discussed with them that these often run their course in 5-7 days. Antibiotics don't work against viruses and increase risk of side effects. If continues to feel worse or poorly by end of week and alert provider, will send in abx at that time. Recommend: - Increased rest - Increasing Fluids - Acetaminophen as needed for fever/pain.  - Salt water gargling, chloraseptic spray and throat lozenges - OTC Coricidin or Diabetic Tussin - Mucinex.  - Humidifying the air.

## 2023-04-05 NOTE — Telephone Encounter (Signed)
  Chief Complaint: Cough, runny nose Symptoms: Dry cough, frontal headache,runny nose, sneezing. Also reports lethargy, poor appetite, mild upper abdominal pain, resolved somewhat after laxative and BM 2 days ago. Frequency: Friday Pertinent Negatives: Patient denies SOB. Unsure if febrile Disposition: [] ED /[] Urgent Care (no appt availability in office) / [x] Appointment(In office/virtual)/ []  [facility]/ [] Home Care/ [] Refused Recommended Disposition /[] [facility]/ []  Follow-up with PCP Additional Notes: Spoke with pt's sister  [place], [place]. Pt present. Appt secured for today, care advise provided, pt's sister verbalizes understanding.  Reason for Disposition  Earache is present  Answer Assessment - Initial Assessment Questions 1. ONSET: "When did the cough begin?"     Friday 2. SEVERITY: "How bad is the cough today?"      Mostly runny nose 3. SPUTUM: "Describe the color of your sputum" (none, dry cough; clear, white, yellow, green)     unsure 4. HEMOPTYSIS: "Are you coughing up any blood?" If so ask: "How much?" (flecks, streaks, tablespoons, etc.)     no 5. DIFFICULTY BREATHING: "Are you having difficulty breathing?" If Yes, ask: "How bad is it?" (e.g., mild, moderate, severe)    - MILD: No SOB at rest, mild SOB with walking, speaks normally in sentences, can lie down, no retractions, pulse < 100.    - MODERATE: SOB at rest, SOB with minimal exertion and prefers to sit, cannot lie down flat, speaks in phrases, mild retractions, audible wheezing, pulse 100-120.    - SEVERE: Very SOB at rest, speaks in single words, struggling to breathe, sitting hunched forward, retractions, pulse > 120      no 6. FEVER: "Do you have a fever?" If Yes, ask: "What is your temperature, how was it measured, and when did it start?"     unsure  10. OTHER SYMPTOMS: "Do you have any other symptoms?" (e.g., runny nose, wheezing, chest pain)       Runny nose, lethargy, upper abdominal  pain, mild after BM, Frontal headache, poor appetite  Protocols used: Cough - Acute Non-Productive-A-AH

## 2023-04-07 ENCOUNTER — Telehealth: Payer: Self-pay | Admitting: Nurse Practitioner

## 2023-04-07 DIAGNOSIS — M1990 Unspecified osteoarthritis, unspecified site: Secondary | ICD-10-CM | POA: Diagnosis not present

## 2023-04-07 DIAGNOSIS — M810 Age-related osteoporosis without current pathological fracture: Secondary | ICD-10-CM | POA: Diagnosis not present

## 2023-04-07 DIAGNOSIS — Z961 Presence of intraocular lens: Secondary | ICD-10-CM | POA: Diagnosis not present

## 2023-04-07 DIAGNOSIS — E782 Mixed hyperlipidemia: Secondary | ICD-10-CM | POA: Diagnosis not present

## 2023-04-07 DIAGNOSIS — Z9842 Cataract extraction status, left eye: Secondary | ICD-10-CM | POA: Diagnosis not present

## 2023-04-07 DIAGNOSIS — R7303 Prediabetes: Secondary | ICD-10-CM | POA: Diagnosis not present

## 2023-04-07 DIAGNOSIS — G4700 Insomnia, unspecified: Secondary | ICD-10-CM | POA: Diagnosis not present

## 2023-04-07 DIAGNOSIS — Z556 Problems related to health literacy: Secondary | ICD-10-CM | POA: Diagnosis not present

## 2023-04-07 DIAGNOSIS — I129 Hypertensive chronic kidney disease with stage 1 through stage 4 chronic kidney disease, or unspecified chronic kidney disease: Secondary | ICD-10-CM | POA: Diagnosis not present

## 2023-04-07 DIAGNOSIS — K219 Gastro-esophageal reflux disease without esophagitis: Secondary | ICD-10-CM | POA: Diagnosis not present

## 2023-04-07 DIAGNOSIS — R531 Weakness: Secondary | ICD-10-CM | POA: Diagnosis not present

## 2023-04-07 DIAGNOSIS — K2270 Barrett's esophagus without dysplasia: Secondary | ICD-10-CM | POA: Diagnosis not present

## 2023-04-07 DIAGNOSIS — Z7984 Long term (current) use of oral hypoglycemic drugs: Secondary | ICD-10-CM | POA: Diagnosis not present

## 2023-04-07 DIAGNOSIS — R29898 Other symptoms and signs involving the musculoskeletal system: Secondary | ICD-10-CM | POA: Diagnosis not present

## 2023-04-07 DIAGNOSIS — N181 Chronic kidney disease, stage 1: Secondary | ICD-10-CM | POA: Diagnosis not present

## 2023-04-07 DIAGNOSIS — Z9841 Cataract extraction status, right eye: Secondary | ICD-10-CM | POA: Diagnosis not present

## 2023-04-07 DIAGNOSIS — Z9181 History of falling: Secondary | ICD-10-CM | POA: Diagnosis not present

## 2023-04-07 DIAGNOSIS — D631 Anemia in chronic kidney disease: Secondary | ICD-10-CM | POA: Diagnosis not present

## 2023-04-07 MED ORDER — AMOXICILLIN-POT CLAVULANATE 875-125 MG PO TABS: 1.0000 | ORAL_TABLET | Freq: Two times a day (BID) | ORAL | 0 refills | Status: AC

## 2023-04-07 NOTE — Telephone Encounter (Signed)
Routing to provider who saw the patient.  

## 2023-04-07 NOTE — Telephone Encounter (Signed)
Pt's sister is calling in because she was told to reach out to the office if pt got worse and pt is getting worse. Please follow up with [name].

## 2023-04-08 NOTE — Telephone Encounter (Signed)
Called and notified patient's sister that medication was sent in.

## 2023-04-22 DIAGNOSIS — N181 Chronic kidney disease, stage 1: Secondary | ICD-10-CM | POA: Diagnosis not present

## 2023-04-22 DIAGNOSIS — M810 Age-related osteoporosis without current pathological fracture: Secondary | ICD-10-CM | POA: Diagnosis not present

## 2023-04-22 DIAGNOSIS — R531 Weakness: Secondary | ICD-10-CM | POA: Diagnosis not present

## 2023-04-22 DIAGNOSIS — M1990 Unspecified osteoarthritis, unspecified site: Secondary | ICD-10-CM | POA: Diagnosis not present

## 2023-04-22 DIAGNOSIS — Z9841 Cataract extraction status, right eye: Secondary | ICD-10-CM | POA: Diagnosis not present

## 2023-04-22 DIAGNOSIS — Z961 Presence of intraocular lens: Secondary | ICD-10-CM | POA: Diagnosis not present

## 2023-04-22 DIAGNOSIS — Z7984 Long term (current) use of oral hypoglycemic drugs: Secondary | ICD-10-CM | POA: Diagnosis not present

## 2023-04-22 DIAGNOSIS — K219 Gastro-esophageal reflux disease without esophagitis: Secondary | ICD-10-CM | POA: Diagnosis not present

## 2023-04-22 DIAGNOSIS — D631 Anemia in chronic kidney disease: Secondary | ICD-10-CM | POA: Diagnosis not present

## 2023-04-22 DIAGNOSIS — Z9181 History of falling: Secondary | ICD-10-CM | POA: Diagnosis not present

## 2023-04-22 DIAGNOSIS — Z9842 Cataract extraction status, left eye: Secondary | ICD-10-CM | POA: Diagnosis not present

## 2023-04-22 DIAGNOSIS — R29898 Other symptoms and signs involving the musculoskeletal system: Secondary | ICD-10-CM | POA: Diagnosis not present

## 2023-04-22 DIAGNOSIS — Z556 Problems related to health literacy: Secondary | ICD-10-CM | POA: Diagnosis not present

## 2023-04-22 DIAGNOSIS — K2270 Barrett's esophagus without dysplasia: Secondary | ICD-10-CM | POA: Diagnosis not present

## 2023-04-22 DIAGNOSIS — R7303 Prediabetes: Secondary | ICD-10-CM | POA: Diagnosis not present

## 2023-04-22 DIAGNOSIS — I129 Hypertensive chronic kidney disease with stage 1 through stage 4 chronic kidney disease, or unspecified chronic kidney disease: Secondary | ICD-10-CM | POA: Diagnosis not present

## 2023-04-22 DIAGNOSIS — G4700 Insomnia, unspecified: Secondary | ICD-10-CM | POA: Diagnosis not present

## 2023-04-22 DIAGNOSIS — E782 Mixed hyperlipidemia: Secondary | ICD-10-CM | POA: Diagnosis not present

## 2023-04-27 DIAGNOSIS — E119 Type 2 diabetes mellitus without complications: Secondary | ICD-10-CM | POA: Diagnosis not present

## 2023-04-27 DIAGNOSIS — Z961 Presence of intraocular lens: Secondary | ICD-10-CM | POA: Diagnosis not present

## 2023-04-27 DIAGNOSIS — H26493 Other secondary cataract, bilateral: Secondary | ICD-10-CM | POA: Diagnosis not present

## 2023-05-03 DIAGNOSIS — H26491 Other secondary cataract, right eye: Secondary | ICD-10-CM | POA: Diagnosis not present

## 2023-05-17 DIAGNOSIS — H26492 Other secondary cataract, left eye: Secondary | ICD-10-CM | POA: Diagnosis not present

## 2023-05-25 ENCOUNTER — Other Ambulatory Visit: Payer: Self-pay | Admitting: Nurse Practitioner

## 2023-05-25 ENCOUNTER — Encounter: Payer: Self-pay | Admitting: Nurse Practitioner

## 2023-05-25 ENCOUNTER — Ambulatory Visit: Payer: 59 | Admitting: Nurse Practitioner

## 2023-05-25 VITALS — BP 126/83 | HR 65 | Ht 70.5 in | Wt 183.6 lb

## 2023-05-25 DIAGNOSIS — F200 Paranoid schizophrenia: Secondary | ICD-10-CM | POA: Diagnosis not present

## 2023-05-25 DIAGNOSIS — I10 Essential (primary) hypertension: Secondary | ICD-10-CM | POA: Diagnosis not present

## 2023-05-25 DIAGNOSIS — R7303 Prediabetes: Secondary | ICD-10-CM

## 2023-05-25 DIAGNOSIS — D649 Anemia, unspecified: Secondary | ICD-10-CM | POA: Diagnosis not present

## 2023-05-25 DIAGNOSIS — E782 Mixed hyperlipidemia: Secondary | ICD-10-CM

## 2023-05-25 DIAGNOSIS — K2270 Barrett's esophagus without dysplasia: Secondary | ICD-10-CM | POA: Diagnosis not present

## 2023-05-25 DIAGNOSIS — N181 Chronic kidney disease, stage 1: Secondary | ICD-10-CM

## 2023-05-25 LAB — MICROALBUMIN, URINE WAIVED
Creatinine, Urine Waived: 300 mg/dL (ref 10–300)
Microalb, Ur Waived: 80 mg/L — ABNORMAL HIGH (ref 0–19)

## 2023-05-25 MED ORDER — LORAZEPAM 0.5 MG PO TABS: ORAL_TABLET | ORAL | 2 refills | Status: DC

## 2023-05-25 MED ORDER — PANTOPRAZOLE SODIUM 40 MG PO TBEC: 40.0000 mg | DELAYED_RELEASE_TABLET | Freq: Every day | ORAL | 1 refills | Status: DC

## 2023-05-25 NOTE — Assessment & Plan Note (Signed)
 New referral placed for GI.  Will change to pantoprazole . Recommend seeing GI for evaluation of Barrett's Esophagus.

## 2023-05-25 NOTE — Assessment & Plan Note (Signed)
Chronic.  Controlled.  Continue with current medication regimen of Simvastatin.  Labs ordered today.  Return to clinic in 3 months for reevaluation.  Call sooner if concerns arise.

## 2023-05-25 NOTE — Assessment & Plan Note (Signed)
 Chronic.  Controlled.  Continue with current medication regimen.  Labs ordered today.  Return to clinic in 6 months for reevaluation.  Call sooner if concerns arise.  ? ?

## 2023-05-25 NOTE — Assessment & Plan Note (Signed)
 Chronic. Controlled.  Continue Lisinopril 10mg  daily.  Recommend checking blood pressures at home and bringing log to next visit.  Follow up in 3 month for reevaluation.  Labs ordered today.

## 2023-05-25 NOTE — Assessment & Plan Note (Signed)
Chronic. Controlled without medication.  Patient does take Lorazepam at bedtime to help with sleep and his mood.  Working well for him.  Discussion had with him and his sister, Darlyn Read, who is his legal guardian the risks involved taking long term Benzodiazepines. Okay with risks and would like to continue.  Patient has been taking medication for many years.  Follow up in 3 months.  Call sooner if concerns arise.

## 2023-05-25 NOTE — Assessment & Plan Note (Signed)
 Chronic. Controlled.  Labs ordered at visit today. Will make recommendations based on lab results.

## 2023-05-25 NOTE — Assessment & Plan Note (Signed)
 Labs ordered at visit today.  Will make recommendations based on lab results.

## 2023-05-25 NOTE — Progress Notes (Signed)
 BP 126/83 (BP Location: Right Arm, Patient Position: Sitting, Cuff Size: Large)   Pulse 65   Ht 5' 10.5 (1.791 m)   Wt 183 lb 9.6 oz (83.3 kg)   SpO2 97%   BMI 25.97 kg/m    Subjective:    Patient ID: George Glenn, male    DOB: [birth year], 63 y.o.   MRN: [id number]  HPI: George Glenn is a 63 y.o. male  Chief Complaint  Patient presents with   Hyperlipidemia   Hypertension   GI Problem   Medication Refill    Prefers 90 refills    HYPERLIPIDEMIA Hyperlipidemia status: chronic Satisfied with current treatment?  yes Side effects:  no Medication compliance: excellent compliance Past cholesterol meds: simvastatin  (zocor ) Supplements: none Aspirin:  no The 10-year ASCVD risk score (Arnett DK, et al., 2019) is: 19.8%   Values used to calculate the score:     Age: 51 years     Sex: Male     Is Non-Hispanic African American: No     Diabetic: Yes     Tobacco smoker: No     Systolic Blood Pressure: 108 mmHg     Is BP treated: Yes     HDL Cholesterol: 25 mg/dL     Total Cholesterol: 134 mg/dL Chest pain:  no  ANEMIA Anemia status: controlled Etiology of anemia: Duration of anemia treatment:  Compliance with treatment: excellent compliance Iron supplementation side effects: no Severity of anemia: mild Fatigue: no Decreased exercise tolerance: no  Dyspnea on exertion: no Palpitations: no Bleeding: no Pica: no  INSOMNIA/MOOD Doing well with lorazepam . No side effects.  Denies concerns at visit today.    CHRONIC KIDNEY DISEASE CKD status: stable Medications renally dose: yes Previous renal evaluation: no Pneumovax:  Up to Date Influenza Vaccine:  Up to Date  Patient states he has been having an upset stomach lately.  Patient states he feels like he has to throw up.  Points to epigastric area as location of pain.  States it hurts a little bit when he pushes on it.  He has been more grumpy with his stomach hurting him recently.  Eating makes his stomach  feel better.   Relevant past medical, surgical, family and social history reviewed and updated as indicated. Interim medical history since our last visit reviewed. Allergies and medications reviewed and updated.  Review of Systems  Eyes:  Negative for visual disturbance.  Respiratory:  Negative for chest tightness and shortness of breath.   Cardiovascular:  Negative for chest pain, palpitations and leg swelling.  Gastrointestinal:  Positive for abdominal pain.  Neurological:  Negative for dizziness, light-headedness and headaches.    Per HPI unless specifically indicated above     Objective:    BP 126/83 (BP Location: Right Arm, Patient Position: Sitting, Cuff Size: Large)   Pulse 65   Ht 5' 10.5 (1.791 m)   Wt 183 lb 9.6 oz (83.3 kg)   SpO2 97%   BMI 25.97 kg/m   Wt Readings from Last 3 Encounters:  05/25/23 183 lb 9.6 oz (83.3 kg)  04/05/23 187 lb 12.8 oz (85.2 kg)  02/22/23 188 lb 12.8 oz (85.6 kg)    Physical Exam Vitals and nursing note reviewed.  Constitutional:      General: He is not in acute distress.    Appearance: Normal appearance. He is not ill-appearing, toxic-appearing or diaphoretic.  HENT:     Head: Normocephalic.     Right Ear: External ear normal.  Left Ear: External ear normal.     Nose: Nose normal. No congestion or rhinorrhea.     Mouth/Throat:     Mouth: Mucous membranes are moist.  Eyes:     General:        Right eye: No discharge.        Left eye: No discharge.     Extraocular Movements: Extraocular movements intact.     Conjunctiva/sclera: Conjunctivae normal.     Pupils: Pupils are equal, round, and reactive to light.  Cardiovascular:     Rate and Rhythm: Normal rate and regular rhythm.     Heart sounds: No murmur heard. Pulmonary:     Effort: Pulmonary effort is normal. No respiratory distress.     Breath sounds: Normal breath sounds. No wheezing, rhonchi or rales.  Abdominal:     General: Abdomen is flat. Bowel sounds are  normal. There is no distension.     Palpations: There is no mass.     Tenderness: There is no abdominal tenderness. There is no right CVA tenderness, left CVA tenderness, guarding or rebound.     Hernia: No hernia is present.  Musculoskeletal:     Cervical back: Normal range of motion and neck supple.  Skin:    General: Skin is warm and dry.     Capillary Refill: Capillary refill takes less than 2 seconds.  Neurological:     General: No focal deficit present.     Mental Status: He is alert and oriented to person, place, and time.  Psychiatric:        Mood and Affect: Mood normal.        Behavior: Behavior normal.        Thought Content: Thought content normal.        Judgment: Judgment normal.     Results for orders placed or performed in visit on 02/22/23  Comp Met (CMET)   Collection Time: 02/22/23  2:47 PM  Result Value Ref Range   Glucose 114 (H) 70 - 99 mg/dL   BUN 17 8 - 27 mg/dL   Creatinine, Ser 9.03 0.76 - 1.27 mg/dL   eGFR 89 >40 fO/fpw/8.26   BUN/Creatinine Ratio 18 10 - 24   Sodium 142 134 - 144 mmol/L   Potassium 4.5 3.5 - 5.2 mmol/L   Chloride 103 96 - 106 mmol/L   CO2 21 20 - 29 mmol/L   Calcium 8.7 8.6 - 10.2 mg/dL   Total Protein 7.1 6.0 - 8.5 g/dL   Albumin 4.4 3.9 - 4.9 g/dL   Globulin, Total 2.7 1.5 - 4.5 g/dL   Bilirubin Total 0.4 0.0 - 1.2 mg/dL   Alkaline Phosphatase 100 44 - 121 IU/L   AST 25 0 - 40 IU/L   ALT 32 0 - 44 IU/L  CBC w/Diff   Collection Time: 02/22/23  2:47 PM  Result Value Ref Range   WBC 4.9 3.4 - 10.8 x10E3/uL   RBC 5.66 4.14 - 5.80 x10E6/uL   Hemoglobin 16.4 13.0 - 17.7 g/dL   Hematocrit 50.2 62.4 - 51.0 %   MCV 88 79 - 97 fL   MCH 29.0 26.6 - 33.0 pg   MCHC 33.0 31.5 - 35.7 g/dL   RDW 86.4 88.3 - 84.5 %   Platelets 235 150 - 450 x10E3/uL   Neutrophils 52 Not Estab. %   Lymphs 34 Not Estab. %   Monocytes 10 Not Estab. %   Eos 3 Not Estab. %   Basos 1  Not Estab. %   Neutrophils Absolute 2.6 1.4 - 7.0 x10E3/uL    Lymphocytes Absolute 1.7 0.7 - 3.1 x10E3/uL   Monocytes Absolute 0.5 0.1 - 0.9 x10E3/uL   EOS (ABSOLUTE) 0.1 0.0 - 0.4 x10E3/uL   Basophils Absolute 0.1 0.0 - 0.2 x10E3/uL   Immature Granulocytes 0 Not Estab. %   Immature Grans (Abs) 0.0 0.0 - 0.1 x10E3/uL  HgB A1c   Collection Time: 02/22/23  2:47 PM  Result Value Ref Range   Hgb A1c MFr Bld 6.3 (H) 4.8 - 5.6 %   Est. average glucose Bld gHb Est-mCnc 134 mg/dL  Lipid Profile   Collection Time: 02/22/23  2:47 PM  Result Value Ref Range   Cholesterol, Total 144 100 - 199 mg/dL   Triglycerides 772 (H) 0 - 149 mg/dL   HDL 24 (L) >60 mg/dL   VLDL Cholesterol Cal 38 5 - 40 mg/dL   LDL Chol Calc (NIH) 82 0 - 99 mg/dL   Chol/HDL Ratio 6.0 (H) 0.0 - 5.0 ratio  PSA   Collection Time: 02/22/23  2:47 PM  Result Value Ref Range   Prostate Specific Ag, Serum 0.5 0.0 - 4.0 ng/mL      Assessment & Plan:   Problem List Items Addressed This Visit       Cardiovascular and Mediastinum   Hypertension   Chronic. Controlled.  Continue Lisinopril  10mg  daily.  Recommend checking blood pressures at home and bringing log to next visit.  Follow up in 3 month for reevaluation.  Labs ordered today.          Digestive   Barrett's esophagus   New referral placed for GI.  Will change to pantoprazole . Recommend seeing GI for evaluation of Barrett's Esophagus.       Relevant Orders   Ambulatory referral to Gastroenterology     Genitourinary   CKD (chronic kidney disease)   Chronic.  Controlled.  Labs ordered at visit today.  Will make recommendations based on lab results.       Relevant Orders   Comp Met (CMET)   Microalbumin, Urine Waived     Other   Paranoid schizophrenia (HCC) - Primary   Chronic. Controlled without medication.  Patient does take Lorazepam  at bedtime to help with sleep and his mood.  Working well for him.  Discussion had with him and his sister, George Glenn, who is his legal guardian the risks involved taking long term  Benzodiazepines. Okay with risks and would like to continue.  Patient has been taking medication for many years.  Follow up in 3 months.  Call sooner if concerns arise.       Anemia   Chronic.  Controlled.  Continue with current medication regimen.  Labs ordered today.  Return to clinic in 6 months for reevaluation.  Call sooner if concerns arise.       Relevant Orders   CBC w/Diff   Hyperlipidemia   Chronic.  Controlled.  Continue with current medication regimen of Simvastatin .  Labs ordered today.  Return to clinic in 3 months for reevaluation.  Call sooner if concerns arise.       Prediabetes   Labs ordered at visit today.  Will make recommendations based on lab results.        Relevant Orders   HgB A1c     Follow up plan: No follow-ups on file.

## 2023-05-26 ENCOUNTER — Encounter: Payer: Self-pay | Admitting: Nurse Practitioner

## 2023-05-26 LAB — CBC WITH DIFFERENTIAL/PLATELET
Basophils Absolute: 0 10*3/uL (ref 0.0–0.2)
Basos: 1 %
EOS (ABSOLUTE): 0.1 10*3/uL (ref 0.0–0.4)
Eos: 3 %
Hematocrit: 50.2 % (ref 37.5–51.0)
Hemoglobin: 16.7 g/dL (ref 13.0–17.7)
Immature Grans (Abs): 0 10*3/uL (ref 0.0–0.1)
Immature Granulocytes: 0 %
Lymphocytes Absolute: 1.9 10*3/uL (ref 0.7–3.1)
Lymphs: 40 %
MCH: 29.2 pg (ref 26.6–33.0)
MCHC: 33.3 g/dL (ref 31.5–35.7)
MCV: 88 fL (ref 79–97)
Monocytes Absolute: 0.5 10*3/uL (ref 0.1–0.9)
Monocytes: 10 %
Neutrophils Absolute: 2.2 10*3/uL (ref 1.4–7.0)
Neutrophils: 46 %
Platelets: 234 10*3/uL (ref 150–450)
RBC: 5.71 x10E6/uL (ref 4.14–5.80)
RDW: 13.2 % (ref 11.6–15.4)
WBC: 4.8 10*3/uL (ref 3.4–10.8)

## 2023-05-26 LAB — COMPREHENSIVE METABOLIC PANEL
ALT: 26 [IU]/L (ref 0–44)
AST: 22 [IU]/L (ref 0–40)
Albumin: 4.4 g/dL (ref 3.9–4.9)
Alkaline Phosphatase: 96 [IU]/L (ref 44–121)
BUN/Creatinine Ratio: 15 (ref 10–24)
BUN: 12 mg/dL (ref 8–27)
Bilirubin Total: 0.6 mg/dL (ref 0.0–1.2)
CO2: 24 mmol/L (ref 20–29)
Calcium: 8.9 mg/dL (ref 8.6–10.2)
Chloride: 102 mmol/L (ref 96–106)
Creatinine, Ser: 0.78 mg/dL (ref 0.76–1.27)
Globulin, Total: 3.1 g/dL (ref 1.5–4.5)
Glucose: 78 mg/dL (ref 70–99)
Potassium: 4.7 mmol/L (ref 3.5–5.2)
Sodium: 141 mmol/L (ref 134–144)
Total Protein: 7.5 g/dL (ref 6.0–8.5)
eGFR: 100 mL/min/{1.73_m2} (ref >59)

## 2023-05-26 LAB — HEMOGLOBIN A1C
Est. average glucose Bld gHb Est-mCnc: 128 mg/dL
Hgb A1c MFr Bld: 6.1 % — ABNORMAL HIGH (ref 4.8–5.6)

## 2023-05-26 NOTE — Telephone Encounter (Signed)
 Requested Prescriptions  Pending Prescriptions Disp Refills   JARDIANCE  10 MG TABS tablet [Pharmacy Med Name: JARDIANCE  10MG  TABLETS] 90 tablet 0    Sig: TAKE 1 TABLET(10 MG) BY MOUTH DAILY BEFORE BREAKFAST     Endocrinology:  Diabetes - SGLT2 Inhibitors Passed - 05/26/2023  1:11 PM      Passed - Cr in normal range and within 360 days    Creatinine, Ser  Date Value Ref Range Status  05/25/2023 0.78 0.76 - 1.27 mg/dL Final         Passed - HBA1C is between 0 and 7.9 and within 180 days    HB A1C (BAYER DCA - WAIVED)  Date Value Ref Range Status  07/18/2020 5.7 <7.0 % Final    Comment:                                          Diabetic Adult            <7.0                                       Healthy Adult        4.3 - 5.7                                                           (DCCT/NGSP) American Diabetes Association's Summary of Glycemic Recommendations for Adults with Diabetes: Hemoglobin A1c <7.0%. More stringent glycemic goals (A1c <6.0%) may further reduce complications at the cost of increased risk of hypoglycemia.    Hgb A1c MFr Bld  Date Value Ref Range Status  05/25/2023 6.1 (H) 4.8 - 5.6 % Final    Comment:             Prediabetes: 5.7 - 6.4          Diabetes: >6.4          Glycemic control for adults with diabetes: <7.0          Passed - eGFR in normal range and within 360 days    GFR calc Af Amer  Date Value Ref Range Status  12/13/2019 104 >59 mL/min/1.73 Final    Comment:    **[facility] currently reports eGFR in compliance with the current**   recommendations of the [employer]. [facility] will   update reporting as new guidelines are published from the NKF-ASN   Task force.    GFR calc non Af Amer  Date Value Ref Range Status  12/13/2019 90 >59 mL/min/1.73 Final   eGFR  Date Value Ref Range Status  05/25/2023 100 >59 mL/min/1.73 Final         Passed - Valid encounter within last 6 months    Recent Outpatient Visits           1  month ago Upper respiratory tract infection, unspecified type   [place] [facility] [place], Melanie T, NP   3 months ago Encounter for annual wellness exam in Medicare patient   [place] [facility] Melvin Pao, NP   9 months ago Paranoid schizophrenia [facility])   [place] [facility] Melvin Pao, NP   1 year ago Paranoid schizophrenia [facility])   [place] [facility] Melvin Pao, NP   1 year ago Encounter for annual wellness exam in Medicare patient   [place] [facility] Melvin Pao, NP       Future Appointments             In 3 months Melvin Pao, NP [place] [facility], [facility]

## 2023-05-30 ENCOUNTER — Telehealth: Payer: Self-pay

## 2023-05-30 ENCOUNTER — Other Ambulatory Visit: Payer: Self-pay | Admitting: Nurse Practitioner

## 2023-05-30 NOTE — Telephone Encounter (Signed)
 Patient's sister called stating that when her brother had an EGD on 08/27/2021, she was told that her brother had barrett's esophagus and she stated that his PCP wanted him to have another one done since he's been having some abdominal pain. Please advise. Patient also had a colonoscopy done on the same day but he is due for another colonoscopy until 08/27/2024.

## 2023-05-31 NOTE — Telephone Encounter (Signed)
Reordered 02/28/23 #90 3 RF  Requested Prescriptions  Refused Prescriptions Disp Refills   simvastatin (ZOCOR) 10 MG tablet [Pharmacy Med Name: SIMVASTATIN 10MG  TABLETS] 90 tablet 3    Sig: TAKE 1 TABLET(10 MG) BY MOUTH DAILY     Cardiovascular:  Antilipid - Statins Failed - 05/31/2023 11:32 AM      Failed - Lipid Panel in normal range within the last 12 months    Cholesterol, Total  Date Value Ref Range Status  02/22/2023 144 100 - 199 mg/dL Final   LDL Chol Calc (NIH)  Date Value Ref Range Status  02/22/2023 82 0 - 99 mg/dL Final   HDL  Date Value Ref Range Status  02/22/2023 24 (L) >39 mg/dL Final   Triglycerides  Date Value Ref Range Status  02/22/2023 227 (H) 0 - 149 mg/dL Final         Passed - Patient is not pregnant      Passed - Valid encounter within last 12 months    Recent Outpatient Visits           1 month ago Upper respiratory tract infection, unspecified type   [place] [facility] [place], Corrie Dandy T, NP   3 months ago Encounter for annual wellness exam in Medicare patient   [place] [facility] Larae Grooms, NP   9 months ago Paranoid schizophrenia [facility])   [place] [facility] Larae Grooms, NP   1 year ago Paranoid schizophrenia [facility])   [place] [facility] Larae Grooms, NP   1 year ago Encounter for annual wellness exam in Medicare patient   [place] [facility] Larae Grooms, NP       Future Appointments             In 2 months Larae Grooms, NP [place] [facility], [facility]

## 2023-06-01 NOTE — Telephone Encounter (Signed)
Called patient's sister-[name] to let her know that we would be seeing her brother and that she would be able to see it in his mychart. [name] agreed and had no further questions.

## 2023-06-07 ENCOUNTER — Other Ambulatory Visit: Payer: Self-pay | Admitting: Nurse Practitioner

## 2023-06-08 NOTE — Telephone Encounter (Signed)
 Call to pharmacy-per Kianna no Rx on file for that date- will resend.  Requested Prescriptions  Pending Prescriptions Disp Refills   simvastatin (ZOCOR) 10 MG tablet [Pharmacy Med Name: SIMVASTATIN 10MG  TABLETS] 90 tablet 3    Sig: TAKE 1 TABLET(10 MG) BY MOUTH DAILY     Cardiovascular:  Antilipid - Statins Failed - 06/08/2023  2:05 PM      Failed - Lipid Panel in normal range within the last 12 months    Cholesterol, Total  Date Value Ref Range Status  02/22/2023 144 100 - 199 mg/dL Final   LDL Chol Calc (NIH)  Date Value Ref Range Status  02/22/2023 82 0 - 99 mg/dL Final   HDL  Date Value Ref Range Status  02/22/2023 24 (L) >39 mg/dL Final   Triglycerides  Date Value Ref Range Status  02/22/2023 227 (H) 0 - 149 mg/dL Final         Passed - Patient is not pregnant      Passed - Valid encounter within last 12 months    Recent Outpatient Visits           2 months ago Upper respiratory tract infection, unspecified type   [place] [facility] [place], Corrie Dandy T, NP   3 months ago Encounter for annual wellness exam in Medicare patient   [place] [facility] Larae Grooms, NP   9 months ago Paranoid schizophrenia [facility])   [place] [facility] Larae Grooms, NP   1 year ago Paranoid schizophrenia [facility])   [place] [facility] Larae Grooms, NP   1 year ago Encounter for annual wellness exam in Medicare patient   [place] [facility] Larae Grooms, NP       Future Appointments             In 1 month Wyline Mood, MD [facility] [place] [place]   In 2 months Larae Grooms, NP [place] [facility], [facility]

## 2023-06-13 ENCOUNTER — Ambulatory Visit: Payer: Self-pay | Admitting: Nurse Practitioner

## 2023-06-13 DIAGNOSIS — K2270 Barrett's esophagus without dysplasia: Secondary | ICD-10-CM

## 2023-06-13 NOTE — Telephone Encounter (Signed)
 Chief Complaint: Epigastric pain exacerbation Symptoms: Epigastric pain Frequency: Worsened over 3 weeks Pertinent Negatives: Patient denies cardiac symptoms Disposition: [] ED /[] Urgent Care (no appt availability in office) / [x] Appointment(In office/virtual)/ []  [place] [facility]/ [] Home Care/ [] Refused Recommended Disposition /[] [place] [facility]/ []  Follow-up with PCP Additional Notes: Patient's sister called in stating she is patient's caretaker and wants to report that medication change to Pantoprazole 40 mg has not been controlling patients epigastric pain at all. Patients symptoms are getting worse and patient's sister states she would like advice from PCP on what to do, otherwise she will have to take him to the hospital if he gets much worse. Patient's sister states she reached out to [employer] but has not heard back yet. Patient's sister would like call back from PCP regarding if medication can be changed or what to do. Offered soonest appt for PCP to patient but patient's sister has a scheduling conflict and states "I will wait to hear from Clydie Braun NP on advice on what to do". Please advise if medication change should be made.    Copied from CRM [id number]. Topic: Clinical - Red Word Triage >> Jun 13, 2023  5:40 PM Alessandra Bevels wrote: Red Word that prompted transfer to Nurse Triage: Patient's sister is calling to report that the patient has upper abdominal pain for about 3 weeks. Medication changed to pantoprazole (PROTONIX) 40 MG tablet [[id number]]. It is not helping. Pt is very uncomfortable. Please advise Reason for Disposition  [1] MILD pain (e.g., does not interfere with normal activities) AND [2] comes and goes (cramps) AND [3] present > 72 hours  (Exception: This same abdominal pain is a chronic symptom recurrent or ongoing AND present > 4 weeks.)  Answer Assessment - Initial Assessment Questions 1. LOCATION: "Where does it hurt?"      Upper middle abdomen between  base of rib cage and belly button 2. RADIATION: "Does the pain shoot anywhere else?" (e.g., chest, back)     N/a 3. ONSET: "When did the pain begin?" (e.g., minutes, hours or days ago)      Worsened over last 3 weeks 4. SUDDEN: "Gradual or sudden onset?"     Ongoing 5. PATTERN "Does the pain come and go, or is it constant?"    - If it comes and goes: "How long does it last?" "Do you have pain now?"     (Note: Comes and goes means the pain is intermittent. It goes away completely between bouts.)    - If constant: "Is it getting better, staying the same, or getting worse?"      (Note: Constant means the pain never goes away completely; most serious pain is constant and gets worse.)      Constant 6. SEVERITY: "How bad is the pain?"  (e.g., Scale 1-10; mild, moderate, or severe)    - MILD (1-3): Doesn't interfere with normal activities, abdomen soft and not tender to touch..     - MODERATE (4-7): Interferes with normal activities or awakens from sleep, abdomen tender to touch.     - SEVERE (8-10): Excruciating pain, doubled over, unable to do any normal activities.       Moderate 7. RECURRENT SYMPTOM: "Have you ever had this type of stomach pain before?" If Yes, ask: "When was the last time?" and "What happened that time?"      Yes - patient has Barretts  8. AGGRAVATING FACTORS: "Does anything seem to cause this pain?" (e.g., foods, stress, alcohol)  Barretts  9. CARDIAC SYMPTOMS: "Do you have any of the following symptoms: chest pain, difficulty breathing, sweating, nausea?"     N/a 10. OTHER SYMPTOMS: "Do you have any other symptoms?" (e.g., back pain, diarrhea, fever, urination pain, vomiting)       No  Protocols used: Abdominal Pain - Upper-A-AH

## 2023-06-14 MED ORDER — PANTOPRAZOLE SODIUM 40 MG PO TBEC: 40.0000 mg | DELAYED_RELEASE_TABLET | Freq: Two times a day (BID) | ORAL | 1 refills | Status: DC

## 2023-06-14 NOTE — Addendum Note (Signed)
 Addended by: Larae Grooms on: 06/14/2023 01:04 PM   Modules accepted: Orders

## 2023-06-14 NOTE — Telephone Encounter (Signed)
 New referral placed.

## 2023-06-14 NOTE — Addendum Note (Signed)
 Addended by: Larae Grooms on: 06/14/2023 04:00 PM   Modules accepted: Orders

## 2023-06-14 NOTE — Telephone Encounter (Signed)
 Called and notified patient's sister of Karen's message. Sister states that they would like a new referral for GI so that they can get a different opinion. Advised patient's sister that I would send this to Clydie Braun to see about putting in the new referral.

## 2023-06-14 NOTE — Telephone Encounter (Signed)
 I increased the pantoprazole to 40mg  twice daily.  He really needs to see GI if this isn't helping his symptoms.  I recommend she call [facility] again to see if they can see him soon.

## 2023-06-14 NOTE — Telephone Encounter (Signed)
 Routing to provider to advise.

## 2023-06-27 ENCOUNTER — Other Ambulatory Visit: Payer: Self-pay | Admitting: Nurse Practitioner

## 2023-06-28 NOTE — Telephone Encounter (Signed)
 Requested Prescriptions  Pending Prescriptions Disp Refills   lisinopril (ZESTRIL) 10 MG tablet [Pharmacy Med Name: LISINOPRIL 10MG  TABLETS] 90 tablet 0    Sig: TAKE 1 TABLET(10 MG) BY MOUTH DAILY     Cardiovascular:  ACE Inhibitors Passed - 06/28/2023  2:35 PM      Passed - Cr in normal range and within 180 days    Creatinine, Ser  Date Value Ref Range Status  05/25/2023 0.78 0.76 - 1.27 mg/dL Final         Passed - K in normal range and within 180 days    Potassium  Date Value Ref Range Status  05/25/2023 4.7 3.5 - 5.2 mmol/L Final         Passed - Patient is not pregnant      Passed - Last BP in normal range    BP Readings from Last 1 Encounters:  05/25/23 126/83         Passed - Valid encounter within last 6 months    Recent Outpatient Visits           2 months ago Upper respiratory tract infection, unspecified type   [place] [facility] [place], Corrie Dandy T, NP   4 months ago Encounter for annual wellness exam in Medicare patient   [place] [facility] Larae Grooms, NP   10 months ago Paranoid schizophrenia [facility])   [place] [facility] Larae Grooms, NP   1 year ago Paranoid schizophrenia [facility])   [place] [facility] Larae Grooms, NP   1 year ago Encounter for annual wellness exam in Medicare patient   [place] [facility] Larae Grooms, NP       Future Appointments             In 1 week Wyline Mood, MD [facility] [place]   In 1 month Larae Grooms, NP [place] [facility], [facility]

## 2023-07-11 ENCOUNTER — Ambulatory Visit: Payer: 59 | Admitting: Gastroenterology

## 2023-08-04 ENCOUNTER — Other Ambulatory Visit: Payer: Self-pay

## 2023-08-15 ENCOUNTER — Ambulatory Visit (INDEPENDENT_AMBULATORY_CARE_PROVIDER_SITE_OTHER): Admitting: Gastroenterology

## 2023-08-15 ENCOUNTER — Encounter: Payer: Self-pay | Admitting: Gastroenterology

## 2023-08-15 VITALS — BP 130/65 | HR 82 | Temp 98.3°F | Wt 185.4 lb

## 2023-08-15 DIAGNOSIS — K2270 Barrett's esophagus without dysplasia: Secondary | ICD-10-CM | POA: Diagnosis not present

## 2023-08-15 DIAGNOSIS — Z860100 Personal history of colon polyps, unspecified: Secondary | ICD-10-CM | POA: Diagnosis not present

## 2023-08-15 NOTE — Patient Instructions (Signed)
 We will reach out to you when you are due for your colonoscopy until 09/2024.

## 2023-08-15 NOTE — Progress Notes (Signed)
 Luke Salaam MD, MRCP(U.K) [address]  [address]  [place], [place] [address]  Main: [phone]  Fax: [phone]   Primary Care Physician: Aileen Alexanders, NP  Primary Gastroenterologist:  Dr. Luke Salaam   Chief Complaint  Patient presents with   barrett's esophagus    HPI: George Glenn is a 63 y.o. male  Summary of history :  Initially referred and seen in 08/2021 for Barrett's esophagus. I performed an upper endoscopy in January 2018 on him. At that point of time it was performed for surveillance due to personal history of Barrett's esophagus. During the procedure the patient developed significant hypoxia and the procedure could not become pleated. The patient had a large type II paraesophageal hernia. Limited exam due to desaturation. Salmon-colored mucosa was seen suggestive of long segment of Barrett's esophagus but no specimens were collected. .   Interval history   08/19/2021-08/15/2023   08/27/2021: EGD: 11 cm long segment of Barrettes esophagis bx taken confirmed barrettes esophagus but no dysplasia. Colonoscopy : 5 sessile polyps < 1 cm resected- tubular adenomas   The patient and family were here today to discuss about scheduling surveillance endoscopy due to history of Barrett's esophagus recently also had some epigastric discomfort had PPI changed to Protonix  which has helped.   Current Outpatient Medications  Medication Sig Dispense Refill   Cyanocobalamin (VITAMIN B 12 PO) Take 1 tablet by mouth daily.      Docusate Calcium (STOOL SOFTENER PO) Take 1 capsule by mouth daily as needed.     JARDIANCE  10 MG TABS tablet TAKE 1 TABLET(10 MG) BY MOUTH DAILY BEFORE BREAKFAST 90 tablet 0   lisinopril  (ZESTRIL ) 10 MG tablet TAKE 1 TABLET(10 MG) BY MOUTH DAILY 90 tablet 1   LORazepam  (ATIVAN ) 0.5 MG tablet TAKE 1 TABLET BY MOUTH EVERY NIGHT AT BEDTIME 30 tablet 2   meclizine  (ANTIVERT ) 25 MG tablet TAKE 1 TABLET BY MOUTH AS NEEDED FOR DIZZINESS 90 tablet 1    pantoprazole  (PROTONIX ) 40 MG tablet Take 40 mg by mouth 2 (two) times daily.     simvastatin  (ZOCOR ) 10 MG tablet TAKE 1 TABLET(10 MG) BY MOUTH DAILY 90 tablet 2   triamcinolone  cream (KENALOG ) 0.1 % Apply 1 Application topically 2 (two) times daily. 30 g 0   No current facility-administered medications for this visit.    Allergies as of 08/15/2023   (No Known Allergies)    ROS:  General: Negative for anorexia, weight loss, fever, chills, fatigue, weakness. ENT: Negative for hoarseness, difficulty swallowing , nasal congestion. CV: Negative for chest pain, angina, palpitations, dyspnea on exertion, peripheral edema.  Respiratory: Negative for dyspnea at rest, dyspnea on exertion, cough, sputum, wheezing.  GI: See history of present illness. GU:  Negative for dysuria, hematuria, urinary incontinence, urinary frequency, nocturnal urination.  Endo: Negative for unusual weight change.    Physical Examination:   BP 130/65   Pulse 82   Temp 98.3 F (36.8 C) (Oral)   Wt 185 lb 6.4 oz (84.1 kg)   BMI 26.23 kg/m   General: Well-nourished, well-developed in no acute distress.  Eyes: No icterus. Conjunctivae pink. Mouth: Oropharyngeal mucosa moist and pink , no lesions erythema or exudate. Neuro: Alert and oriented x 3.  Grossly intact. Skin: Warm and dry, no jaundice.   Psych: Alert and cooperative, normal mood and affect.   Imaging Studies: No results found.  Assessment and Plan:   George Glenn is a 63 y.o. y/o male with a history of  long segment Barrett's esophagus and personal history of colon polyps last EGD and colonoscopy were in May 2023 surveillance is due in May 2026 there was no dysplasia.  Plan  EGD+colonoscopy in 08/2024 for surveillance due to prior history of colon polyps and non dysplastic long segment barrettes esophagus.  Continue lifestyle measures and PPI for Barrett's esophagus no other acute changes today  Dr Luke Salaam  MD,MRCP [facility]) Follow up in 3  months

## 2023-08-23 ENCOUNTER — Other Ambulatory Visit: Payer: Self-pay | Admitting: Nurse Practitioner

## 2023-08-24 ENCOUNTER — Ambulatory Visit (INDEPENDENT_AMBULATORY_CARE_PROVIDER_SITE_OTHER): Payer: 59 | Admitting: Nurse Practitioner

## 2023-08-24 ENCOUNTER — Encounter: Payer: Self-pay | Admitting: Nurse Practitioner

## 2023-08-24 VITALS — BP 111/74 | HR 76 | Temp 98.4°F | Resp 17 | Ht 70.5 in | Wt 187.8 lb

## 2023-08-24 DIAGNOSIS — F200 Paranoid schizophrenia: Secondary | ICD-10-CM

## 2023-08-24 DIAGNOSIS — K2270 Barrett's esophagus without dysplasia: Secondary | ICD-10-CM

## 2023-08-24 MED ORDER — MECLIZINE HCL 25 MG PO TABS: ORAL_TABLET | ORAL | 1 refills | Status: DC

## 2023-08-24 MED ORDER — LORAZEPAM 0.5 MG PO TABS: ORAL_TABLET | ORAL | 2 refills | Status: DC

## 2023-08-24 MED ORDER — EMPAGLIFLOZIN 10 MG PO TABS: 10.0000 mg | ORAL_TABLET | Freq: Every day | ORAL | 1 refills | Status: DC

## 2023-08-24 NOTE — Assessment & Plan Note (Signed)
Chronic. Controlled without medication.  Patient does take Lorazepam at bedtime to help with sleep and his mood.  Working well for him.  Discussion had with him and his sister, Darlyn Read, who is his legal guardian the risks involved taking long term Benzodiazepines. Okay with risks and would like to continue.  Patient has been taking medication for many years.  Follow up in 3 months.  Call sooner if concerns arise.

## 2023-08-24 NOTE — Assessment & Plan Note (Signed)
 Followed by GI.  Recent note reviewed.  Will have updated EGD next year.  Continue with Pantoprazole  40mg  BID.

## 2023-08-24 NOTE — Progress Notes (Signed)
 BP 111/74 (BP Location: Right Arm, Patient Position: Sitting, Cuff Size: Normal)   Pulse 76   Temp 98.4 F (36.9 C) (Oral)   Resp 17   Ht 5' 10.5" (1.791 m)   Wt 187 lb 12.8 oz (85.2 kg)   SpO2 90%   BMI 26.57 kg/m    Subjective:    Patient ID: George Glenn, male    DOB: August 17, 1960, 63 y.o.   MRN: [id number]  HPI: George Glenn is a 63 y.o. male  Chief Complaint  Patient presents with   GI Problem    The same. Unable to do EGD and possibly could repeat next year.    Hypertension    Random checks at home mostly when he is not feeling well.     INSOMNIA/MOOD Doing well with lorazepam . No side effects.  Denies concerns at visit today.  His sister takes care of him.  He does take a lot of naps.    GERD Patient went to see GI.  He didn't get an EGD. Not due until next year.  His symptoms improved since increasing the pantoprazole  to 40mg  BID.   Relevant past medical, surgical, family and social history reviewed and updated as indicated. Interim medical history since our last visit reviewed. Allergies and medications reviewed and updated.  Review of Systems  Eyes:  Negative for visual disturbance.  Respiratory:  Negative for chest tightness and shortness of breath.   Cardiovascular:  Negative for chest pain, palpitations and leg swelling.  Gastrointestinal:  Negative for abdominal pain.  Neurological:  Negative for dizziness, light-headedness and headaches.    Per HPI unless specifically indicated above     Objective:    BP 111/74 (BP Location: Right Arm, Patient Position: Sitting, Cuff Size: Normal)   Pulse 76   Temp 98.4 F (36.9 C) (Oral)   Resp 17   Ht 5' 10.5" (1.791 m)   Wt 187 lb 12.8 oz (85.2 kg)   SpO2 90%   BMI 26.57 kg/m   Wt Readings from Last 3 Encounters:  08/24/23 187 lb 12.8 oz (85.2 kg)  08/15/23 185 lb 6.4 oz (84.1 kg)  05/25/23 183 lb 9.6 oz (83.3 kg)    Physical Exam Vitals and nursing note reviewed.  Constitutional:       General: He is not in acute distress.    Appearance: Normal appearance. He is not ill-appearing, toxic-appearing or diaphoretic.  HENT:     Head: Normocephalic.     Right Ear: External ear normal.     Left Ear: External ear normal.     Nose: Nose normal. No congestion or rhinorrhea.     Mouth/Throat:     Mouth: Mucous membranes are moist.  Eyes:     General:        Right eye: No discharge.        Left eye: No discharge.     Extraocular Movements: Extraocular movements intact.     Conjunctiva/sclera: Conjunctivae normal.     Pupils: Pupils are equal, round, and reactive to light.  Cardiovascular:     Rate and Rhythm: Normal rate and regular rhythm.     Heart sounds: No murmur heard. Pulmonary:     Effort: Pulmonary effort is normal. No respiratory distress.     Breath sounds: Normal breath sounds. No wheezing, rhonchi or rales.  Abdominal:     General: Abdomen is flat. Bowel sounds are normal. There is no distension.     Palpations: There is no mass.  Tenderness: There is no abdominal tenderness. There is no right CVA tenderness, left CVA tenderness, guarding or rebound.     Hernia: No hernia is present.  Musculoskeletal:     Cervical back: Normal range of motion and neck supple.  Skin:    General: Skin is warm and dry.     Capillary Refill: Capillary refill takes less than 2 seconds.  Neurological:     General: No focal deficit present.     Mental Status: He is alert and oriented to person, place, and time.  Psychiatric:        Mood and Affect: Mood normal.        Behavior: Behavior normal.        Thought Content: Thought content normal.        Judgment: Judgment normal.     Results for orders placed or performed in visit on 05/25/23  Microalbumin, Urine Waived   Collection Time: 05/25/23  2:16 PM  Result Value Ref Range   Microalb, Ur Waived 80 (H) 0 - 19 mg/L   Creatinine, Urine Waived 300 10 - 300 mg/dL   Microalb/Creat Ratio 30-300 (H) <30 mg/g  Comp Met  (CMET)   Collection Time: 05/25/23  2:17 PM  Result Value Ref Range   Glucose 78 70 - 99 mg/dL   BUN 12 8 - 27 mg/dL   Creatinine, Ser 4.09 0.76 - 1.27 mg/dL   eGFR 811 >91 YN/WGN/5.62   BUN/Creatinine Ratio 15 10 - 24   Sodium 141 134 - 144 mmol/L   Potassium 4.7 3.5 - 5.2 mmol/L   Chloride 102 96 - 106 mmol/L   CO2 24 20 - 29 mmol/L   Calcium 8.9 8.6 - 10.2 mg/dL   Total Protein 7.5 6.0 - 8.5 g/dL   Albumin 4.4 3.9 - 4.9 g/dL   Globulin, Total 3.1 1.5 - 4.5 g/dL   Bilirubin Total 0.6 0.0 - 1.2 mg/dL   Alkaline Phosphatase 96 44 - 121 IU/L   AST 22 0 - 40 IU/L   ALT 26 0 - 44 IU/L  CBC w/Diff   Collection Time: 05/25/23  2:17 PM  Result Value Ref Range   WBC 4.8 3.4 - 10.8 x10E3/uL   RBC 5.71 4.14 - 5.80 x10E6/uL   Hemoglobin 16.7 13.0 - 17.7 g/dL   Hematocrit 13.0 86.5 - 51.0 %   MCV 88 79 - 97 fL   MCH 29.2 26.6 - 33.0 pg   MCHC 33.3 31.5 - 35.7 g/dL   RDW 78.4 69.6 - 29.5 %   Platelets 234 150 - 450 x10E3/uL   Neutrophils 46 Not Estab. %   Lymphs 40 Not Estab. %   Monocytes 10 Not Estab. %   Eos 3 Not Estab. %   Basos 1 Not Estab. %   Neutrophils Absolute 2.2 1.4 - 7.0 x10E3/uL   Lymphocytes Absolute 1.9 0.7 - 3.1 x10E3/uL   Monocytes Absolute 0.5 0.1 - 0.9 x10E3/uL   EOS (ABSOLUTE) 0.1 0.0 - 0.4 x10E3/uL   Basophils Absolute 0.0 0.0 - 0.2 x10E3/uL   Immature Granulocytes 0 Not Estab. %   Immature Grans (Abs) 0.0 0.0 - 0.1 x10E3/uL  HgB A1c   Collection Time: 05/25/23  2:17 PM  Result Value Ref Range   Hgb A1c MFr Bld 6.1 (H) 4.8 - 5.6 %   Est. average glucose Bld gHb Est-mCnc 128 mg/dL      Assessment & Plan:   Problem List Items Addressed This Visit  Digestive   Barrett's esophagus   Followed by GI.  Recent note reviewed.  Will have updated EGD next year.  Continue with Pantoprazole  40mg  BID.          Other   Paranoid schizophrenia (HCC) - Primary   Chronic. Controlled without medication.  Patient does take Lorazepam  at bedtime to help with  sleep and his mood.  Working well for him.  Discussion had with him and his sister, Verlinda Gloss, who is his legal guardian the risks involved taking long term Benzodiazepines. Okay with risks and would like to continue.  Patient has been taking medication for many years.  Follow up in 3 months.  Call sooner if concerns arise.          Follow up plan: Return in about 3 months (around 11/24/2023) for HTN, HLD, DM2 FU.

## 2023-08-25 ENCOUNTER — Telehealth: Payer: Self-pay

## 2023-08-25 MED ORDER — MECLIZINE HCL 25 MG PO TABS: ORAL_TABLET | ORAL | 1 refills | Status: DC

## 2023-08-25 NOTE — Telephone Encounter (Signed)
 Directions revised.

## 2023-08-25 NOTE — Telephone Encounter (Signed)
 Pharmacy sent a fax requesting clarification on the Meclizine  prescription. They need a new RX sent with more specific directions, day supply limitations specifically.

## 2023-11-21 ENCOUNTER — Other Ambulatory Visit: Payer: Self-pay | Admitting: Nurse Practitioner

## 2023-11-22 NOTE — Telephone Encounter (Signed)
 Requested Prescriptions  Pending Prescriptions Disp Refills   omeprazole  (PRILOSEC) 40 MG capsule [Pharmacy Med Name: OMEPRAZOLE  40MG  CAPSULES] 90 capsule     Sig: TAKE 1 CAPSULE(40 MG) BY MOUTH DAILY     Gastroenterology: Proton Pump Inhibitors Passed - 11/22/2023 11:21 AM      Passed - Valid encounter within last 12 months    Recent Outpatient Visits           3 months ago Paranoid schizophrenia (HCC)   [place] [facility] Melvin Pao, NP   6 months ago Paranoid schizophrenia [facility])   [place] [facility] Melvin Pao, NP               lisinopril  (ZESTRIL ) 10 MG tablet [Pharmacy Med Name: LISINOPRIL  10MG  TABLETS] 90 tablet     Sig: TAKE 1 TABLET(10 MG) BY MOUTH DAILY     Cardiovascular:  ACE Inhibitors Failed - 11/22/2023 11:21 AM      Failed - Cr in normal range and within 180 days    Creatinine, Ser  Date Value Ref Range Status  05/25/2023 0.78 0.76 - 1.27 mg/dL Final         Failed - K in normal range and within 180 days    Potassium  Date Value Ref Range Status  05/25/2023 4.7 3.5 - 5.2 mmol/L Final         Passed - Patient is not pregnant      Passed - Last BP in normal range    BP Readings from Last 1 Encounters:  08/24/23 111/74         Passed - Valid encounter within last 6 months    Recent Outpatient Visits           3 months ago Paranoid schizophrenia [facility])   [place] [facility] Melvin Pao, NP   6 months ago Paranoid schizophrenia [facility])   [place] [facility] Melvin Pao, NP

## 2023-11-24 ENCOUNTER — Encounter: Payer: Self-pay | Admitting: Nurse Practitioner

## 2023-11-24 ENCOUNTER — Ambulatory Visit (INDEPENDENT_AMBULATORY_CARE_PROVIDER_SITE_OTHER): Admitting: Nurse Practitioner

## 2023-11-24 VITALS — BP 115/82 | HR 80 | Temp 97.9°F | Ht 70.5 in | Wt 189.6 lb

## 2023-11-24 DIAGNOSIS — R7303 Prediabetes: Secondary | ICD-10-CM

## 2023-11-24 DIAGNOSIS — F200 Paranoid schizophrenia: Secondary | ICD-10-CM

## 2023-11-24 DIAGNOSIS — N181 Chronic kidney disease, stage 1: Secondary | ICD-10-CM | POA: Diagnosis not present

## 2023-11-24 DIAGNOSIS — D649 Anemia, unspecified: Secondary | ICD-10-CM | POA: Diagnosis not present

## 2023-11-24 DIAGNOSIS — E782 Mixed hyperlipidemia: Secondary | ICD-10-CM

## 2023-11-24 DIAGNOSIS — I10 Essential (primary) hypertension: Secondary | ICD-10-CM | POA: Diagnosis not present

## 2023-11-24 MED ORDER — SIMVASTATIN 10 MG PO TABS
ORAL_TABLET | ORAL | 2 refills | Status: DC
Start: 2023-11-24 — End: 2024-02-22

## 2023-11-24 MED ORDER — EMPAGLIFLOZIN 10 MG PO TABS: 10.0000 mg | ORAL_TABLET | Freq: Every day | ORAL | 1 refills | Status: DC

## 2023-11-24 MED ORDER — TRIAMCINOLONE ACETONIDE 0.1 % EX CREA: 1.0000 | TOPICAL_CREAM | Freq: Two times a day (BID) | CUTANEOUS | 0 refills | Status: AC

## 2023-11-24 MED ORDER — MECLIZINE HCL 25 MG PO TABS: ORAL_TABLET | ORAL | 1 refills | Status: DC

## 2023-11-24 MED ORDER — LORAZEPAM 0.5 MG PO TABS: ORAL_TABLET | ORAL | 2 refills | Status: DC

## 2023-11-24 MED ORDER — LISINOPRIL 10 MG PO TABS: 10.0000 mg | ORAL_TABLET | Freq: Every day | ORAL | 1 refills | Status: DC

## 2023-11-24 MED ORDER — PANTOPRAZOLE SODIUM 40 MG PO TBEC: 40.0000 mg | DELAYED_RELEASE_TABLET | Freq: Two times a day (BID) | ORAL | 1 refills | Status: DC

## 2023-11-24 NOTE — Assessment & Plan Note (Signed)
 Labs ordered at visit today.  Will make recommendations based on lab results.

## 2023-11-24 NOTE — Assessment & Plan Note (Signed)
 Chronic. Controlled without medication.  Patient does take Lorazepam  at bedtime to help with sleep and his mood.  Working well for him.  Discussion had with him and his sister, Roderick Derby, who is his legal guardian the risks involved taking long term Benzodiazepines. Okay with risks and would like to continue.  Patient has been taking medication for many years.  Follow up in 3 months.  Call sooner if concerns arise.  PDMP checked.

## 2023-11-24 NOTE — Assessment & Plan Note (Signed)
 Chronic.  Controlled.  Continue with current medication regimen.  Labs ordered today.  Return to clinic in 6 months for reevaluation.  Call sooner if concerns arise.  ? ?

## 2023-11-24 NOTE — Assessment & Plan Note (Signed)
 Chronic. Controlled.  Continue Lisinopril  10mg  daily.   Follow up in 3 month for reevaluation.  Labs ordered today.

## 2023-11-24 NOTE — Progress Notes (Signed)
 BP 115/82   Pulse 80   Temp 97.9 F (36.6 C) (Oral)   Ht 5' 10.5 (1.791 m)   Wt 189 lb 9.6 oz (86 kg)   SpO2 93%   BMI 26.82 kg/m    Subjective:    Patient ID: George Glenn, male    DOB: [birth year], 63 y.o.   MRN: [id number]  HPI: George Glenn is a 63 y.o. male  Chief Complaint  Patient presents with   Hyperlipidemia   Hypertension   Diabetes   HYPERLIPIDEMIA Hyperlipidemia status: chronic Satisfied with current treatment?  yes Side effects:  no Medication compliance: excellent compliance Past cholesterol meds: simvastatin  (zocor ) Supplements: none Aspirin:  no The 10-year ASCVD risk score (Arnett DK, et al., 2019) is: 19.8%   Values used to calculate the score:     Age: 67 years     Sex: Male     Is Non-Hispanic African American: No     Diabetic: Yes     Tobacco smoker: No     Systolic Blood Pressure: 108 mmHg     Is BP treated: Yes     HDL Cholesterol: 25 mg/dL     Total Cholesterol: 134 mg/dL Chest pain:  no  ANEMIA Anemia status: controlled Etiology of anemia: Duration of anemia treatment:  Compliance with treatment: excellent compliance Iron supplementation side effects: no Severity of anemia: mild Fatigue: no Decreased exercise tolerance: no  Dyspnea on exertion: no Palpitations: no Bleeding: no Pica: no  INSOMNIA/MOOD Doing well with lorazepam . No side effects.  Denies concerns at visit today.    CHRONIC KIDNEY DISEASE CKD status: stable Medications renally dose: yes Previous renal evaluation: no Pneumovax:  Up to Date Influenza Vaccine:  Up to Date  Patient is going on a cruise next month.  Relevant past medical, surgical, family and social history reviewed and updated as indicated. Interim medical history since our last visit reviewed. Allergies and medications reviewed and updated.  Review of Systems  Eyes:  Negative for visual disturbance.  Respiratory:  Negative for chest tightness and shortness of breath.    Cardiovascular:  Negative for chest pain, palpitations and leg swelling.  Neurological:  Negative for dizziness, light-headedness and headaches.    Per HPI unless specifically indicated above     Objective:    BP 115/82   Pulse 80   Temp 97.9 F (36.6 C) (Oral)   Ht 5' 10.5 (1.791 m)   Wt 189 lb 9.6 oz (86 kg)   SpO2 93%   BMI 26.82 kg/m   Wt Readings from Last 3 Encounters:  11/24/23 189 lb 9.6 oz (86 kg)  08/24/23 187 lb 12.8 oz (85.2 kg)  08/15/23 185 lb 6.4 oz (84.1 kg)    Physical Exam Vitals and nursing note reviewed.  Constitutional:      General: He is not in acute distress.    Appearance: Normal appearance. He is not ill-appearing, toxic-appearing or diaphoretic.  HENT:     Head: Normocephalic.     Right Ear: External ear normal.     Left Ear: External ear normal.     Nose: Nose normal. No congestion or rhinorrhea.     Mouth/Throat:     Mouth: Mucous membranes are moist.  Eyes:     General:        Right eye: No discharge.        Left eye: No discharge.     Extraocular Movements: Extraocular movements intact.     Conjunctiva/sclera: Conjunctivae normal.  Pupils: Pupils are equal, round, and reactive to light.  Cardiovascular:     Rate and Rhythm: Normal rate and regular rhythm.     Heart sounds: No murmur heard. Pulmonary:     Effort: Pulmonary effort is normal. No respiratory distress.     Breath sounds: Normal breath sounds. No wheezing, rhonchi or rales.  Abdominal:     General: Abdomen is flat. Bowel sounds are normal. There is no distension.     Palpations: There is no mass.     Tenderness: There is no abdominal tenderness. There is no right CVA tenderness, left CVA tenderness, guarding or rebound.     Hernia: No hernia is present.  Musculoskeletal:     Cervical back: Normal range of motion and neck supple.  Skin:    General: Skin is warm and dry.     Capillary Refill: Capillary refill takes less than 2 seconds.  Neurological:     General:  No focal deficit present.     Mental Status: He is alert and oriented to person, place, and time.  Psychiatric:        Mood and Affect: Mood normal.        Behavior: Behavior normal.        Thought Content: Thought content normal.        Judgment: Judgment normal.     Results for orders placed or performed in visit on 05/25/23  Microalbumin, Urine Waived   Collection Time: 05/25/23  2:16 PM  Result Value Ref Range   Microalb, Ur Waived 80 (H) 0 - 19 mg/L   Creatinine, Urine Waived 300 10 - 300 mg/dL   Microalb/Creat Ratio 30-300 (H) <30 mg/g  Comp Met (CMET)   Collection Time: 05/25/23  2:17 PM  Result Value Ref Range   Glucose 78 70 - 99 mg/dL   BUN 12 8 - 27 mg/dL   Creatinine, Ser 9.21 0.76 - 1.27 mg/dL   eGFR 899 >40 fO/fpw/8.26   BUN/Creatinine Ratio 15 10 - 24   Sodium 141 134 - 144 mmol/L   Potassium 4.7 3.5 - 5.2 mmol/L   Chloride 102 96 - 106 mmol/L   CO2 24 20 - 29 mmol/L   Calcium 8.9 8.6 - 10.2 mg/dL   Total Protein 7.5 6.0 - 8.5 g/dL   Albumin 4.4 3.9 - 4.9 g/dL   Globulin, Total 3.1 1.5 - 4.5 g/dL   Bilirubin Total 0.6 0.0 - 1.2 mg/dL   Alkaline Phosphatase 96 44 - 121 IU/L   AST 22 0 - 40 IU/L   ALT 26 0 - 44 IU/L  CBC w/Diff   Collection Time: 05/25/23  2:17 PM  Result Value Ref Range   WBC 4.8 3.4 - 10.8 x10E3/uL   RBC 5.71 4.14 - 5.80 x10E6/uL   Hemoglobin 16.7 13.0 - 17.7 g/dL   Hematocrit 49.7 62.4 - 51.0 %   MCV 88 79 - 97 fL   MCH 29.2 26.6 - 33.0 pg   MCHC 33.3 31.5 - 35.7 g/dL   RDW 86.7 88.3 - 84.5 %   Platelets 234 150 - 450 x10E3/uL   Neutrophils 46 Not Estab. %   Lymphs 40 Not Estab. %   Monocytes 10 Not Estab. %   Eos 3 Not Estab. %   Basos 1 Not Estab. %   Neutrophils Absolute 2.2 1.4 - 7.0 x10E3/uL   Lymphocytes Absolute 1.9 0.7 - 3.1 x10E3/uL   Monocytes Absolute 0.5 0.1 - 0.9 x10E3/uL   EOS (ABSOLUTE)  0.1 0.0 - 0.4 x10E3/uL   Basophils Absolute 0.0 0.0 - 0.2 x10E3/uL   Immature Granulocytes 0 Not Estab. %   Immature Grans  (Abs) 0.0 0.0 - 0.1 x10E3/uL  HgB A1c   Collection Time: 05/25/23  2:17 PM  Result Value Ref Range   Hgb A1c MFr Bld 6.1 (H) 4.8 - 5.6 %   Est. average glucose Bld gHb Est-mCnc 128 mg/dL      Assessment & Plan:   Problem List Items Addressed This Visit       Cardiovascular and Mediastinum   Hypertension - Primary   Chronic. Controlled.  Continue Lisinopril  10mg  daily.   Follow up in 3 month for reevaluation.  Labs ordered today.        Relevant Medications   simvastatin  (ZOCOR ) 10 MG tablet   lisinopril  (ZESTRIL ) 10 MG tablet   Other Relevant Orders   Comprehensive metabolic panel with GFR     Genitourinary   CKD (chronic kidney disease)   Labs ordered at visit today.  Will make recommendations based on lab results.          Other   Paranoid schizophrenia (HCC)   Chronic. Controlled without medication.  Patient does take Lorazepam  at bedtime to help with sleep and his mood.  Working well for him.  Discussion had with him and his sister, Roderick Derby, who is his legal guardian the risks involved taking long term Benzodiazepines. Okay with risks and would like to continue.  Patient has been taking medication for many years.  Follow up in 3 months.  Call sooner if concerns arise.  PDMP checked.       Anemia   Chronic.  Controlled.  Continue with current medication regimen.  Labs ordered today.  Return to clinic in 6 months for reevaluation.  Call sooner if concerns arise.       Relevant Orders   CBC With Diff/Platelet   Hyperlipidemia   Relevant Medications   simvastatin  (ZOCOR ) 10 MG tablet   lisinopril  (ZESTRIL ) 10 MG tablet   Other Relevant Orders   Lipid panel   Prediabetes   Labs ordered at visit today.  Will make recommendations based on lab results.        Relevant Orders   Hemoglobin A1c      Follow up plan: No follow-ups on file.

## 2023-11-25 ENCOUNTER — Ambulatory Visit: Payer: Self-pay | Admitting: Nurse Practitioner

## 2023-11-25 LAB — COMPREHENSIVE METABOLIC PANEL WITH GFR
ALT: 31 IU/L (ref 0–44)
AST: 26 IU/L (ref 0–40)
Albumin: 4.7 g/dL (ref 3.9–4.9)
Alkaline Phosphatase: 83 IU/L (ref 44–121)
BUN/Creatinine Ratio: 23 (ref 10–24)
BUN: 20 mg/dL (ref 8–27)
Bilirubin Total: 0.9 mg/dL (ref 0.0–1.2)
CO2: 18 mmol/L — ABNORMAL LOW (ref 20–29)
Calcium: 9.4 mg/dL (ref 8.6–10.2)
Chloride: 103 mmol/L (ref 96–106)
Creatinine, Ser: 0.87 mg/dL (ref 0.76–1.27)
Globulin, Total: 2.6 g/dL (ref 1.5–4.5)
Glucose: 98 mg/dL (ref 70–99)
Potassium: 4.2 mmol/L (ref 3.5–5.2)
Sodium: 140 mmol/L (ref 134–144)
Total Protein: 7.3 g/dL (ref 6.0–8.5)
eGFR: 97 mL/min/1.73 (ref >59)

## 2023-11-25 LAB — CBC WITH DIFF/PLATELET
Basophils Absolute: 0 x10E3/uL (ref 0.0–0.2)
Basos: 1 %
EOS (ABSOLUTE): 0.1 x10E3/uL (ref 0.0–0.4)
Eos: 2 %
Hematocrit: 53.3 % — ABNORMAL HIGH (ref 37.5–51.0)
Hemoglobin: 16.8 g/dL (ref 13.0–17.7)
Immature Grans (Abs): 0 x10E3/uL (ref 0.0–0.1)
Immature Granulocytes: 0 %
Lymphocytes Absolute: 1.9 x10E3/uL (ref 0.7–3.1)
Lymphs: 35 %
MCH: 28.7 pg (ref 26.6–33.0)
MCHC: 31.5 g/dL (ref 31.5–35.7)
MCV: 91 fL (ref 79–97)
Monocytes Absolute: 0.5 x10E3/uL (ref 0.1–0.9)
Monocytes: 9 %
Neutrophils Absolute: 2.9 x10E3/uL (ref 1.4–7.0)
Neutrophils: 53 %
Platelets: 187 x10E3/uL (ref 150–450)
RBC: 5.85 x10E6/uL — ABNORMAL HIGH (ref 4.14–5.80)
RDW: 14.3 % (ref 11.6–15.4)
WBC: 5.4 x10E3/uL (ref 3.4–10.8)

## 2023-11-25 LAB — HEMOGLOBIN A1C
Est. average glucose Bld gHb Est-mCnc: 148 mg/dL
Hgb A1c MFr Bld: 6.8 % — ABNORMAL HIGH (ref 4.8–5.6)

## 2023-11-25 LAB — LIPID PANEL
Chol/HDL Ratio: 6.2 ratio — ABNORMAL HIGH (ref 0.0–5.0)
Cholesterol, Total: 142 mg/dL (ref 100–199)
HDL: 23 mg/dL — ABNORMAL LOW (ref >39)
LDL Chol Calc (NIH): 99 mg/dL (ref 0–99)
Triglycerides: 106 mg/dL (ref 0–149)
VLDL Cholesterol Cal: 20 mg/dL (ref 5–40)

## 2023-12-05 DIAGNOSIS — E1140 Type 2 diabetes mellitus with diabetic neuropathy, unspecified: Secondary | ICD-10-CM | POA: Diagnosis not present

## 2023-12-05 DIAGNOSIS — L600 Ingrowing nail: Secondary | ICD-10-CM | POA: Diagnosis not present

## 2023-12-05 DIAGNOSIS — B351 Tinea unguium: Secondary | ICD-10-CM | POA: Diagnosis not present

## 2024-01-25 ENCOUNTER — Ambulatory Visit: Payer: Self-pay

## 2024-01-25 NOTE — Telephone Encounter (Signed)
 FYI Only or Action Required?: Action required by provider: request for appointment.  Patient was last seen in primary care on 11/24/2023 by Melvin Pao, NP.  Called Nurse Triage reporting Abdominal Pain.  Symptoms began several months ago.  Interventions attempted: Rest, hydration, or home remedies.  Symptoms are: gradually worsening.  Triage Disposition: See PCP Within 2 Weeks  Patient/caregiver understands and will follow disposition?: YesCopied from [employer] #[id number]. Topic: Clinical - Red Word Triage >> Jan 25, 2024  8:56 AM Roselie BROCKS wrote: [facility] that prompted transfer to Nurse Triage: Patients sister Harlie Mcbride) states the patient is having a lot of pain in his stomach, and sleeping a lot and staying fatigued all the time Reason for Disposition  Abdominal pain is a chronic symptom (recurrent or ongoing AND present > 4 weeks)  Answer Assessment - Initial Assessment Questions Sister Roderick called. He wont eat for hours then will eat a lot hours later. Some days I can't fill him up and some days he wont eat. He is diabetic, but we don't have a way to check his blood sugar. I don't buy junk food/milk. I monitor him closely.  He feels nauseated at times. I'm not sure is this drama/attention seeking. 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 He sleeps so much now.       1. LOCATION: Where does it hurt?      Middle of stomach  2. RADIATION: Does the pain shoot anywhere else? (e.g., chest, back)     denies 3. ONSET: When did the pain begin? (Minutes, hours or days ago)      Several months 4. SUDDEN: Gradual or sudden onset?     na 5. PATTERN Does the pain come and go, or is it constant?     Comes and goes 6. SEVERITY: How bad is the pain?  (e.g., Scale 1-10; mild, moderate, or severe)     moderate 7. RECURRENT SYMPTOM: Have you ever had this type of stomach pain before? If Yes, ask: When was the last time? and What happened that time?      Not sure 8. CAUSE: What do  you think is causing the stomach pain? (e.g., gallstones, recent abdominal surgery)     Not sure 9. RELIEVING/AGGRAVATING FACTORS: What makes it better or worse? (e.g., antacids, bending or twisting motion, bowel movement)     Eating  10. OTHER SYMPTOMS: Do you have any other symptoms? (e.g., back pain, diarrhea, fever, urination pain, vomiting)       Nauseated  Protocols used: Abdominal Pain - Male-A-AH

## 2024-01-27 ENCOUNTER — Encounter: Payer: Self-pay | Admitting: Family Medicine

## 2024-01-27 ENCOUNTER — Ambulatory Visit: Admitting: Family Medicine

## 2024-01-27 VITALS — BP 133/86 | HR 58 | Temp 97.9°F | Resp 15 | Ht 70.51 in | Wt 190.0 lb

## 2024-01-27 DIAGNOSIS — Z23 Encounter for immunization: Secondary | ICD-10-CM

## 2024-01-27 DIAGNOSIS — K219 Gastro-esophageal reflux disease without esophagitis: Secondary | ICD-10-CM | POA: Diagnosis not present

## 2024-01-27 DIAGNOSIS — E118 Type 2 diabetes mellitus with unspecified complications: Secondary | ICD-10-CM | POA: Insufficient documentation

## 2024-01-27 MED ORDER — ONDANSETRON 4 MG PO TBDP: 4.0000 mg | ORAL_TABLET | Freq: Three times a day (TID) | ORAL | 1 refills | Status: AC | PRN

## 2024-01-27 MED ORDER — SUCRALFATE 1 G PO TABS: 1.0000 g | ORAL_TABLET | Freq: Three times a day (TID) | ORAL | 1 refills | Status: AC

## 2024-01-27 NOTE — Progress Notes (Signed)
 BP 133/86 (BP Location: Left Arm, Patient Position: Sitting, Cuff Size: Normal)   Pulse (!) 58   Temp 97.9 F (36.6 C) (Oral)   Resp 15   Ht 5' 10.51 (1.791 m)   Wt 190 lb (86.2 kg)   SpO2 98%   BMI 26.87 kg/m    Subjective:    Patient ID: George Glenn, male    DOB: 1961-01-25, 63 y.o.   MRN: [id number]  HPI: George Glenn is a 63 y.o. male  Chief Complaint  Patient presents with   Abdominal Pain    For awhile bothering now. Hurts right in the middle and feels like he might throw up.    ABDOMINAL PAIN- has been following with Dr. Therisa for years and had EGD in April.   Duration: at least 6 months Onset: gradual Severity: severe Quality: nauseous and aching Location:  epigastric  Episode duration: all day Radiation: no Frequency: most of the time Alleviating factors: nothing Aggravating factors: eating Status: worse Treatments attempted: pantoprazole  Fever: no Nausea: yes Vomiting: no Weight loss: no Decreased appetite: yes Diarrhea: no Constipation: no Blood in stool: no Heartburn: yes Jaundice: no Rash: no Dysuria/urinary frequency: no Hematuria: no History of sexually transmitted disease: no Recurrent NSAID use: no  Relevant past medical, surgical, family and social history reviewed and updated as indicated. Interim medical history since our last visit reviewed. Allergies and medications reviewed and updated.  Review of Systems  Constitutional:  Positive for fatigue. Negative for activity change, appetite change, chills, diaphoresis, fever and unexpected weight change.  Respiratory: Negative.    Cardiovascular: Negative.   Gastrointestinal:  Positive for abdominal pain and nausea. Negative for abdominal distention, anal bleeding, blood in stool, constipation, diarrhea, rectal pain and vomiting.  Musculoskeletal: Negative.   Psychiatric/Behavioral: Negative.      Per HPI unless specifically indicated above     Objective:    BP 133/86 (BP  Location: Left Arm, Patient Position: Sitting, Cuff Size: Normal)   Pulse (!) 58   Temp 97.9 F (36.6 C) (Oral)   Resp 15   Ht 5' 10.51 (1.791 m)   Wt 190 lb (86.2 kg)   SpO2 98%   BMI 26.87 kg/m   Wt Readings from Last 3 Encounters:  01/27/24 190 lb (86.2 kg)  11/24/23 189 lb 9.6 oz (86 kg)  08/24/23 187 lb 12.8 oz (85.2 kg)    Physical Exam Vitals and nursing note reviewed.  Constitutional:      General: He is not in acute distress.    Appearance: Normal appearance. He is not ill-appearing, toxic-appearing or diaphoretic.  HENT:     Head: Normocephalic and atraumatic.     Right Ear: External ear normal.     Left Ear: External ear normal.     Nose: Nose normal.     Mouth/Throat:     Mouth: Mucous membranes are moist.     Pharynx: Oropharynx is clear.  Eyes:     General: No scleral icterus.       Right eye: No discharge.        Left eye: No discharge.     Extraocular Movements: Extraocular movements intact.     Conjunctiva/sclera: Conjunctivae normal.     Pupils: Pupils are equal, round, and reactive to light.  Cardiovascular:     Rate and Rhythm: Normal rate and regular rhythm.     Pulses: Normal pulses.     Heart sounds: Normal heart sounds. No murmur heard.    No friction  rub. No gallop.  Pulmonary:     Effort: Pulmonary effort is normal. No respiratory distress.     Breath sounds: Normal breath sounds. No stridor. No wheezing, rhonchi or rales.  Chest:     Chest wall: No tenderness.  Musculoskeletal:        General: Normal range of motion.     Cervical back: Normal range of motion and neck supple.  Skin:    General: Skin is warm and dry.     Capillary Refill: Capillary refill takes less than 2 seconds.     Coloration: Skin is not jaundiced or pale.     Findings: No bruising, erythema, lesion or rash.  Neurological:     General: No focal deficit present.     Mental Status: He is alert and oriented to person, place, and time. Mental status is at baseline.   Psychiatric:        Mood and Affect: Mood normal.        Behavior: Behavior normal.        Thought Content: Thought content normal.        Judgment: Judgment normal.     Results for orders placed or performed in visit on 11/24/23  Comprehensive metabolic panel with GFR   Collection Time: 11/24/23 11:13 AM  Result Value Ref Range   Glucose 98 70 - 99 mg/dL   BUN 20 8 - 27 mg/dL   Creatinine, Ser 9.12 0.76 - 1.27 mg/dL   eGFR 97 >40 fO/fpw/8.26   BUN/Creatinine Ratio 23 10 - 24   Sodium 140 134 - 144 mmol/L   Potassium 4.2 3.5 - 5.2 mmol/L   Chloride 103 96 - 106 mmol/L   CO2 18 (L) 20 - 29 mmol/L   Calcium 9.4 8.6 - 10.2 mg/dL   Total Protein 7.3 6.0 - 8.5 g/dL   Albumin 4.7 3.9 - 4.9 g/dL   Globulin, Total 2.6 1.5 - 4.5 g/dL   Bilirubin Total 0.9 0.0 - 1.2 mg/dL   Alkaline Phosphatase 83 44 - 121 IU/L   AST 26 0 - 40 IU/L   ALT 31 0 - 44 IU/L  Hemoglobin A1c   Collection Time: 11/24/23 11:13 AM  Result Value Ref Range   Hgb A1c MFr Bld 6.8 (H) 4.8 - 5.6 %   Est. average glucose Bld gHb Est-mCnc 148 mg/dL  Lipid panel   Collection Time: 11/24/23 11:13 AM  Result Value Ref Range   Cholesterol, Total 142 100 - 199 mg/dL   Triglycerides 893 0 - 149 mg/dL   HDL 23 (L) >60 mg/dL   VLDL Cholesterol Cal 20 5 - 40 mg/dL   LDL Chol Calc (NIH) 99 0 - 99 mg/dL   Chol/HDL Ratio 6.2 (H) 0.0 - 5.0 ratio  CBC With Diff/Platelet   Collection Time: 11/24/23 11:13 AM  Result Value Ref Range   WBC 5.4 3.4 - 10.8 x10E3/uL   RBC 5.85 (H) 4.14 - 5.80 x10E6/uL   Hemoglobin 16.8 13.0 - 17.7 g/dL   Hematocrit 46.6 (H) 62.4 - 51.0 %   MCV 91 79 - 97 fL   MCH 28.7 26.6 - 33.0 pg   MCHC 31.5 31.5 - 35.7 g/dL   RDW 85.6 88.3 - 84.5 %   Platelets 187 150 - 450 x10E3/uL   Neutrophils 53 Not Estab. %   Lymphs 35 Not Estab. %   Monocytes 9 Not Estab. %   Eos 2 Not Estab. %   Basos 1 Not Estab. %  Neutrophils Absolute 2.9 1.4 - 7.0 x10E3/uL   Lymphocytes Absolute 1.9 0.7 - 3.1 x10E3/uL    Monocytes Absolute 0.5 0.1 - 0.9 x10E3/uL   EOS (ABSOLUTE) 0.1 0.0 - 0.4 x10E3/uL   Basophils Absolute 0.0 0.0 - 0.2 x10E3/uL   Immature Granulocytes 0 Not Estab. %   Immature Grans (Abs) 0.0 0.0 - 0.1 x10E3/uL      Assessment & Plan:   Problem List Items Addressed This Visit       Digestive   GERD (gastroesophageal reflux disease) - Primary   Not doing well. Will add carafate and zofran. Continue pantoprazole . Follow up with PCP in 1 month for physical       Relevant Medications   sucralfate (CARAFATE) 1 g tablet   ondansetron (ZOFRAN-ODT) 4 MG disintegrating tablet   Other Visit Diagnoses       Need for diphtheria-tetanus-pertussis (Tdap) vaccine       Tdap given today.   Relevant Orders   Tdap vaccine greater than or equal to 7yo IM     Needs flu shot       Flu shot given today.   Relevant Orders   Flu vaccine trivalent PF, 6mos and older(Flulaval,Afluria,Fluarix,Fluzone)        Follow up plan: Return in about 26 days (around 02/22/2024), or with Darice, for physical.

## 2024-01-27 NOTE — Assessment & Plan Note (Signed)
 Not doing well. Will add carafate and zofran. Continue pantoprazole . Follow up with PCP in 1 month for physical

## 2024-02-02 ENCOUNTER — Other Ambulatory Visit: Payer: Self-pay | Admitting: Nurse Practitioner

## 2024-02-02 NOTE — Telephone Encounter (Signed)
 Copied from CRM #[id number]. Topic: Clinical - Medication Refill >> Feb 02, 2024 11:14 AM Myrick T wrote:  Patient is completely out of meds and last dosage taken is unknown  Medication: lisinopril  (ZESTRIL ) 10 MG tablet  Has the patient contacted their pharmacy? Yes No refills  This is the patient's preferred pharmacy:  [id number] - [place], [place] - [address] AT [facility] OF [address] [place] [address] [place] [address] Phone: [phone] Fax: [phone]  Is this the correct pharmacy for this prescription? Yes  Has the prescription been filled recently? Yes  Is the patient out of the medication? Yes  Has the patient been seen for an appointment in the last year OR does the patient have an upcoming appointment? Yes  Can we respond through MyChart? Yes  Agent: Please be advised that Rx refills may take up to 3 business days. We ask that you follow-up with your pharmacy.

## 2024-02-03 NOTE — Telephone Encounter (Signed)
 Too soon for refill  Requested Prescriptions  Pending Prescriptions Disp Refills   lisinopril  (ZESTRIL ) 10 MG tablet 90 tablet 1    Sig: Take 1 tablet (10 mg total) by mouth daily.     Cardiovascular:  ACE Inhibitors Passed - 02/03/2024  3:25 PM      Passed - Cr in normal range and within 180 days    Creatinine, Ser  Date Value Ref Range Status  11/24/2023 0.87 0.76 - 1.27 mg/dL Final         Passed - K in normal range and within 180 days    Potassium  Date Value Ref Range Status  11/24/2023 4.2 3.5 - 5.2 mmol/L Final         Passed - Patient is not pregnant      Passed - Last BP in normal range    BP Readings from Last 1 Encounters:  01/27/24 133/86         Passed - Valid encounter within last 6 months    Recent Outpatient Visits           1 week ago Gastroesophageal reflux disease, unspecified whether esophagitis present   [place] [facility], Megan P, DO   2 months ago Paranoid schizophrenia [facility])   [place] [facility] Melvin Pao, NP   5 months ago Paranoid schizophrenia [facility])   [place] [facility] Melvin Pao, NP   8 months ago Paranoid schizophrenia [facility])   [place] [facility] Melvin Pao, NP

## 2024-02-10 NOTE — Progress Notes (Unsigned)
   02/10/2024  Patient ID: George Glenn, male   DOB: [birth year], 63 y.o.   MRN: [id number]  This patient is appearing on a report for being at risk of failing the adherence measure for hypertension (ACEi/ARB) medications this calendar year.   Medication: lisinopril  10 mg tablets Last fill date: 02/06/24 for 90 day supply  Insurance report was not up to date. No action needed at this time.   Demetria Iwai C. Amogh [facility] PharmD Candidate Class of [phone]

## 2024-02-22 ENCOUNTER — Encounter: Payer: Self-pay | Admitting: Nurse Practitioner

## 2024-02-22 ENCOUNTER — Ambulatory Visit: Admitting: Nurse Practitioner

## 2024-02-22 VITALS — BP 117/79 | HR 56 | Temp 98.0°F | Ht 70.5 in | Wt 190.6 lb

## 2024-02-22 DIAGNOSIS — Z7189 Other specified counseling: Secondary | ICD-10-CM

## 2024-02-22 DIAGNOSIS — Z23 Encounter for immunization: Secondary | ICD-10-CM | POA: Diagnosis not present

## 2024-02-22 DIAGNOSIS — F200 Paranoid schizophrenia: Secondary | ICD-10-CM | POA: Diagnosis not present

## 2024-02-22 DIAGNOSIS — K219 Gastro-esophageal reflux disease without esophagitis: Secondary | ICD-10-CM | POA: Diagnosis not present

## 2024-02-22 DIAGNOSIS — Z7984 Long term (current) use of oral hypoglycemic drugs: Secondary | ICD-10-CM

## 2024-02-22 DIAGNOSIS — Z0000 Encounter for general adult medical examination without abnormal findings: Secondary | ICD-10-CM

## 2024-02-22 DIAGNOSIS — I10 Essential (primary) hypertension: Secondary | ICD-10-CM

## 2024-02-22 DIAGNOSIS — E782 Mixed hyperlipidemia: Secondary | ICD-10-CM

## 2024-02-22 DIAGNOSIS — D649 Anemia, unspecified: Secondary | ICD-10-CM

## 2024-02-22 DIAGNOSIS — N181 Chronic kidney disease, stage 1: Secondary | ICD-10-CM

## 2024-02-22 DIAGNOSIS — E1142 Type 2 diabetes mellitus with diabetic polyneuropathy: Secondary | ICD-10-CM

## 2024-02-22 MED ORDER — SIMVASTATIN 10 MG PO TABS: ORAL_TABLET | ORAL | 2 refills | Status: AC

## 2024-02-22 MED ORDER — LISINOPRIL 10 MG PO TABS: 10.0000 mg | ORAL_TABLET | Freq: Every day | ORAL | 1 refills | Status: AC

## 2024-02-22 MED ORDER — EMPAGLIFLOZIN 10 MG PO TABS: 10.0000 mg | ORAL_TABLET | Freq: Every day | ORAL | 1 refills | Status: AC

## 2024-02-22 MED ORDER — PANTOPRAZOLE SODIUM 40 MG PO TBEC: 40.0000 mg | DELAYED_RELEASE_TABLET | Freq: Two times a day (BID) | ORAL | 1 refills | Status: AC

## 2024-02-22 MED ORDER — MECLIZINE HCL 25 MG PO TABS: ORAL_TABLET | ORAL | 1 refills | Status: AC

## 2024-02-22 MED ORDER — LORAZEPAM 0.5 MG PO TABS: ORAL_TABLET | ORAL | 2 refills | Status: AC

## 2024-02-22 NOTE — Assessment & Plan Note (Signed)
Chronic.  Controlled.  Continue with current medication regimen of Simvastatin.  Labs ordered today.  Return to clinic in 3 months for reevaluation.  Call sooner if concerns arise.

## 2024-02-22 NOTE — Assessment & Plan Note (Addendum)
 Chronic. Improved.  Has not needed Zofran or Sucralfate since last visit.  He is due for his EGD for Barrett's esophagus.  Will place referral to make sure patient gets scheduled.

## 2024-02-22 NOTE — Assessment & Plan Note (Signed)
 Chronic. Controlled.  Continue Lisinopril  10mg  daily.   Follow up in 3 month for reevaluation.  Labs ordered today.

## 2024-02-22 NOTE — Assessment & Plan Note (Signed)
 A voluntary discussion about advance care planning including the explanation and discussion of advance directives was extensively discussed  with the patient for 5 minutes with patient and his sister, George Glenn present.  Explanation about the health care proxy and Living will was reviewed and packet with forms with explanation of how to fill them out was given.  During this discussion, the patient was able to identify a health care proxy as George Glenn and plans to fill out the paperwork required.  Patient was offered a separate Advance Care Planning visit for further assistance with forms.

## 2024-02-22 NOTE — Progress Notes (Signed)
 BP 117/79   Pulse (!) 56   Temp 98 F (36.7 C) (Oral)   Ht 5' 10.5 (1.791 m)   Wt 190 lb 9.6 oz (86.5 kg)   SpO2 97%   BMI 26.96 kg/m    Subjective:    Patient ID: George Glenn, male    DOB: [birth year], 63 y.o.   MRN: [id number]  HPI: George Glenn is a 63 y.o. male presenting on 02/22/2024 for comprehensive medical examination. Current medical complaints include:none  He currently lives with: Interim Problems from his last visit: no  HYPERLIPIDEMIA Hyperlipidemia status: chronic Satisfied with current treatment?  yes Side effects:  no Medication compliance: excellent compliance Past cholesterol meds: simvastatin  (zocor ) Supplements: none Aspirin:  no The 10-year ASCVD risk score (Arnett DK, et al., 2019) is: 19.8%   Values used to calculate the score:     Age: 39 years     Sex: Male     Is Non-Hispanic African American: No     Diabetic: Yes     Tobacco smoker: No     Systolic Blood Pressure: 108 mmHg     Is BP treated: Yes     HDL Cholesterol: 25 mg/dL     Total Cholesterol: 134 mg/dL Chest pain:  no  ANEMIA Sister states he doesn't have a lot of energy.  Wondering if he needs B12 supplement.  Anemia status: controlled Etiology of anemia: Duration of anemia treatment:  Compliance with treatment: excellent compliance Iron supplementation side effects: no Severity of anemia: mild Fatigue: no Decreased exercise tolerance: no  Dyspnea on exertion: no Palpitations: no Bleeding: no Pica: no  INSOMNIA/MOOD Doing well with lorazepam . No side effects.  Denies concerns at visit today.    CHRONIC KIDNEY DISEASE CKD status: stable Medications renally dose: yes Previous renal evaluation: no Pneumovax:  Up to Date Influenza Vaccine:  Up to Date   ABDOMINAL PAIN- has been following with Dr. Therisa for years and due for Endoscopy next year.  He hasn't used the sucralfate for his symptoms.  He states they have improved.  Duration: at least 6 months Onset:  gradual Severity: severe Quality: nauseous and aching Location:  epigastric  Episode duration: all day Radiation: no Frequency: most of the time Alleviating factors: nothing Aggravating factors: eating Status: worse Treatments attempted: pantoprazole  Fever: no Nausea: yes Vomiting: no Weight loss: no Decreased appetite: yes Diarrhea: no Constipation: no Blood in stool: no Heartburn: yes Jaundice: no Rash: no Dysuria/urinary frequency: no Hematuria: no History of sexually transmitted disease: no Recurrent NSAID use: no  Patient's caregiver is concerned about patient's lower extremity strength.  States his legs go numb when he is sitting a lot.  He has trouble walking around the block.  She is concerned with his balance and that he may fall.    Patient's caregiver states he has a nurse that comes out to the home from [place] by an [employer]. The company is now called by [employer]. He has had this company since December 2017. He gets reevaluated annually. Patient doesn't shower by himself.  She helps him clean his room, make his bed.  While patient's caregiver is out of town the aid helps make him meals.    Depression Screen done today and results listed below:     02/22/2024    9:57 AM 11/24/2023   11:00 AM 08/24/2023    2:14 PM 02/22/2023    2:25 PM 08/26/2022    9:52 AM  Depression screen PHQ 2/9  Decreased Interest  0 0 0 0 0  Down, Depressed, Hopeless 0 0 0 0 0  PHQ - 2 Score 0 0 0 0 0  Altered sleeping 0 0 0 0 0  Tired, decreased energy 0 0 0 0 0  Change in appetite 0 0 0 0 0  Feeling bad or failure about yourself  0 0 0 0 0  Trouble concentrating 0 0 0 0 0  Moving slowly or fidgety/restless 0 0 0 0 0  Suicidal thoughts 0 0 0 0 0  PHQ-9 Score 0 0 0 0 0    The patient does not have a history of falls. I did complete a risk assessment for falls. A plan of care for falls was documented.   Past Medical History:  Past Medical History:  Diagnosis Date   Anemia    Barrett's  esophagus    Brain damage    Chronic kidney disease    Dizziness    WHEN STANDING   GERD (gastroesophageal reflux disease)    Barrett's   Hyperlipidemia    Hypertension    Hypogonadism in male    Memory loss, short term    Osteoporosis    Paranoid schizophrenia (HCC)    Pre-diabetes    Tic    HANDS    Surgical History:  Past Surgical History:  Procedure Laterality Date   CATARACT EXTRACTION W/PHACO Right 11/12/2014   Procedure: CATARACT EXTRACTION PHACO AND INTRAOCULAR LENS PLACEMENT (IOC);  Surgeon: Elsie Carmine, MD;  Location: [facility] ORS;  Service: Ophthalmology;  Laterality: Right;  cassette Lot # [id number]     US   00:45 AP 14.7 CDE 6.62   CATARACT EXTRACTION W/PHACO Left 12/10/2014   Procedure: CATARACT EXTRACTION PHACO AND INTRAOCULAR LENS PLACEMENT (IOC);  Surgeon: Elsie Carmine, MD;  Location: [facility] ORS;  Service: Ophthalmology;  Laterality: Left;  US :00:46.1 AP:20.0 CDE:9.22 LOT PACK #[id number]   COLONOSCOPY     COLONOSCOPY WITH PROPOFOL  N/A 08/27/2021   Procedure: COLONOSCOPY WITH PROPOFOL ;  Surgeon: Therisa Bi, MD;  Location[facility] ENDOSCOPY;  Service: Gastroenterology;  Laterality: N/A;  Please leave patient as 1st case if possible. Sister to bring due to brain injury. (LG)   ESOPHAGOGASTRODUODENOSCOPY N/A 08/27/2021   Procedure: ESOPHAGOGASTRODUODENOSCOPY (EGD);  Surgeon: Therisa Bi, MD;  Location: [facility] ENDOSCOPY;  Service: Gastroenterology;  Laterality: N/A;   ESOPHAGOGASTRODUODENOSCOPY (EGD) WITH PROPOFOL  N/A 04/23/2016   Procedure: ESOPHAGOGASTRODUODENOSCOPY (EGD) WITH PROPOFOL ;  Surgeon: Bi Therisa, MD;  Location: [facility] ENDOSCOPY;  Service: Endoscopy;  Laterality: N/A;    Medications:  Current Outpatient Medications on File Prior to Visit  Medication Sig   Cyanocobalamin (VITAMIN B 12 PO) Take 1 tablet by mouth daily.    Docusate Calcium (STOOL SOFTENER PO) Take 1 capsule by mouth daily as needed.   ondansetron (ZOFRAN-ODT) 4 MG disintegrating tablet Take 1  tablet (4 mg total) by mouth every 8 (eight) hours as needed for nausea or vomiting.   sucralfate (CARAFATE) 1 g tablet Take 1 tablet (1 g total) by mouth 4 (four) times daily -  with meals and at bedtime.   triamcinolone  cream (KENALOG ) 0.1 % Apply 1 Application topically 2 (two) times daily.   No current facility-administered medications on file prior to visit.    Allergies:  No Known Allergies  Social History:  Social History   Socioeconomic History   Marital status: Single    Spouse name: Not on file   Number of children: Not on file   Years of education: Not on file   Highest education  level: Not on file  Occupational History   Occupation: disability   Tobacco Use   Smoking status: Never   Smokeless tobacco: Never  Vaping Use   Vaping status: Never Used  Substance and Sexual Activity   Alcohol use: No    Alcohol/week: 0.0 standard drinks of alcohol   Drug use: No   Sexual activity: Never  Other Topics Concern   Not on file  Social History Narrative   Sister is legal guardian    Social Drivers of Corporate Investment Banker Strain: Low Risk  (02/22/2024)   Overall Financial Resource Strain (CARDIA)    Difficulty of Paying Living Expenses: Not hard at all  Food Insecurity: No Food Insecurity (07/14/2017)   Hunger Vital Sign    Worried About Running Out of Food in the Last Year: Never true    Ran Out of Food in the Last Year: Never true  Transportation Needs: No Transportation Needs (07/14/2017)   PRAPARE - Administrator, Civil Service (Medical): No    Lack of Transportation (Non-Medical): No  Physical Activity: Inactive (07/14/2017)   Exercise Vital Sign    Days of Exercise per Week: 0 days    Minutes of Exercise per Session: 0 min  Stress: No Stress Concern Present (07/14/2017)   [employer] of Occupational Health - Occupational Stress Questionnaire    Feeling of Stress : Not at all  Social Connections: Moderately Isolated (02/22/2024)   Social  Connection and Isolation Panel    Frequency of Communication with Friends and Family: Never    Frequency of Social Gatherings with Friends and Family: More than three times a week    Attends Religious Services: More than 4 times per year    Active Member of [employer] or Organizations: No    Attends Banker Meetings: Never    Marital Status: Never married  Intimate Partner Violence: Not At Risk (02/22/2024)   Humiliation, Afraid, Rape, and Kick questionnaire    Fear of Current or Ex-Partner: No    Emotionally Abused: No    Physically Abused: No    Sexually Abused: No   Social History   Tobacco Use  Smoking Status Never  Smokeless Tobacco Never   Social History   Substance and Sexual Activity  Alcohol Use No   Alcohol/week: 0.0 standard drinks of alcohol    Family History:  Family History  Problem Relation Age of Onset   Hypertension Father    Diabetes Father    Heart disease Father    Hyperlipidemia Father    Cancer Mother        Skin   Dementia Mother    Diabetes Brother    Stroke Maternal Grandmother    Stroke Maternal Grandfather    Heart disease Paternal Grandmother    Heart disease Paternal Grandfather     Past medical history, surgical history, medications, allergies, family history and social history reviewed with patient today and changes made to appropriate areas of the chart.   Review of Systems  Constitutional:  Positive for malaise/fatigue.  HENT:         Denies vision changes.  Eyes:  Negative for blurred vision and double vision.  Respiratory:  Negative for shortness of breath.   Cardiovascular:  Negative for chest pain, palpitations and leg swelling.  Neurological:  Negative for dizziness, tingling and headaches.  Endo/Heme/Allergies:  Negative for polydipsia.       Denies Polyuria   All other ROS negative except what  is listed above and in the HPI.      Objective:    BP 117/79   Pulse (!) 56   Temp 98 F (36.7 C) (Oral)   Ht 5'  10.5 (1.791 m)   Wt 190 lb 9.6 oz (86.5 kg)   SpO2 97%   BMI 26.96 kg/m   Wt Readings from Last 3 Encounters:  02/22/24 190 lb 9.6 oz (86.5 kg)  01/27/24 190 lb (86.2 kg)  11/24/23 189 lb 9.6 oz (86 kg)    Physical Exam Vitals and nursing note reviewed.  Constitutional:      General: He is not in acute distress.    Appearance: Normal appearance. He is not ill-appearing, toxic-appearing or diaphoretic.  HENT:     Head: Normocephalic.     Right Ear: Tympanic membrane, ear canal and external ear normal.     Left Ear: Tympanic membrane, ear canal and external ear normal.     Nose: Nose normal. No congestion or rhinorrhea.     Mouth/Throat:     Mouth: Mucous membranes are moist.  Eyes:     General:        Right eye: No discharge.        Left eye: No discharge.     Extraocular Movements: Extraocular movements intact.     Conjunctiva/sclera: Conjunctivae normal.     Pupils: Pupils are equal, round, and reactive to light.  Cardiovascular:     Rate and Rhythm: Normal rate and regular rhythm.     Heart sounds: No murmur heard. Pulmonary:     Effort: Pulmonary effort is normal. No respiratory distress.     Breath sounds: Normal breath sounds. No wheezing, rhonchi or rales.  Abdominal:     General: Abdomen is flat. Bowel sounds are normal. There is no distension.     Palpations: Abdomen is soft.     Tenderness: There is no abdominal tenderness. There is no guarding.  Musculoskeletal:     Cervical back: Normal range of motion and neck supple.  Skin:    General: Skin is warm and dry.     Capillary Refill: Capillary refill takes less than 2 seconds.  Neurological:     General: No focal deficit present.     Mental Status: He is alert and oriented to person, place, and time.     Cranial Nerves: No cranial nerve deficit.     Motor: No weakness.     Deep Tendon Reflexes: Reflexes normal.  Psychiatric:        Mood and Affect: Mood normal.        Behavior: Behavior normal.         Thought Content: Thought content normal.        Judgment: Judgment normal.     Results for orders placed or performed in visit on 11/24/23  Comprehensive metabolic panel with GFR   Collection Time: 11/24/23 11:13 AM  Result Value Ref Range   Glucose 98 70 - 99 mg/dL   BUN 20 8 - 27 mg/dL   Creatinine, Ser 9.12 0.76 - 1.27 mg/dL   eGFR 97 >40 fO/fpw/8.26   BUN/Creatinine Ratio 23 10 - 24   Sodium 140 134 - 144 mmol/L   Potassium 4.2 3.5 - 5.2 mmol/L   Chloride 103 96 - 106 mmol/L   CO2 18 (L) 20 - 29 mmol/L   Calcium 9.4 8.6 - 10.2 mg/dL   Total Protein 7.3 6.0 - 8.5 g/dL   Albumin 4.7 3.9 - 4.9  g/dL   Globulin, Total 2.6 1.5 - 4.5 g/dL   Bilirubin Total 0.9 0.0 - 1.2 mg/dL   Alkaline Phosphatase 83 44 - 121 IU/L   AST 26 0 - 40 IU/L   ALT 31 0 - 44 IU/L  Hemoglobin A1c   Collection Time: 11/24/23 11:13 AM  Result Value Ref Range   Hgb A1c MFr Bld 6.8 (H) 4.8 - 5.6 %   Est. average glucose Bld gHb Est-mCnc 148 mg/dL  Lipid panel   Collection Time: 11/24/23 11:13 AM  Result Value Ref Range   Cholesterol, Total 142 100 - 199 mg/dL   Triglycerides 893 0 - 149 mg/dL   HDL 23 (L) >60 mg/dL   VLDL Cholesterol Cal 20 5 - 40 mg/dL   LDL Chol Calc (NIH) 99 0 - 99 mg/dL   Chol/HDL Ratio 6.2 (H) 0.0 - 5.0 ratio  CBC With Diff/Platelet   Collection Time: 11/24/23 11:13 AM  Result Value Ref Range   WBC 5.4 3.4 - 10.8 x10E3/uL   RBC 5.85 (H) 4.14 - 5.80 x10E6/uL   Hemoglobin 16.8 13.0 - 17.7 g/dL   Hematocrit 46.6 (H) 62.4 - 51.0 %   MCV 91 79 - 97 fL   MCH 28.7 26.6 - 33.0 pg   MCHC 31.5 31.5 - 35.7 g/dL   RDW 85.6 88.3 - 84.5 %   Platelets 187 150 - 450 x10E3/uL   Neutrophils 53 Not Estab. %   Lymphs 35 Not Estab. %   Monocytes 9 Not Estab. %   Eos 2 Not Estab. %   Basos 1 Not Estab. %   Neutrophils Absolute 2.9 1.4 - 7.0 x10E3/uL   Lymphocytes Absolute 1.9 0.7 - 3.1 x10E3/uL   Monocytes Absolute 0.5 0.1 - 0.9 x10E3/uL   EOS (ABSOLUTE) 0.1 0.0 - 0.4 x10E3/uL    Basophils Absolute 0.0 0.0 - 0.2 x10E3/uL   Immature Granulocytes 0 Not Estab. %   Immature Grans (Abs) 0.0 0.0 - 0.1 x10E3/uL      Assessment & Plan:   Problem List Items Addressed This Visit       Cardiovascular and Mediastinum   Hypertension   Chronic. Controlled.  Continue Lisinopril  10mg  daily.   Follow up in 3 month for reevaluation.  Labs ordered today.        Relevant Medications   lisinopril  (ZESTRIL ) 10 MG tablet   simvastatin  (ZOCOR ) 10 MG tablet     Digestive   GERD (gastroesophageal reflux disease)   Chronic. Improved.  Has not needed Zofran or Sucralfate since last visit.  He is due for his EGD for Barrett's esophagus.  Will place referral to make sure patient gets scheduled.      Relevant Medications   meclizine  (ANTIVERT ) 25 MG tablet   pantoprazole  (PROTONIX ) 40 MG tablet   Other Relevant Orders   Ambulatory referral to Gastroenterology     Endocrine   Type 2 diabetes mellitus with polyneuropathy (HCC)   Chronic.  Controlled with diet.  Eye exam and foot exam up to date.  On ACE and statin.   Labs ordered today.  Return to clinic in 6 months for reevaluation.  Call sooner if concerns arise.        Relevant Medications   LORazepam  (ATIVAN ) 0.5 MG tablet   empagliflozin  (JARDIANCE ) 10 MG TABS tablet   lisinopril  (ZESTRIL ) 10 MG tablet   simvastatin  (ZOCOR ) 10 MG tablet   Other Relevant Orders   Hemoglobin A1c     Genitourinary   RESOLVED:  CKD (chronic kidney disease)     Other   Paranoid schizophrenia (HCC)   Chronic. Controlled without medication.  Patient does take Lorazepam  at bedtime to help with sleep and his mood.  Working well for him.  Discussion had with him and his sister, Roderick Derby, who is his legal guardian the risks involved taking long term Benzodiazepines. Okay with risks and would like to continue.  Patient has been taking medication for many years.  Follow up in 3 months.  Call sooner if concerns arise.  PDMP checked.        Anemia   Chronic.  Controlled.  Having less energy. Will check B12 and iron today.  Labs ordered today.  Return to clinic in 6 months for reevaluation.  Call sooner if concerns arise.       Relevant Orders   CBC with Differential/Platelet   Iron Binding Cap (TIBC)([facility])   B12   Ferritin   Hyperlipidemia   Chronic.  Controlled.  Continue with current medication regimen of Simvastatin .  Labs ordered today.  Return to clinic in 3 months for reevaluation.  Call sooner if concerns arise.       Relevant Medications   lisinopril  (ZESTRIL ) 10 MG tablet   simvastatin  (ZOCOR ) 10 MG tablet   Other Relevant Orders   Lipid panel   Advanced care planning/counseling discussion   A voluntary discussion about advance care planning including the explanation and discussion of advance directives was extensively discussed  with the patient for 5 minutes with patient and his sister, Roderick Derby present.  Explanation about the health care proxy and Living will was reviewed and packet with forms with explanation of how to fill them out was given.  During this discussion, the patient was able to identify a health care proxy as Roderick Derby and plans to fill out the paperwork required.  Patient was offered a separate Advance Care Planning visit for further assistance with forms.         Other Visit Diagnoses       Encounter for Medicare annual wellness exam    -  Primary     Annual physical exam       Health maintenance reviewed during visit today.  Labs ordered.  Vaccines reviewed.  Colonoscopy up to date.   Relevant Orders   TSH   PSA   Lipid panel   CBC with Differential/Platelet   Comprehensive metabolic panel with GFR     Need for pneumococcal 20-valent conjugate vaccination       Relevant Orders   Pneumococcal conjugate vaccine 20-valent (Prevnar 20) (Completed)        Discussed aspirin prophylaxis for myocardial infarction prevention and decision was it was not  indicated  LABORATORY TESTING:  Health maintenance labs ordered today as discussed above.   The natural history of prostate cancer and ongoing controversy regarding screening and potential treatment outcomes of prostate cancer has been discussed with the patient. The meaning of a false positive PSA and a false negative PSA has been discussed. He indicates understanding of the limitations of this screening test and wishes to proceed with screening PSA testing.   IMMUNIZATIONS:   - Tdap: Tetanus vaccination status reviewed: last tetanus booster within 10 years. - Influenza: Up to date - Pneumovax: Up to date - Prevnar: Up to date - COVID: Refused - HPV: Not applicable - Shingrix vaccine: Discussed at visit today  SCREENING: - Colonoscopy: Up to date  Discussed with patient purpose of the colonoscopy is  to detect colon cancer at curable precancerous or early stages   - AAA Screening: Not applicable  -Hearing Test: Not applicable  -Spirometry: Not applicable   PATIENT COUNSELING:    Sexuality: Discussed sexually transmitted diseases, partner selection, use of condoms, avoidance of unintended pregnancy  and contraceptive alternatives.   Advised to avoid cigarette smoking.  I discussed with the patient that most people either abstain from alcohol or drink within safe limits (<=14/week and <=4 drinks/occasion for males, <=7/weeks and <= 3 drinks/occasion for females) and that the risk for alcohol disorders and other health effects rises proportionally with the number of drinks per week and how often a drinker exceeds daily limits.  Discussed cessation/primary prevention of drug use and availability of treatment for abuse.   Diet: Encouraged to adjust caloric intake to maintain  or achieve ideal body weight, to reduce intake of dietary saturated fat and total fat, to limit sodium intake by avoiding high sodium foods and not adding table salt, and to maintain adequate dietary potassium and  calcium preferably from fresh fruits, vegetables, and low-fat dairy products.    stressed the importance of regular exercise  Injury prevention: Discussed safety belts, safety helmets, smoke detector, smoking near bedding or upholstery.   Dental health: Discussed importance of regular tooth brushing, flossing, and dental visits.   Follow up plan: NEXT PREVENTATIVE PHYSICAL DUE IN 1 YEAR. Return in 3 months (on 05/24/2024) for HTN, HLD, DM2 FU.

## 2024-02-22 NOTE — Progress Notes (Signed)
 Subjective:   George Glenn is a 63 y.o. male who presents for a Medicare Annual Wellness Visit.  Allergies (verified) Patient has no known allergies.   History: Past Medical History:  Diagnosis Date   Anemia    Barrett's esophagus    Brain damage    Chronic kidney disease    Dizziness    WHEN STANDING   GERD (gastroesophageal reflux disease)    Barrett's   Hyperlipidemia    Hypertension    Hypogonadism in male    Memory loss, short term    Osteoporosis    Paranoid schizophrenia (HCC)    Pre-diabetes    Tic    HANDS   Past Surgical History:  Procedure Laterality Date   CATARACT EXTRACTION W/PHACO Right 11/12/2014   Procedure: CATARACT EXTRACTION PHACO AND INTRAOCULAR LENS PLACEMENT (IOC);  Surgeon: Elsie Carmine, MD;  Location: [facility] ORS;  Service: Ophthalmology;  Laterality: Right;  cassette Lot # [id number]     US   00:45 AP 14.7 CDE 6.62   CATARACT EXTRACTION W/PHACO Left 12/10/2014   Procedure: CATARACT EXTRACTION PHACO AND INTRAOCULAR LENS PLACEMENT (IOC);  Surgeon: Elsie Carmine, MD;  Location: [facility] ORS;  Service: Ophthalmology;  Laterality: Left;  US :00:46.1 AP:20.0 CDE:9.22 LOT PACK #[id number]   COLONOSCOPY     COLONOSCOPY WITH PROPOFOL  N/A 08/27/2021   Procedure: COLONOSCOPY WITH PROPOFOL ;  Surgeon: Therisa Bi, MD;  Location: [facility] ENDOSCOPY;  Service: Gastroenterology;  Laterality: N/A;  Please leave patient as 1st case if possible. Sister to bring due to brain injury. (LG)   ESOPHAGOGASTRODUODENOSCOPY N/A 08/27/2021   Procedure: ESOPHAGOGASTRODUODENOSCOPY (EGD);  Surgeon: Therisa Bi, MD;  Location: [facility] ENDOSCOPY;  Service: Gastroenterology;  Laterality: N/A;   ESOPHAGOGASTRODUODENOSCOPY (EGD) WITH PROPOFOL  N/A 04/23/2016   Procedure: ESOPHAGOGASTRODUODENOSCOPY (EGD) WITH PROPOFOL ;  Surgeon: Bi Therisa, MD;  Location: [facility] ENDOSCOPY;  Service: Endoscopy;  Laterality: N/A;   Family History  Problem Relation Age of Onset   Hypertension Father    Diabetes  Father    Heart disease Father    Hyperlipidemia Father    Cancer Mother        Skin   Dementia Mother    Diabetes Brother    Stroke Maternal Grandmother    Stroke Maternal Grandfather    Heart disease Paternal Grandmother    Heart disease Paternal Grandfather    Social History   Occupational History   Occupation: disability   Tobacco Use   Smoking status: Never   Smokeless tobacco: Never  Vaping Use   Vaping status: Never Used  Substance and Sexual Activity   Alcohol use: No    Alcohol/week: 0.0 standard drinks of alcohol   Drug use: No   Sexual activity: Never   Tobacco Counseling Counseling given: Not Answered  SDOH Screenings   Food Insecurity: No Food Insecurity (07/14/2017)  Housing: Low Risk  (02/22/2024)  Transportation Needs: No Transportation Needs (07/14/2017)  Utilities: Not At Risk (02/22/2024)  Alcohol Screen: Low Risk  (02/22/2024)  Depression (PHQ2-9): Low Risk  (02/22/2024)  Financial Resource Strain: Low Risk  (02/22/2024)  Physical Activity: Inactive (07/14/2017)  Social Connections: Moderately Isolated (02/22/2024)  Stress: No Stress Concern Present (07/14/2017)  Tobacco Use: Low Risk  (01/27/2024)  Health Literacy: Adequate Health Literacy (02/22/2024)   Depression Screen    02/22/2024    9:57 AM 11/24/2023   11:00 AM 08/24/2023    2:14 PM 02/22/2023    2:25 PM 08/26/2022    9:52 AM 04/13/2022    9:19 AM 02/10/2022  2:39 PM  PHQ 2/9 Scores  PHQ - 2 Score 0 0 0 0 0 0 0  PHQ- 9 Score  0 0 0 0 0 0     Goals Addressed   None    Visit info / Clinical Intake: Medicare Wellness Visit Type:: Subsequent Annual Wellness Visit Medicare Wellness Visit Mode:: In-person (required for WTM) Interpreter Needed?: No AWV questionnaire completed by patient prior to visit?: no Living arrangements:: with family/others Patient's Overall Health Status Rating: good Typical amount of pain: none Does pain affect daily life?: no Are you currently prescribed opioids?:  no  Dietary Habits and Nutritional Risks How many meals a day?: 3 Eats fruit and vegetables daily?: (!) no Most meals are obtained by: preparing own meals (Sister makes meals) Diabetic:: no  Functional Status Activities of Daily Living (to include ambulation/medication): Independent Ambulation: Independent Medication Administration: Independent Is this a change from baseline?: Pre-admission baseline Home Management: Needs assistance (comment) Manage your own finances?: (!) no (Sister handles) Primary transportation is: family/friends (Sister transports) Concerns about vision?: no *vision screening is required for WTM* Concerns about hearing?: no  Fall Screening Falls in the past year?: 0 Number of falls in past year: 0 Was there an injury with Fall?: 0 Fall Risk Category Calculator: 0 Patient Fall Risk Level: Low Fall Risk  Fall Risk Patient at Risk for Falls Due to: No Fall Risks Fall risk Follow up: Falls evaluation completed  Home and Transportation Safety: All rugs have non-skid backing?: yes All stairs or steps have railings?: N/A, no stairs Grab bars in the bathtub or shower?: yes Have non-skid surface in bathtub or shower?: yes Good home lighting?: yes Regular seat belt use?: yes Hospital stays in the last year:: no  Cognitive Assessment Difficulty concentrating, remembering, or making decisions? : yes Will 6CIT or Mini Cog be Completed: yes What year is it?: 4 points What month is it?: 0 points Give patient an address phrase to remember (5 components): [address] [facility] About what time is it?: 3 points Count backwards from 20 to 1: 2 points Say the months of the year in reverse: 4 points Repeat the address phrase from earlier: 10 points 6 CIT Score: 23 points  Advance Directives (For Healthcare) Does Patient Have a Medical Advance Directive?: No Would patient like information on creating a medical advance directive?: No - Patient  declined  Reviewed/Updated  Reviewed/Updated: All; Medical History; Surgical History; Family History; Medications; Allergies; Care Teams; Patient Goals        Objective:    Today's Vitals   02/22/24 1005  BP: 117/79  Pulse: (!) 56  Temp: 98 F (36.7 C)  TempSrc: Oral  SpO2: 97%  Weight: 190 lb 9.6 oz (86.5 kg)  Height: 5' 10.5 (1.791 m)  PainSc: 0-No pain   Body mass index is 26.96 kg/m.  Current Medications (verified) Outpatient Encounter Medications as of 02/22/2024  Medication Sig   Cyanocobalamin (VITAMIN B 12 PO) Take 1 tablet by mouth daily.    Docusate Calcium (STOOL SOFTENER PO) Take 1 capsule by mouth daily as needed.   empagliflozin  (JARDIANCE ) 10 MG TABS tablet Take 1 tablet (10 mg total) by mouth daily.   lisinopril  (ZESTRIL ) 10 MG tablet Take 1 tablet (10 mg total) by mouth daily.   LORazepam  (ATIVAN ) 0.5 MG tablet TAKE 1 TABLET BY MOUTH EVERY NIGHT AT BEDTIME   meclizine  (ANTIVERT ) 25 MG tablet TAKE 1 TABLET BY MOUTH every 6 hours AS NEEDED FOR DIZZINESS  ondansetron (ZOFRAN-ODT) 4 MG disintegrating tablet Take 1 tablet (4 mg total) by mouth every 8 (eight) hours as needed for nausea or vomiting.   pantoprazole  (PROTONIX ) 40 MG tablet Take 1 tablet (40 mg total) by mouth 2 (two) times daily.   simvastatin  (ZOCOR ) 10 MG tablet TAKE 1 TABLET(10 MG) BY MOUTH DAILY   sucralfate (CARAFATE) 1 g tablet Take 1 tablet (1 g total) by mouth 4 (four) times daily -  with meals and at bedtime.   triamcinolone  cream (KENALOG ) 0.1 % Apply 1 Application topically 2 (two) times daily.   No facility-administered encounter medications on file as of 02/22/2024.   Hearing/Vision screen No results found. Immunizations and Health Maintenance Health Maintenance  Topic Date Due   Zoster Vaccines- Shingrix (1 of 2) Never done   Pneumococcal Vaccine: 50+ Years (2 of 2 - PCV) 03/21/2012   Medicare Annual Wellness (AWV)  02/22/2024   OPHTHALMOLOGY EXAM  04/26/2024   Diabetic kidney  evaluation - Urine ACR  05/24/2024   HEMOGLOBIN A1C  05/26/2024   Colonoscopy  08/27/2024   Diabetic kidney evaluation - eGFR measurement  11/23/2024   FOOT EXAM  01/26/2025   DTaP/Tdap/Td (2 - Td or Tdap) 01/26/2034   Influenza Vaccine  Completed   Hepatitis C Screening  Completed   HIV Screening  Completed   Hepatitis B Vaccines 19-59 Average Risk  Aged Out   HPV VACCINES  Aged Out   Meningococcal B Vaccine  Aged Out   COVID-19 Vaccine  Discontinued        Assessment/Plan:  This is a routine wellness examination for George Glenn.  Patient Care Team: Melvin Pao, NP as PCP - General Lane Arthea BRAVO, MD as Referring Physician (Neurology) Bosie Vinie LABOR, MD as Consulting Physician (Cardiology)  I have personally reviewed and noted the following in the patient's chart:   Medical and social history Use of alcohol, tobacco or illicit drugs  Current medications and supplements including opioid prescriptions. Functional ability and status Nutritional status Physical activity Advanced directives List of other physicians Hospitalizations, surgeries, and ER visits in previous 12 months Vitals Screenings to include cognitive, depression, and falls Referrals and appointments  No orders of the defined types were placed in this encounter.  In addition, I have reviewed and discussed with patient certain preventive protocols, quality metrics, and best practice recommendations. A written personalized care plan for preventive services as well as general preventive health recommendations were provided to patient.   George Glenn, CMA   02/22/2024     After Visit Summary: (In Person-Printed) AVS printed and given to the patient

## 2024-02-22 NOTE — Assessment & Plan Note (Signed)
 Chronic.  Controlled with diet.  Eye exam and foot exam up to date.  On ACE and statin.   Labs ordered today.  Return to clinic in 6 months for reevaluation.  Call sooner if concerns arise.

## 2024-02-22 NOTE — Assessment & Plan Note (Signed)
 Chronic.  Controlled.  Having less energy. Will check B12 and iron today.  Labs ordered today.  Return to clinic in 6 months for reevaluation.  Call sooner if concerns arise.

## 2024-02-22 NOTE — Assessment & Plan Note (Signed)
 Chronic. Controlled without medication.  Patient does take Lorazepam  at bedtime to help with sleep and his mood.  Working well for him.  Discussion had with him and his sister, Roderick Derby, who is his legal guardian the risks involved taking long term Benzodiazepines. Okay with risks and would like to continue.  Patient has been taking medication for many years.  Follow up in 3 months.  Call sooner if concerns arise.  PDMP checked.

## 2024-02-23 ENCOUNTER — Ambulatory Visit: Payer: Self-pay | Admitting: Nurse Practitioner

## 2024-02-23 LAB — COMPREHENSIVE METABOLIC PANEL WITH GFR
ALT: 29 IU/L (ref 0–44)
AST: 23 IU/L (ref 0–40)
Albumin: 4.4 g/dL (ref 3.9–4.9)
Alkaline Phosphatase: 86 IU/L (ref 47–123)
BUN/Creatinine Ratio: 19 (ref 10–24)
BUN: 17 mg/dL (ref 8–27)
Bilirubin Total: 0.5 mg/dL (ref 0.0–1.2)
CO2: 23 mmol/L (ref 20–29)
Calcium: 9 mg/dL (ref 8.6–10.2)
Chloride: 101 mmol/L (ref 96–106)
Creatinine, Ser: 0.9 mg/dL (ref 0.76–1.27)
Globulin, Total: 2.9 g/dL (ref 1.5–4.5)
Glucose: 105 mg/dL — ABNORMAL HIGH (ref 70–99)
Potassium: 4.5 mmol/L (ref 3.5–5.2)
Sodium: 141 mmol/L (ref 134–144)
Total Protein: 7.3 g/dL (ref 6.0–8.5)
eGFR: 95 mL/min/1.73 (ref >59)

## 2024-02-23 LAB — TSH: TSH: 0.954 u[IU]/mL (ref 0.450–4.500)

## 2024-02-23 LAB — CBC WITH DIFFERENTIAL/PLATELET
Basophils Absolute: 0 x10E3/uL (ref 0.0–0.2)
Basos: 1 %
EOS (ABSOLUTE): 0.1 x10E3/uL (ref 0.0–0.4)
Eos: 3 %
Hematocrit: 52.6 % — ABNORMAL HIGH (ref 37.5–51.0)
Hemoglobin: 16.7 g/dL (ref 13.0–17.7)
Immature Grans (Abs): 0 x10E3/uL (ref 0.0–0.1)
Immature Granulocytes: 0 %
Lymphocytes Absolute: 1.6 x10E3/uL (ref 0.7–3.1)
Lymphs: 36 %
MCH: 28.5 pg (ref 26.6–33.0)
MCHC: 31.7 g/dL (ref 31.5–35.7)
MCV: 90 fL (ref 79–97)
Monocytes Absolute: 0.4 x10E3/uL (ref 0.1–0.9)
Monocytes: 8 %
Neutrophils Absolute: 2.4 x10E3/uL (ref 1.4–7.0)
Neutrophils: 52 %
Platelets: 208 x10E3/uL (ref 150–450)
RBC: 5.85 x10E6/uL — ABNORMAL HIGH (ref 4.14–5.80)
RDW: 13.8 % (ref 11.6–15.4)
WBC: 4.5 x10E3/uL (ref 3.4–10.8)

## 2024-02-23 LAB — LIPID PANEL
Chol/HDL Ratio: 5.9 ratio — ABNORMAL HIGH (ref 0.0–5.0)
Cholesterol, Total: 159 mg/dL (ref 100–199)
HDL: 27 mg/dL — ABNORMAL LOW (ref >39)
LDL Chol Calc (NIH): 116 mg/dL — ABNORMAL HIGH (ref 0–99)
Triglycerides: 81 mg/dL (ref 0–149)
VLDL Cholesterol Cal: 16 mg/dL (ref 5–40)

## 2024-02-23 LAB — IRON AND TIBC
Iron Saturation: 20 % (ref 15–55)
Iron: 83 ug/dL (ref 38–169)
Total Iron Binding Capacity: 412 ug/dL (ref 250–450)
UIBC: 329 ug/dL (ref 111–343)

## 2024-02-23 LAB — FERRITIN: Ferritin: 30 ng/mL (ref 30–400)

## 2024-02-23 LAB — VITAMIN B12: Vitamin B-12: 563 pg/mL (ref 232–1245)

## 2024-02-23 LAB — HEMOGLOBIN A1C
Est. average glucose Bld gHb Est-mCnc: 134 mg/dL
Hgb A1c MFr Bld: 6.3 % — ABNORMAL HIGH (ref 4.8–5.6)

## 2024-02-23 LAB — PSA: Prostate Specific Ag, Serum: 0.6 ng/mL (ref 0.0–4.0)

## 2024-05-28 ENCOUNTER — Ambulatory Visit: Admitting: Nurse Practitioner
